# Patient Record
Sex: Female | Born: 1955
Health system: Southern US, Community
[De-identification: ages and names within clinical notes are randomized; demographics above are authoritative.]

## PROBLEM LIST (undated history)

## (undated) DIAGNOSIS — I10 Essential (primary) hypertension: Secondary | ICD-10-CM

## (undated) DIAGNOSIS — I219 Acute myocardial infarction, unspecified: Secondary | ICD-10-CM

## (undated) DIAGNOSIS — E785 Hyperlipidemia, unspecified: Secondary | ICD-10-CM

## (undated) DIAGNOSIS — F410 Panic disorder [episodic paroxysmal anxiety] without agoraphobia: Secondary | ICD-10-CM

## (undated) DIAGNOSIS — K219 Gastro-esophageal reflux disease without esophagitis: Secondary | ICD-10-CM

## (undated) DIAGNOSIS — F411 Generalized anxiety disorder: Secondary | ICD-10-CM

## (undated) DIAGNOSIS — Z720 Tobacco use: Secondary | ICD-10-CM

## (undated) HISTORY — DX: Acute myocardial infarction, unspecified: I21.9

## (undated) HISTORY — DX: Tobacco use: Z72.0

## (undated) HISTORY — PX: COLON SURGERY: SHX602

## (undated) HISTORY — DX: Generalized anxiety disorder: F41.1

## (undated) HISTORY — PX: TONSILLECTOMY: SUR1361

## (undated) HISTORY — DX: Essential (primary) hypertension: I10

## (undated) HISTORY — DX: Hyperlipidemia, unspecified: E78.5

## (undated) HISTORY — DX: Gastro-esophageal reflux disease without esophagitis: K21.9

## (undated) HISTORY — PX: APPENDECTOMY: SHX54

---

## 1998-09-05 ENCOUNTER — Other Ambulatory Visit: Admission: RE | Admit: 1998-09-05 | Discharge: 1998-09-05 | Payer: Self-pay | Admitting: *Deleted

## 2001-10-14 ENCOUNTER — Emergency Department (HOSPITAL_COMMUNITY): Admission: EM | Admit: 2001-10-14 | Discharge: 2001-10-14 | Payer: Self-pay | Admitting: Emergency Medicine

## 2001-10-14 ENCOUNTER — Encounter: Payer: Self-pay | Admitting: Emergency Medicine

## 2006-09-17 ENCOUNTER — Emergency Department (HOSPITAL_COMMUNITY): Admission: EM | Admit: 2006-09-17 | Discharge: 2006-09-17 | Payer: Self-pay | Admitting: Emergency Medicine

## 2006-09-30 ENCOUNTER — Ambulatory Visit (HOSPITAL_COMMUNITY): Admission: RE | Admit: 2006-09-30 | Discharge: 2006-09-30 | Payer: Self-pay | Admitting: Obstetrics and Gynecology

## 2006-10-01 ENCOUNTER — Encounter (INDEPENDENT_AMBULATORY_CARE_PROVIDER_SITE_OTHER): Payer: Self-pay | Admitting: *Deleted

## 2007-07-18 ENCOUNTER — Emergency Department (HOSPITAL_COMMUNITY): Admission: EM | Admit: 2007-07-18 | Discharge: 2007-07-18 | Payer: Self-pay | Admitting: Emergency Medicine

## 2009-07-23 ENCOUNTER — Emergency Department (HOSPITAL_COMMUNITY): Admission: EM | Admit: 2009-07-23 | Discharge: 2009-07-23 | Payer: Self-pay | Admitting: Emergency Medicine

## 2010-10-26 NOTE — Op Note (Signed)
NAMECYNTHIA, Danielle Morris                  ACCOUNT NO.:  [id number]   MEDICAL RECORD NO.:  [id number]          PATIENT TYPE:  AMB   LOCATION:  SDC                           FACILITY:  [facility]   PHYSICIAN:  Randye Lobo, M.D.   DATE OF BIRTH:  30-Aug-1955   DATE OF PROCEDURE:  09/30/2006  DATE OF DISCHARGE:                               OPERATIVE REPORT   PREOPERATIVE DIAGNOSIS:  Menometrorrhagia.   POSTOPERATIVE DIAGNOSIS:  Menometrorrhagia.   PROCEDURE:  Hysteroscopy, dilation and curettage.   SURGEON:  Conley Simmonds, MD   ANESTHESIA:  General endotracheal, paracervical block with 1% lidocaine.   IV FLUIDS:  500 mL Ringer's lactate.   ESTIMATED BLOOD LOSS:  Minimal.   URINE OUTPUT:  In and out catheterization prior to procedure.   COMPLICATIONS:  None.   INDICATIONS FOR PROCEDURE:  The patient is a 55 year old para 1  Caucasian female who presented with heavy vaginal bleeding.  The patient  was seen at [facility] on 09/17/2006 for vaginal bleeding of  approximately 2 months duration along with weakness.  The patient was  noted to have a hemoglobin of 8.1.  A urine pregnancy test was negative.  A pelvic ultrasound documented thickened endometrium with a small  anterior myometrial fibroid.  The right ovary was normal.  The left  ovary was not visualized.  The patient received a partial transfusion of  packed red blood cells secondary to her symptomatic anemia, however this  was discontinued during the transfusion due to her development of  abdominal pain.  The patient was sent to the office for further  evaluation and treatment.  In the office an endometrial biopsy was  performed which documented benign endometrium.  The patient was placed  on Provera therapy as she was a smoker.  The patient's bleeding did not  improve with Provera.  She presents now for a hysteroscopy with dilation  and curettage after risks, benefits, and alternatives are reviewed.   FINDINGS:   Examination under anesthesia revealed a small uterus with no  adnexal masses.  There is first-degree uterine prolapse.  There is a  left cervical polypoid projection which is on the left exocervix at the  3 o'clock position.  This appears to be consistent with an old  obstetrical laceration repair.   The uterus is sounded to 8 cm.   Hysteroscopy demonstrated clots in the endometrium along with thickened  endometrium.  The visualization was somewhat suboptimal although there  were no obvious polyps or fibroids appreciated.   SPECIMENS:  An endocervical sampling was sent separately from  endometrial sampling.   PROCEDURE:  The patient was reidentified in the preoperative hold area.  She did receive cefoxitin 1 gram IV for antibiotic prophylaxis.   In the operating room, general endotracheal anesthesia was induced and  the patient was placed in the dorsal lithotomy position.  The vagina and  perineum were sterilely prepped and a red rubber catheter was used to in-  and-out catheterize the bladder.  The patient was sterilely draped.   An exam under anesthesia was performed at the  findings are as noted  above.   A speculum was placed inside the vagina and a single-tooth tenaculum  placed in the anterior cervical lip.  A paracervical block was performed  with a total of 10 mL of 1% lidocaine.  The uterus was sounded.  The  cervix was then dilated to a number 21 Pratt dilator.  The hysteroscope  was then inserted through the cervical os to the level of the uterine  fundus and it was withdrawn slightly.  The hysteroscopy was performed  under  continuous infusion of sorbitol solution.  The findings were as  noted above.  The left tubal ostia could be visualized well but the  right tubal ostia could not be visualized well secondary to a clot.   The hysteroscope was removed and sharp curettage of the endocervix was  performed first and the specimen was sent to pathology.   Curettage of  the endometrium was then performed with both a smooth and a  serrated curette.  The hysteroscope was inserted and repeat hysteroscopy  was performed and there was still some thickened endometrium which was  removed with a polyp forceps and then with additional curettage with the  sharp and serrated curettes.   The hysteroscope was inserted one final time.  Hemostasis was noted be  good.  There were no remaining obvious lesions.  The hysteroscope was  therefore removed.   The endometrial curetting's were sent to pathology.   All of the vaginal instruments were removed.  Hemostasis was good.   The patient was cleansed of any remaining Betadine, awakened, and  extubated.  She was escorted to recovery room in stable and awake  condition.  There were no complications to the procedure.  All needle,  instrument and sponge counts correct.      Randye Lobo, M.D.  Electronically Signed     BES/MEDQ  D:  09/30/2006  T:  09/30/2006  Job:  [id number]

## 2013-05-26 ENCOUNTER — Encounter: Payer: Self-pay | Admitting: Family Medicine

## 2013-05-26 ENCOUNTER — Other Ambulatory Visit: Payer: Self-pay | Admitting: Physician Assistant

## 2013-05-26 NOTE — Telephone Encounter (Signed)
Medication refill for one time only.  Patient needs to be seen.  Letter sent for patient to call and schedule 

## 2013-06-22 ENCOUNTER — Ambulatory Visit (INDEPENDENT_AMBULATORY_CARE_PROVIDER_SITE_OTHER): Payer: BC Managed Care – HMO | Admitting: Physician Assistant

## 2013-06-22 ENCOUNTER — Encounter: Payer: Self-pay | Admitting: Physician Assistant

## 2013-06-22 VITALS — BP 162/100 | HR 72 | Temp 98.6°F | Resp 18 | Wt 161.0 lb

## 2013-06-22 DIAGNOSIS — I10 Essential (primary) hypertension: Secondary | ICD-10-CM | POA: Insufficient documentation

## 2013-06-22 MED ORDER — HYDROCHLOROTHIAZIDE 25 MG PO TABS: ORAL_TABLET | ORAL | Status: DC

## 2013-06-22 MED ORDER — LISINOPRIL 10 MG PO TABS: 10.0000 mg | ORAL_TABLET | Freq: Every day | ORAL | Status: DC

## 2013-06-22 NOTE — Progress Notes (Signed)
    Patient ID: Danielle Morris MRN: [id number], DOB: April 20, 1956, 58 y.o. Date of Encounter: 06/22/2013, 3:49 PM    Chief Complaint:  Chief Complaint  Patient presents with  . BP check / med RF     HPI: 58 y.o. year old white female is here for followup of hypertension. Her last office visit with me was actually back January 17th 2014.  She reports that she is still taking HCTZ 25 mg daily. At one point I have recommended she add potassium. She reports that she is no longer taking this. Says that she just make sure to eat bananas et Ronney Asters.  She says that she occasionally checks her blood pressure but mostly does this when she feels upset and stressed and thinks of her blood pressure may be high. Says that when she checks it it is sometimes high. However, she is not routinely checking this to know how her blood pressure is running on a routine basis.  Says that she is in the process of moving to a new house. He has been busy packing [name]. No other complaints today.     Home Meds: See attached medication section for any medications that were entered at today's visit. The computer does not put those onto this list.The following list is a list of meds entered prior to today's visit.   No current outpatient prescriptions on file prior to visit.   No current facility-administered medications on file prior to visit.    Allergies:  Allergies  Allergen Reactions  . Chantix [Varenicline] Nausea Only and Anxiety      Review of Systems: See HPI for pertinent ROS. All other ROS negative.    Physical Exam: Blood pressure 162/100, pulse 72, temperature 98.6 F (37 C), temperature source Oral, resp. rate 18, weight 161 lb (73.029 kg)., There is no height on file to calculate BMI. Checks her blood pressure myself. The right and getting 152/100. General:  WNWD WF. Appears in no acute distress. Neck: Supple. No thyromegaly. No lymphadenopathy. No carotid bruit. Lungs: Clear bilaterally to  auscultation without wheezes, rales, or rhonchi. Breathing is unlabored. Heart: Regular rhythm. No murmurs, rubs, or gallops. Msk:  Strength and tone normal for age. Extremities/Skin: Warm and dry. No clubbing or cyanosis. No edema. No rashes or suspicious lesions. Neuro: Alert and oriented X 3. Moves all extremities spontaneously. Gait is normal. CNII-XII grossly in tact. Psych:  Responds to questions appropriately with a normal affect.     ASSESSMENT AND PLAN:  58 y.o. year old female with  1. Hypertension Blood pressure is suboptimal. We'll continue HCTZ 25 mg daily. Add lisinopril 10 mg daily. She is to schedule followup office visit in 2-3 weeks. He is to take these blood pressure medications that morning prior to her visit. At that visit we will recheck blood pressure as well as check BMET. Once this followup is done then I will send her and refills to last 6-12 months. I have discussed with her in the past scheduling a complete physical exam. Discussed this today again. She defers at this time. - lisinopril (PRINIVIL,ZESTRIL) 10 MG tablet; Take 1 tablet (10 mg total) by mouth daily.  Dispense: 30 tablet; Refill: 0 - hydrochlorothiazide (HYDRODIURIL) 25 MG tablet; TAKE 1 TABLET EVERY MORNING  Dispense: 30 tablet; Refill: 0   Signed, [address] [place], [place], [facility] 06/22/2013 3:49 PM

## 2013-08-02 ENCOUNTER — Other Ambulatory Visit: Payer: Self-pay | Admitting: Physician Assistant

## 2013-08-02 DIAGNOSIS — I10 Essential (primary) hypertension: Secondary | ICD-10-CM

## 2013-08-02 NOTE — Telephone Encounter (Signed)
Medication refilled per protocol. 

## 2013-08-02 NOTE — Telephone Encounter (Signed)
Medication refilled per protocol. Pt has upcoming follow up appt

## 2013-08-06 ENCOUNTER — Ambulatory Visit: Payer: BC Managed Care – HMO | Admitting: Family Medicine

## 2013-08-09 ENCOUNTER — Ambulatory Visit (INDEPENDENT_AMBULATORY_CARE_PROVIDER_SITE_OTHER): Payer: BC Managed Care – HMO | Admitting: Physician Assistant

## 2013-08-09 ENCOUNTER — Encounter: Payer: Self-pay | Admitting: Physician Assistant

## 2013-08-09 VITALS — BP 150/100 | HR 80 | Temp 98.7°F | Resp 18 | Ht 67.0 in | Wt 158.0 lb

## 2013-08-09 DIAGNOSIS — R6883 Chills (without fever): Secondary | ICD-10-CM

## 2013-08-09 DIAGNOSIS — I10 Essential (primary) hypertension: Secondary | ICD-10-CM

## 2013-08-09 LAB — INFLUENZA A AND B
Inflenza A Ag: NEGATIVE
Influenza B Ag: NEGATIVE

## 2013-08-09 MED ORDER — AMLODIPINE BESYLATE 5 MG PO TABS: 5.0000 mg | ORAL_TABLET | Freq: Every day | ORAL | Status: DC
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
## 2013-08-10 ENCOUNTER — Encounter (HOSPITAL_COMMUNITY): Payer: Self-pay | Admitting: Emergency Medicine

## 2013-08-10 ENCOUNTER — Emergency Department (HOSPITAL_COMMUNITY)
Admission: EM | Admit: 2013-08-10 | Discharge: 2013-08-10 | Disposition: A | Discharge status: Home / Self Care | Payer: BC Managed Care – PPO | Attending: Emergency Medicine | Admitting: Emergency Medicine

## 2013-08-10 DIAGNOSIS — F17200 Nicotine dependence, unspecified, uncomplicated: Secondary | ICD-10-CM | POA: Insufficient documentation

## 2013-08-10 DIAGNOSIS — I10 Essential (primary) hypertension: Secondary | ICD-10-CM | POA: Insufficient documentation

## 2013-08-10 DIAGNOSIS — T7840XA Allergy, unspecified, initial encounter: Secondary | ICD-10-CM

## 2013-08-10 DIAGNOSIS — F411 Generalized anxiety disorder: Secondary | ICD-10-CM | POA: Insufficient documentation

## 2013-08-10 DIAGNOSIS — T465X5A Adverse effect of other antihypertensive drugs, initial encounter: Secondary | ICD-10-CM | POA: Insufficient documentation

## 2013-08-10 DIAGNOSIS — Z79899 Other long term (current) drug therapy: Secondary | ICD-10-CM | POA: Insufficient documentation

## 2013-08-10 DIAGNOSIS — R209 Unspecified disturbances of skin sensation: Secondary | ICD-10-CM | POA: Insufficient documentation

## 2013-08-10 LAB — CBC WITH DIFFERENTIAL/PLATELET
BASOS ABS: 0 10*3/uL (ref 0.0–0.1)
BASOS PCT: 1 % (ref 0–1)
EOS ABS: 0.1 10*3/uL (ref 0.0–0.7)
Eosinophils Relative: 2 % (ref 0–5)
HCT: 45.7 % (ref 36.0–46.0)
Hemoglobin: 15.8 g/dL — ABNORMAL HIGH (ref 12.0–15.0)
LYMPHS ABS: 1.2 10*3/uL (ref 0.7–4.0)
Lymphocytes Relative: 31 % (ref 12–46)
MCH: 30.7 pg (ref 26.0–34.0)
MCHC: 34.6 g/dL (ref 30.0–36.0)
MCV: 88.7 fL (ref 78.0–100.0)
Monocytes Absolute: 0.4 10*3/uL (ref 0.1–1.0)
Monocytes Relative: 11 % (ref 3–12)
Neutro Abs: 2.3 10*3/uL (ref 1.7–7.7)
Neutrophils Relative %: 55 % (ref 43–77)
PLATELETS: 212 10*3/uL (ref 150–400)
RBC: 5.15 MIL/uL — AB (ref 3.87–5.11)
RDW: 13.5 % (ref 11.5–15.5)
WBC: 4 10*3/uL (ref 4.0–10.5)

## 2013-08-10 LAB — URINALYSIS, ROUTINE W REFLEX MICROSCOPIC
Bilirubin Urine: NEGATIVE
GLUCOSE, UA: NEGATIVE mg/dL
Hgb urine dipstick: NEGATIVE
KETONES UR: NEGATIVE mg/dL
Leukocytes, UA: NEGATIVE
Nitrite: NEGATIVE
PH: 5.5 (ref 5.0–8.0)
Protein, ur: NEGATIVE mg/dL
Specific Gravity, Urine: 1.015 (ref 1.005–1.030)
Urobilinogen, UA: 0.2 mg/dL (ref 0.0–1.0)

## 2013-08-10 LAB — COMPREHENSIVE METABOLIC PANEL
ALBUMIN: 3.6 g/dL (ref 3.5–5.2)
ALK PHOS: 88 U/L (ref 39–117)
ALT: 13 U/L (ref 0–35)
AST: 19 U/L (ref 0–37)
BUN: 10 mg/dL (ref 6–23)
CO2: 30 mEq/L (ref 19–32)
Calcium: 9.5 mg/dL (ref 8.4–10.5)
Chloride: 96 mEq/L (ref 96–112)
Creatinine, Ser: 0.83 mg/dL (ref 0.50–1.10)
GFR calc Af Amer: >90 mL/min (ref >90)
GFR calc non Af Amer: 78 mL/min — ABNORMAL LOW (ref >90)
Glucose, Bld: 112 mg/dL — ABNORMAL HIGH (ref 70–99)
POTASSIUM: 3.9 meq/L (ref 3.7–5.3)
SODIUM: 136 meq/L — AB (ref 137–147)
Total Bilirubin: 0.3 mg/dL (ref 0.3–1.2)
Total Protein: 8.1 g/dL (ref 6.0–8.3)

## 2013-08-10 MED ORDER — SODIUM CHLORIDE 0.9 % IV BOLUS (SEPSIS)
1000.0000 mL | Freq: Once | INTRAVENOUS | Status: AC
  Administered 2013-08-10: 1000 mL via INTRAVENOUS

## 2013-08-10 MED ORDER — LORAZEPAM 2 MG/ML IJ SOLN
1.0000 mg | Freq: Once | INTRAMUSCULAR | Status: AC
  Administered 2013-08-10: 1 mg via INTRAVENOUS
  Filled 2013-08-10: qty 1

## 2013-08-10 NOTE — ED Provider Notes (Signed)
CSN: [id number]     Arrival date & time 08/10/13  1849 History   First MD Initiated Contact with Patient 08/10/13 1901     No chief complaint on file.    HPI  Patient presents with generalized numbness, tingling, flushed sensation.  Symptoms began approximately in one hour prior to my evaluation. Yesterday patient had a somewhat similar, though significantly less pronounced experienced.  In the interim she was in her usual state of health. Since onset and is been waxing/waning, though increasingly severe, with associated anxiety, nausea. No focal pain, no dyspnea, no fever. No confusion, no disorientation, there is mild lightheadedness. Patient began a new antihypertensive medication yesterday.  Today was the second dose.  The medication is amlodipine.   Past Medical History  Diagnosis Date  . Hypertension    History reviewed. No pertinent past surgical history. History reviewed. No pertinent family history. History  Substance Use Topics  . Smoking status: Current Some Day Smoker  . Smokeless tobacco: Never Used     Comment: uses E-cigs  . Alcohol Use: No   OB History   Grav Para Term Preterm Abortions TAB SAB Ect Mult Living                 Review of Systems  Constitutional:       Per HPI, otherwise negative  HENT:       Per HPI, otherwise negative  Respiratory:       Per HPI, otherwise negative  Cardiovascular:       Per HPI, otherwise negative  Gastrointestinal: Negative for vomiting.  Endocrine:       Negative aside from HPI  Genitourinary:       Neg aside from HPI   Musculoskeletal:       Per HPI, otherwise negative  Skin: Negative.   Neurological: Negative for syncope.      Allergies  Chantix  Home Medications   Current Outpatient Rx  Name  Route  Sig  Dispense  Refill  . amLODipine (NORVASC) 5 MG tablet   Oral   Take 5 mg by mouth daily.         Marland Kitchen docusate sodium (STOOL SOFTENER) 100 MG capsule   Oral   Take 100 mg by mouth daily as needed  for mild constipation.         Marland Kitchen doxylamine, Sleep, (UNISOM) 25 MG tablet   Oral   Take 25 mg by mouth at bedtime as needed.         . hydrochlorothiazide (HYDRODIURIL) 25 MG tablet   Oral   Take 25 mg by mouth daily.          BP 149/103  Pulse 108  Temp(Src) 98.5 F (36.9 C) (Oral)  Resp 18  Ht 5\' 7"  (1.702 m)  Wt 155 lb (70.308 kg)  BMI 24.27 kg/m2  SpO2 100% Physical Exam  Nursing note and vitals reviewed. Constitutional: She is oriented to person, place, and time. She appears well-developed and well-nourished. She is active.  HENT:  Head: Normocephalic and atraumatic.  Eyes: Conjunctivae and EOM are normal.  Cardiovascular: Normal rate and regular rhythm.   Pulmonary/Chest: Effort normal and breath sounds normal. No stridor. No respiratory distress.  Abdominal: She exhibits no distension.  Musculoskeletal: She exhibits no edema.  Neurological: She is alert and oriented to person, place, and time. She displays no tremor. No cranial nerve deficit. She exhibits normal muscle tone.  Skin: Skin is warm and dry.  Psychiatric: Her mood  appears anxious. Her speech is rapid and/or pressured. Cognition and memory are not impaired.    ED Course  Procedures (including critical care time) Labs Review Labs Reviewed  CBC WITH DIFFERENTIAL - Abnormal; Notable for the following:    RBC 5.15 (*)    Hemoglobin 15.8 (*)    All other components within normal limits  COMPREHENSIVE METABOLIC PANEL  URINALYSIS, ROUTINE W REFLEX MICROSCOPIC    9:13 PM Patient substantially improved, no active complaints, no dyspnea. We had a conversation about the likely precipitant.  Patient will follow up with primary care  MDM   This patient presents with anxiousness, whole body discomfort, and on initial exam is very uncomfortable appearing, but in no distress, with no evidence of respiratory compromise, systemic infection or anaphylaxis.  Patient procedure with fluids, Ativan.  Labs, vitals  are reassuring.  Patient was appropriate for discharge with outpatient management.  She will contact her primary care physician tomorrow.    Carmin Muskrat, MD 08/10/13 2114

## 2013-08-10 NOTE — Discharge Instructions (Signed)
As discussed, it is important that you follow up with her primary care physician tomorrow to ensure that you are on an appropriate medication regimen.  Please be sure to return here for any concerning changes in your condition.

## 2013-08-10 NOTE — ED Notes (Signed)
Took new med for bp 4 pm.    20 min ago began feeling "numb all over, "  Nausea  , flushed feeling all over,

## 2013-08-11 ENCOUNTER — Telehealth: Payer: Self-pay | Admitting: Physician Assistant

## 2013-08-11 DIAGNOSIS — I10 Essential (primary) hypertension: Secondary | ICD-10-CM

## 2013-08-11 MED ORDER — LISINOPRIL 10 MG PO TABS: 10.0000 mg | ORAL_TABLET | Freq: Every day | ORAL | Status: DC

## 2013-08-11 NOTE — Telephone Encounter (Signed)
Took tablet about 630pm Monday.  Had some back and chest discomfort.  Took about 430pm yesterday, then about 7PM got a numb feeling in left leg, unable to stand up.  Got very hot and tingling.  Became incoherent.   Happened again on way to hospital.  Please advise about BP med.

## 2013-08-11 NOTE — Telephone Encounter (Signed)
Allergy and med list updated.  Pt was called and made aware of new med.  Will be keeping follow up appt.  Told her to call us ASAP if has any problem with new medication.

## 2013-08-11 NOTE — Telephone Encounter (Signed)
Call back number is [phone] The medication amlodipine 5 mg tablets made her have allergic reaction she was rushed to the hospital last night and she is wanting to make [facility] aware.

## 2013-08-11 NOTE — Telephone Encounter (Signed)
Pt would also like for you to call her

## 2013-08-11 NOTE — Telephone Encounter (Signed)
Kim, Please go  into her chart and add to her allergy list: Norvasc--- generalized numb, tingly, flushed sensation---I got ER note--this is what they documented as reaction--  Also, Tell pt that in place of Norvasc, will Rx: Lisinopril 10mg  one po QD # 30 + 0 Keep appointment to return in 2 weeks for repeat blood pressure check on new BP medication.

## 2013-08-25 ENCOUNTER — Encounter: Payer: Self-pay | Admitting: Physician Assistant

## 2013-08-25 ENCOUNTER — Ambulatory Visit (INDEPENDENT_AMBULATORY_CARE_PROVIDER_SITE_OTHER): Payer: BC Managed Care – HMO | Admitting: Physician Assistant

## 2013-08-25 VITALS — BP 142/100 | HR 76 | Temp 97.5°F | Resp 18 | Wt 161.0 lb

## 2013-08-25 DIAGNOSIS — F17200 Nicotine dependence, unspecified, uncomplicated: Secondary | ICD-10-CM | POA: Insufficient documentation

## 2013-08-25 DIAGNOSIS — I10 Essential (primary) hypertension: Secondary | ICD-10-CM

## 2013-08-25 MED ORDER — LOSARTAN POTASSIUM 50 MG PO TABS: 50.0000 mg | ORAL_TABLET | Freq: Every day | ORAL | Status: DC
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
## 2013-08-30 ENCOUNTER — Other Ambulatory Visit: Payer: Self-pay | Admitting: Physician Assistant

## 2013-08-30 NOTE — Telephone Encounter (Signed)
Medication refilled per protocol. 

## 2013-09-08 ENCOUNTER — Encounter: Payer: Self-pay | Admitting: Physician Assistant

## 2013-09-08 ENCOUNTER — Ambulatory Visit (INDEPENDENT_AMBULATORY_CARE_PROVIDER_SITE_OTHER): Payer: BC Managed Care – HMO | Admitting: Physician Assistant

## 2013-09-08 VITALS — BP 124/78 | HR 68 | Temp 97.8°F | Resp 18 | Wt 164.0 lb

## 2013-09-08 DIAGNOSIS — F17200 Nicotine dependence, unspecified, uncomplicated: Secondary | ICD-10-CM

## 2013-09-08 DIAGNOSIS — I10 Essential (primary) hypertension: Secondary | ICD-10-CM

## 2013-09-08 MED ORDER — LOSARTAN POTASSIUM 50 MG PO TABS: 50.0000 mg | ORAL_TABLET | Freq: Every day | ORAL | Status: DC

## 2013-09-08 MED ORDER — HYDROCHLOROTHIAZIDE 25 MG PO TABS: 25.0000 mg | ORAL_TABLET | Freq: Every day | ORAL | Status: DC

## 2013-09-08 NOTE — Progress Notes (Signed)
Patient ID: Danielle Morris MRN: [id number], DOB: Mar 29, 1956, 58 y.o. Date of Encounter: 09/08/2013, 4:24 PM    Chief Complaint:  Chief Complaint  Patient presents with  . 2 week follow up     HPI: 58 y.o. year old white female followup hypertension.  Last visit with me was 08/25/13. Prior to that she had problems with adverse effects to other blood pressure medicines that we added. Had reaction to Norvasc which caused her to go to the ER on 08/10/13. Then tried lisinopril but she was afraid she was having reaction to that so stopped it as well. Ultimately, at her last visit with me we continued HCTZ 25 and added losartan 50 mg. Today she states that she is taking both of these medications as directed and is having no adverse effects.   Home Meds: See attached medication section for any medications that were entered at today's visit. The computer does not put those onto this list.The following list is a list of meds entered prior to today's visit.   Current Outpatient Prescriptions on File Prior to Visit  Medication Sig Dispense Refill  . docusate sodium (STOOL SOFTENER) 100 MG capsule Take 100 mg by mouth daily as needed for mild constipation.      Marland Kitchen doxylamine, Sleep, (UNISOM) 25 MG tablet Take 25 mg by mouth at bedtime as needed.       No current facility-administered medications on file prior to visit.    Allergies:  Allergies  Allergen Reactions  . Amlodipine Shortness Of Breath, Anxiety and Palpitations  . Chantix [Varenicline] Nausea Only and Anxiety      Review of Systems: See HPI for pertinent ROS. All other ROS negative.    Physical Exam: Blood pressure 124/78, pulse 68, temperature 97.8 F (36.6 C), temperature source Oral, resp. rate 18, weight 164 lb (74.39 kg)., Body mass index is 25.68 kg/(m^2). General:  WNWD WF. Appears in no acute distress. Neck: Supple. No thyromegaly. No lymphadenopathy. No carotid bruits. Lungs: Clear bilaterally to auscultation  without wheezes, rales, or rhonchi. Breathing is unlabored. Heart: Regular rhythm. No murmurs, rubs, or gallops. Msk:  Strength and tone normal for age. Extremities/Skin: Warm and dry.  No edema.  Neuro: Alert and oriented X 3. Moves all extremities spontaneously. Gait is normal. CNII-XII grossly in tact. Psych:  Responds to questions appropriately with a normal affect.     ASSESSMENT AND PLAN:  58 y.o. year old female with  1. Hypertension Blood Pressure now at goal. Continue current medications. Check labs monitor. - losartan (COZAAR) 50 MG tablet; Take 1 tablet (50 mg total) by mouth daily.  Dispense: 30 tablet; Refill: 5 - hydrochlorothiazide (HYDRODIURIL) 25 MG tablet; Take 1 tablet (25 mg total) by mouth daily.  Dispense: 30 tablet; Refill: 5 - BASIC METABOLIC PANEL WITH GFR  2. Smoker She says that she is just about quit smoking. Says that she had one cigarette yesterday and so far none today.  3. Anxiety: Recent visit with me she was upset and reported feeling overwhelmed and anxiety. However she felt that she did not need medicine and we will monitor this. She says that this is all improved and she has been feeling fine in regards to her mood and anxiety.  Today I also reviewed the fact that we needed to update her preventive care. Discussed scheduling a complete physical exam. She wants to wait and have this at her next six-month appointment. Told her to  schedule that appointment as a  physical and come fasting.   Marin Olp [place], [place], [facility] 09/08/2013 4:24 PM

## 2013-09-09 LAB — BASIC METABOLIC PANEL WITH GFR
BUN: 8 mg/dL (ref 6–23)
CHLORIDE: 99 meq/L (ref 96–112)
CO2: 30 meq/L (ref 19–32)
Calcium: 9.7 mg/dL (ref 8.4–10.5)
Creat: 0.76 mg/dL (ref 0.50–1.10)
GFR, Est African American: >89 mL/min
GFR, Est Non African American: 89 mL/min
Glucose, Bld: 94 mg/dL (ref 70–99)
Potassium: 4.9 mEq/L (ref 3.5–5.3)
Sodium: 136 mEq/L (ref 135–145)

## 2013-09-22 ENCOUNTER — Other Ambulatory Visit: Payer: Self-pay | Admitting: Physician Assistant

## 2013-09-22 DIAGNOSIS — I10 Essential (primary) hypertension: Secondary | ICD-10-CM

## 2013-09-23 ENCOUNTER — Emergency Department (HOSPITAL_COMMUNITY)
Admission: EM | Admit: 2013-09-23 | Discharge: 2013-09-23 | Disposition: A | Discharge status: Home / Self Care | Payer: BC Managed Care – PPO | Attending: Emergency Medicine | Admitting: Emergency Medicine

## 2013-09-23 ENCOUNTER — Emergency Department (HOSPITAL_COMMUNITY): Payer: BC Managed Care – PPO

## 2013-09-23 ENCOUNTER — Encounter (HOSPITAL_COMMUNITY): Payer: Self-pay | Admitting: Emergency Medicine

## 2013-09-23 DIAGNOSIS — E876 Hypokalemia: Secondary | ICD-10-CM

## 2013-09-23 DIAGNOSIS — F411 Generalized anxiety disorder: Secondary | ICD-10-CM | POA: Insufficient documentation

## 2013-09-23 DIAGNOSIS — D751 Secondary polycythemia: Secondary | ICD-10-CM

## 2013-09-23 DIAGNOSIS — E871 Hypo-osmolality and hyponatremia: Secondary | ICD-10-CM

## 2013-09-23 DIAGNOSIS — E878 Other disorders of electrolyte and fluid balance, not elsewhere classified: Secondary | ICD-10-CM

## 2013-09-23 DIAGNOSIS — D45 Polycythemia vera: Secondary | ICD-10-CM | POA: Insufficient documentation

## 2013-09-23 DIAGNOSIS — F17200 Nicotine dependence, unspecified, uncomplicated: Secondary | ICD-10-CM | POA: Insufficient documentation

## 2013-09-23 DIAGNOSIS — I10 Essential (primary) hypertension: Secondary | ICD-10-CM | POA: Insufficient documentation

## 2013-09-23 DIAGNOSIS — Z79899 Other long term (current) drug therapy: Secondary | ICD-10-CM | POA: Insufficient documentation

## 2013-09-23 DIAGNOSIS — R252 Cramp and spasm: Secondary | ICD-10-CM

## 2013-09-23 LAB — COMPREHENSIVE METABOLIC PANEL
ALBUMIN: 4.1 g/dL (ref 3.5–5.2)
ALK PHOS: 102 U/L (ref 39–117)
ALT: 12 U/L (ref 0–35)
AST: 20 U/L (ref 0–37)
BILIRUBIN TOTAL: 0.4 mg/dL (ref 0.3–1.2)
BUN: 10 mg/dL (ref 6–23)
CHLORIDE: 92 meq/L — AB (ref 96–112)
CO2: 26 mEq/L (ref 19–32)
Calcium: 9.7 mg/dL (ref 8.4–10.5)
Creatinine, Ser: 0.77 mg/dL (ref 0.50–1.10)
GFR calc Af Amer: >90 mL/min (ref >90)
Glucose, Bld: 148 mg/dL — ABNORMAL HIGH (ref 70–99)
POTASSIUM: 3.2 meq/L — AB (ref 3.7–5.3)
Sodium: 135 mEq/L — ABNORMAL LOW (ref 137–147)
Total Protein: 8.8 g/dL — ABNORMAL HIGH (ref 6.0–8.3)

## 2013-09-23 LAB — DIFFERENTIAL
BASOS ABS: 0 10*3/uL (ref 0.0–0.1)
Basophils Relative: 0 % (ref 0–1)
Eosinophils Absolute: 0.3 10*3/uL (ref 0.0–0.7)
Eosinophils Relative: 3 % (ref 0–5)
Lymphocytes Relative: 43 % (ref 12–46)
Lymphs Abs: 3.8 10*3/uL (ref 0.7–4.0)
Monocytes Absolute: 0.5 10*3/uL (ref 0.1–1.0)
Monocytes Relative: 6 % (ref 3–12)
NEUTROS ABS: 4.4 10*3/uL (ref 1.7–7.7)
NEUTROS PCT: 48 % (ref 43–77)

## 2013-09-23 LAB — APTT: APTT: 29 s (ref 24–37)

## 2013-09-23 LAB — CBC
HCT: 47 % — ABNORMAL HIGH (ref 36.0–46.0)
Hemoglobin: 16 g/dL — ABNORMAL HIGH (ref 12.0–15.0)
MCH: 30.2 pg (ref 26.0–34.0)
MCHC: 34 g/dL (ref 30.0–36.0)
MCV: 88.8 fL (ref 78.0–100.0)
Platelets: 343 10*3/uL (ref 150–400)
RBC: 5.29 MIL/uL — ABNORMAL HIGH (ref 3.87–5.11)
RDW: 14.2 % (ref 11.5–15.5)
WBC: 9 10*3/uL (ref 4.0–10.5)

## 2013-09-23 LAB — PROTIME-INR
INR: 0.95 (ref 0.00–1.49)
Prothrombin Time: 12.5 seconds (ref 11.6–15.2)

## 2013-09-23 LAB — TROPONIN I: Troponin I: <0.3 ng/mL (ref <0.30)

## 2013-09-23 LAB — ETHANOL

## 2013-09-23 MED ORDER — POTASSIUM CHLORIDE CRYS ER 20 MEQ PO TBCR: 20.0000 meq | EXTENDED_RELEASE_TABLET | Freq: Two times a day (BID) | ORAL | Status: DC

## 2013-09-23 MED ORDER — LORAZEPAM 2 MG/ML IJ SOLN
1.0000 mg | Freq: Once | INTRAMUSCULAR | Status: AC
  Administered 2013-09-23: 1 mg via INTRAVENOUS
  Filled 2013-09-23: qty 1

## 2013-09-23 MED ORDER — POTASSIUM CHLORIDE CRYS ER 20 MEQ PO TBCR
40.0000 meq | EXTENDED_RELEASE_TABLET | Freq: Once | ORAL | Status: AC
  Administered 2013-09-23: 40 meq via ORAL
  Filled 2013-09-23: qty 2

## 2013-09-23 NOTE — Discharge Instructions (Signed)
Stop the HCTZ and just continue on the losartan. Take the potassium pills until gone. Call [place] tomorrow to let [facility] know about your ED visit. You will probably need something other than fluid pills to help control your blood pressure. YOU NEED TO STOP SMOKING!!    Hypokalemia Hypokalemia means that the amount of potassium in the blood is lower than normal.Potassium is a chemical, called an electrolyte, that helps regulate the amount of fluid in the body. It also stimulates muscle contraction and helps nerves function properly.Most of the body's potassium is inside of cells, and only a very small amount is in the blood. Because the amount in the blood is so small, minor changes can be life-threatening. CAUSES  Antibiotics.  Diarrhea or vomiting.  Using laxatives too much, which can cause diarrhea.  Chronic kidney disease.  Water pills (diuretics).  Eating disorders (bulimia).  Low magnesium level.  Sweating a lot. SIGNS AND SYMPTOMS  Weakness.  Constipation.  Fatigue.  Muscle cramps.  Mental confusion.  Skipped heartbeats or irregular heartbeat (palpitations).  Tingling or numbness. DIAGNOSIS  Your health care provider can diagnose hypokalemia with blood tests. In addition to checking your potassium level, your health care provider may also check other lab tests. TREATMENT Hypokalemia can be treated with potassium supplements taken by mouth or adjustments in your current medicines. If your potassium level is very low, you may need to get potassium through a vein (IV) and be monitored in the hospital. A diet high in potassium is also helpful. Foods high in potassium are:  Nuts, such as peanuts and pistachios.  Seeds, such as sunflower seeds and pumpkin seeds.  Peas, lentils, and lima beans.  Whole grain and bran cereals and breads.  Fresh fruit and vegetables, such as apricots, avocado, bananas, cantaloupe, kiwi, oranges, tomatoes,  asparagus, and potatoes.  Orange and tomato juices.  Red meats.  Fruit yogurt. HOME CARE INSTRUCTIONS  Take all medicines as prescribed by your health care provider.  Maintain a healthy diet by including nutritious food, such as fruits, vegetables, nuts, whole grains, and lean meats.  If you are taking a laxative, be sure to follow the directions on the label. SEEK MEDICAL CARE IF:  Your weakness gets worse.  You feel your heart pounding or racing.  You are vomiting or having diarrhea.  You are diabetic and having trouble keeping your blood glucose in the normal range. SEEK IMMEDIATE MEDICAL CARE IF:  You have chest pain, shortness of breath, or dizziness.  You are vomiting or having diarrhea for more than 2 days.  You faint. MAKE SURE YOU:   Understand these instructions.  Will watch your condition.  Will get help right away if you are not doing well or get worse. Document Released: 05/27/2005 Document Revised: 03/17/2013 Document Reviewed: 11/27/2012 [facility] Patient Information 2014 [place].  Muscle Cramps and Spasms Muscle cramps and spasms occur when a muscle or muscles tighten and you have no control over this tightening (involuntary muscle contraction). They are a common problem and can develop in any muscle. The most common place is in the calf muscles of the leg. Both muscle cramps and muscle spasms are involuntary muscle contractions, but they also have differences:   Muscle cramps are sporadic and painful. They may last a few seconds to a quarter of an hour. Muscle cramps are often more forceful and last longer than muscle spasms.  Muscle spasms may or may not be painful. They may also last  just a few seconds or much longer. CAUSES  It is uncommon for cramps or spasms to be due to a serious underlying problem. In many cases, the cause of cramps or spasms is unknown. Some common causes are:   Overexertion.   Overuse from repetitive motions (doing  the same thing over and over).   Remaining in a certain position for a long period of time.   Improper preparation, form, or technique while performing a sport or activity.   Dehydration.   Injury.   Side effects of some medicines.   Abnormally low levels of the salts and ions in your blood (electrolytes), especially potassium and calcium. This could happen if you are taking water pills (diuretics) or you are pregnant.  Some underlying medical problems can make it more likely to develop cramps or spasms. These include, but are not limited to:   Diabetes.   Parkinson disease.   Hormone disorders, such as thyroid problems.   Alcohol abuse.   Diseases specific to muscles, joints, and bones.   Blood vessel disease where not enough blood is getting to the muscles.  HOME CARE INSTRUCTIONS   Stay well hydrated. Drink enough water and fluids to keep your urine clear or pale yellow.  It may be helpful to massage, stretch, and relax the affected muscle.  For tight or tense muscles, use a warm towel, heating pad, or hot shower water directed to the affected area.  If you are sore or have pain after a cramp or spasm, applying ice to the affected area may relieve discomfort.  Put ice in a plastic bag.  Place a towel between your skin and the bag.  Leave the ice on for 15-20 minutes, 03-04 times a day.  Medicines used to treat a known cause of cramps or spasms may help reduce their frequency or severity. Only take over-the-counter or prescription medicines as directed by your caregiver. SEEK MEDICAL CARE IF:  Your cramps or spasms get more severe, more frequent, or do not improve over time.  MAKE SURE YOU:   Understand these instructions.  Will watch your condition.  Will get help right away if you are not doing well or get worse. Document Released: 11/16/2001 Document Revised: 09/21/2012 Document Reviewed: 05/13/2012 [facility] Patient Information 2014 [place],  [place].

## 2013-09-23 NOTE — ED Notes (Signed)
Per pt's husband, pt was at home, had just finished eating supper and states she smoked a cigarette, and told her husband that she had sudden onset of right side numbness.  Pt states it "moves all over, and keeps changing"  When pt questioned about numbness now, pt denies the numbness to her right side.  Pt states she has not felt very good for a couple of weeks.

## 2013-09-23 NOTE — Telephone Encounter (Signed)
Medication refilled per protocol. 

## 2013-09-23 NOTE — ED Provider Notes (Signed)
CSN: [id number]     Arrival date & time 09/23/13  1921 History   First MD Initiated Contact with Patient 09/23/13 1925     No chief complaint on file.    (Consider location/radiation/quality/duration/timing/severity/associated sxs/prior Treatment) HPI Patient reports her blood pressure has been hard to control since about December. She states her medications have been changed several times trying to control her blood pressure. She states her medicines were last changed 2 weeks ago to losartan/HCTZ. She states she ate dinner tonight. She and her husband went outside on the porch to smoke. She states she smoked 3 cigarettes and then about 7 PM she started feeling bad. She states she feels weak. She initially had numbness on the right side of her body however the numbness started moving around her body. She states she felt dizzy and weak. She denied headache, chest pain, shortness of breath, nausea, vomiting, or abdominal pain. She complains of cramping all over her body. She denies having a spinning feeling. She states she felt weak like she was going to pass out and she got numb. She states she almost passed out. Patient was able to walk to the car. Patient states she had a history of panic attacks in however she states they just make her get nervous when she goes home. She states she was treated for anxiety many years ago but has not been on medication for anxiety for at least 15 years. She states the last time she felt this way was with a prior medication change.  PCP Prince Rome  Past Medical History  Diagnosis Date  . Hypertension    History reviewed. No pertinent past surgical history. No family history on file. History  Substance Use Topics  . Smoking status: Current Some Day Smoker  . Smokeless tobacco: Never Used     Comment: uses E-cigs  . Alcohol Use: No  smokes 1 ppd Unemployed Lives at home Lives with spouse  OB History   Grav Para Term Preterm Abortions TAB SAB  Ect Mult Living                 Review of Systems  All other systems reviewed and are negative.     Allergies  Amlodipine and Chantix  Home Medications   Prior to Admission medications   Medication Sig Start Date End Date Taking? Authorizing Provider  docusate sodium (STOOL SOFTENER) 100 MG capsule Take 100 mg by mouth daily as needed for mild constipation.    Historical Provider, MD  doxylamine, Sleep, (UNISOM) 25 MG tablet Take 25 mg by mouth at bedtime as needed.    Historical Provider, MD  hydrochlorothiazide (HYDRODIURIL) 25 MG tablet Take 1 tablet (25 mg total) by mouth daily. 09/08/13   Lonie Peak Dixon, PA-C  losartan (COZAAR) 50 MG tablet TAKE 1 TABLET (50 MG TOTAL) BY MOUTH DAILY.    Mary B Dixon, PA-C   BP 158/101  Temp(Src) 97.9 F (36.6 C) (Oral)  Resp 16  SpO2 100%  Vital signs normal except hypertension   Physical Exam  Nursing note and vitals reviewed. Constitutional: She is oriented to person, place, and time. She appears well-developed and well-nourished.  Non-toxic appearance. She does not appear ill. No distress.  HENT:  Head: Normocephalic and atraumatic.  Right Ear: External ear normal.  Left Ear: External ear normal.  Nose: Nose normal. No mucosal edema or rhinorrhea.  Mouth/Throat: Oropharynx is clear and moist and mucous membranes are normal. No dental abscesses or uvula  swelling.  Eyes: Conjunctivae and EOM are normal. Pupils are equal, round, and reactive to light.  Neck: Normal range of motion and full passive range of motion without pain. Neck supple.  Cardiovascular: Normal rate, regular rhythm and normal heart sounds.  Exam reveals no gallop and no friction rub.   No murmur heard. Pulmonary/Chest: Effort normal and breath sounds normal. No respiratory distress. She has no wheezes. She has no rhonchi. She has no rales. She exhibits no tenderness and no crepitus.  Abdominal: Soft. Normal appearance and bowel sounds are normal. She exhibits no  distension. There is no tenderness. There is no rebound and no guarding.  Musculoskeletal: Normal range of motion. She exhibits no edema and no tenderness.  Moves all extremities well.   Neurological: She is alert and oriented to person, place, and time. She has normal strength. No cranial nerve deficit.  Patient has normal grips equally, she has no pronator drift, she has no weakness in her extremities.  Skin: Skin is warm, dry and intact. No rash noted. No erythema. No pallor.  Psychiatric: Her mood appears anxious. Her speech is rapid and/or pressured. She is agitated.    ED Course  Procedures (including critical care time) Medications  LORazepam (ATIVAN) injection 1 mg (1 mg Intravenous Given 09/23/13 1954)  potassium chloride SA (K-DUR,KLOR-CON) CR tablet 40 mEq (40 mEq Oral Given 09/23/13 2114)     During my exam patient states  "I'm going to go out, I'm getting ready to pass out". However on the monitor there was no change in her heart rhythm. Her blood pressure was stable. The patient seems extremely anxious.  At time of discharge patient HR in low 80's and her BP was 120/73. Pt is much calmer. Her muscle cramping is gone. We discussed stopping her HCTZ because it is causing an electrolyte imbalance and making her feel bad. She feels ready to go home.   Labs Review Results for orders placed during the hospital encounter of 09/23/13  ETHANOL      Result Value Ref Range   Alcohol, Ethyl (B) <11  0 - 11 mg/dL  PROTIME-INR      Result Value Ref Range   Prothrombin Time 12.5  11.6 - 15.2 seconds   INR 0.95  0.00 - 1.49  APTT      Result Value Ref Range   aPTT 29  24 - 37 seconds  CBC      Result Value Ref Range   WBC 9.0  4.0 - 10.5 K/uL   RBC 5.29 (*) 3.87 - 5.11 MIL/uL   Hemoglobin 16.0 (*) 12.0 - 15.0 g/dL   HCT 47.0 (*) 36.0 - 46.0 %   MCV 88.8  78.0 - 100.0 fL   MCH 30.2  26.0 - 34.0 pg   MCHC 34.0  30.0 - 36.0 g/dL   RDW 14.2  11.5 - 15.5 %   Platelets 343  150 - 400  K/uL  DIFFERENTIAL      Result Value Ref Range   Neutrophils Relative % 48  43 - 77 %   Neutro Abs 4.4  1.7 - 7.7 K/uL   Lymphocytes Relative 43  12 - 46 %   Lymphs Abs 3.8  0.7 - 4.0 K/uL   Monocytes Relative 6  3 - 12 %   Monocytes Absolute 0.5  0.1 - 1.0 K/uL   Eosinophils Relative 3  0 - 5 %   Eosinophils Absolute 0.3  0.0 - 0.7 K/uL   Basophils  Relative 0  0 - 1 %   Basophils Absolute 0.0  0.0 - 0.1 K/uL  COMPREHENSIVE METABOLIC PANEL      Result Value Ref Range   Sodium 135 (*) 137 - 147 mEq/L   Potassium 3.2 (*) 3.7 - 5.3 mEq/L   Chloride 92 (*) 96 - 112 mEq/L   CO2 26  19 - 32 mEq/L   Glucose, Bld 148 (*) 70 - 99 mg/dL   BUN 10  6 - 23 mg/dL   Creatinine, Ser 0.77  0.50 - 1.10 mg/dL   Calcium 9.7  8.4 - 10.5 mg/dL   Total Protein 8.8 (*) 6.0 - 8.3 g/dL   Albumin 4.1  3.5 - 5.2 g/dL   AST 20  0 - 37 U/L   ALT 12  0 - 35 U/L   Alkaline Phosphatase 102  39 - 117 U/L   Total Bilirubin 0.4  0.3 - 1.2 mg/dL   GFR calc non Af Amer >90  >90 mL/min   GFR calc Af Amer >90  >90 mL/min  TROPONIN I      Result Value Ref Range   Troponin I <0.30  <0.30 ng/mL   No results found.    Imaging Review Ct Head Wo Contrast  09/23/2013   CLINICAL DATA:  Right-sided weakness.  EXAM: CT HEAD WITHOUT CONTRAST  TECHNIQUE: Contiguous axial images were obtained from the base of the skull through the vertex without intravenous contrast.  COMPARISON:  None.  FINDINGS: The brain demonstrates no evidence of hemorrhage, infarction, edema, mass effect, extra-axial fluid collection, hydrocephalus or mass lesion. The skull is unremarkable.  IMPRESSION: Normal head CT.   Electronically Signed   By: Aletta Edouard M.D.   On: 09/23/2013 20:46     EKG Interpretation None       Date: 09/23/2013  Rate: 95  Rhythm: normal sinus rhythm  QRS Axis: normal  Intervals: QT prolonged  ST/T Wave abnormalities: nonspecific ST/T changes  Conduction Disutrbances:none  Narrative Interpretation:   Old EKG  Reviewed: none available    MDM   Final diagnoses:  Muscle cramping  Hypokalemia  Hyponatremia  Chloride, decreased level  Polycythemia    Plan discharge   Rolland Porter, MD, Abram Sander     Janice Norrie, MD 09/23/13 2151

## 2013-09-24 ENCOUNTER — Encounter (HOSPITAL_COMMUNITY): Payer: Self-pay | Admitting: Emergency Medicine

## 2013-09-24 ENCOUNTER — Emergency Department (HOSPITAL_COMMUNITY)
Admission: EM | Admit: 2013-09-24 | Discharge: 2013-09-24 | Disposition: A | Discharge status: Home / Self Care | Payer: BC Managed Care – PPO | Attending: Emergency Medicine | Admitting: Emergency Medicine

## 2013-09-24 ENCOUNTER — Telehealth: Payer: Self-pay | Admitting: Family Medicine

## 2013-09-24 DIAGNOSIS — I959 Hypotension, unspecified: Secondary | ICD-10-CM | POA: Insufficient documentation

## 2013-09-24 DIAGNOSIS — E876 Hypokalemia: Secondary | ICD-10-CM | POA: Insufficient documentation

## 2013-09-24 DIAGNOSIS — Z79899 Other long term (current) drug therapy: Secondary | ICD-10-CM | POA: Insufficient documentation

## 2013-09-24 DIAGNOSIS — I10 Essential (primary) hypertension: Secondary | ICD-10-CM | POA: Insufficient documentation

## 2013-09-24 DIAGNOSIS — F17200 Nicotine dependence, unspecified, uncomplicated: Secondary | ICD-10-CM | POA: Insufficient documentation

## 2013-09-24 DIAGNOSIS — F410 Panic disorder [episodic paroxysmal anxiety] without agoraphobia: Secondary | ICD-10-CM | POA: Insufficient documentation

## 2013-09-24 LAB — CBC WITH DIFFERENTIAL/PLATELET
Basophils Absolute: 0 10*3/uL (ref 0.0–0.1)
Basophils Relative: 0 % (ref 0–1)
EOS ABS: 0.1 10*3/uL (ref 0.0–0.7)
Eosinophils Relative: 1 % (ref 0–5)
HEMATOCRIT: 41.8 % (ref 36.0–46.0)
HEMOGLOBIN: 14 g/dL (ref 12.0–15.0)
Lymphocytes Relative: 22 % (ref 12–46)
Lymphs Abs: 1.4 10*3/uL (ref 0.7–4.0)
MCH: 29.9 pg (ref 26.0–34.0)
MCHC: 33.5 g/dL (ref 30.0–36.0)
MCV: 89.3 fL (ref 78.0–100.0)
MONO ABS: 0.4 10*3/uL (ref 0.1–1.0)
MONOS PCT: 6 % (ref 3–12)
NEUTROS ABS: 4.4 10*3/uL (ref 1.7–7.7)
Neutrophils Relative %: 70 % (ref 43–77)
Platelets: 240 10*3/uL (ref 150–400)
RBC: 4.68 MIL/uL (ref 3.87–5.11)
RDW: 14.3 % (ref 11.5–15.5)
WBC: 6.3 10*3/uL (ref 4.0–10.5)

## 2013-09-24 LAB — BASIC METABOLIC PANEL
BUN: 9 mg/dL (ref 6–23)
CO2: 26 mEq/L (ref 19–32)
Calcium: 9.6 mg/dL (ref 8.4–10.5)
Chloride: 95 mEq/L — ABNORMAL LOW (ref 96–112)
Creatinine, Ser: 0.72 mg/dL (ref 0.50–1.10)
GFR calc Af Amer: >90 mL/min (ref >90)
GFR calc non Af Amer: >90 mL/min (ref >90)
GLUCOSE: 110 mg/dL — AB (ref 70–99)
POTASSIUM: 4.3 meq/L (ref 3.7–5.3)
Sodium: 133 mEq/L — ABNORMAL LOW (ref 137–147)

## 2013-09-24 MED ORDER — LORAZEPAM 1 MG PO TABS: 1.0000 mg | ORAL_TABLET | Freq: Two times a day (BID) | ORAL | Status: DC | PRN

## 2013-09-24 MED ORDER — LORAZEPAM 2 MG/ML IJ SOLN
1.0000 mg | Freq: Once | INTRAMUSCULAR | Status: AC
  Administered 2013-09-24: 1 mg via INTRAVENOUS
  Filled 2013-09-24: qty 1

## 2013-09-24 MED ORDER — SODIUM CHLORIDE 0.9 % IV BOLUS (SEPSIS)
500.0000 mL | Freq: Once | INTRAVENOUS | Status: AC
  Administered 2013-09-24: 500 mL via INTRAVENOUS

## 2013-09-24 NOTE — Discharge Instructions (Signed)
Panic Attacks Panic attacks are sudden, short-livedsurges of severe anxiety, fear, or discomfort. They [place] occur for no reason when you are relaxed, when you are anxious, or when you are sleeping. Panic attacks [place] occur for a number of reasons:   Healthy people occasionally have panic attacks in extreme, life-threatening situations, such as war or natural disasters. Normal anxiety is a protective mechanism of the body that helps [place] react to danger (fight or flight response).  Panic attacks are often seen with anxiety disorders, such as panic disorder, social anxiety disorder, generalized anxiety disorder, and phobias. Anxiety disorders cause excessive or uncontrollable anxiety. They [place] interfere with your relationships or other life activities.  Panic attacks are sometimes seen with other mental illnesses such as depression and posttraumatic stress disorder.  Certain medical conditions, prescription medicines, and drugs of abuse can cause panic attacks. SYMPTOMS  Panic attacks start suddenly, peak within 20 minutes, and are accompanied by four or more of the following symptoms:  Pounding heart or fast heart rate (palpitations).  Sweating.  Trembling or shaking.  Shortness of breath or feeling smothered.  Feeling choked.  Chest pain or discomfort.  Nausea or strange feeling in your stomach.  Dizziness, lightheadedness, or feeling like you will faint.  Chills or hot flushes.  Numbness or tingling in your lips or hands and feet.  Feeling that things are not real or feeling that you are not yourself.  Fear of losing control or going crazy.  Fear of dying. Some of these symptoms can mimic serious medical conditions. For example, you [place] think you are having a heart attack. Although panic attacks can be very scary, they are not life threatening. DIAGNOSIS  Panic attacks are diagnosed through an assessment by your health care provider. Your health care provider will ask questions  about your symptoms, such as where and when they occurred. Your health care provider will also ask about your medical history and use of alcohol and drugs, including prescription medicines. Your health care provider [place] order blood tests or other studies to rule out a serious medical condition. Your health care provider Allard refer you to a mental health professional for further evaluation. TREATMENT   Most healthy people who have one or two panic attacks in an extreme, life-threatening situation will not require treatment.  The treatment for panic attacks associated with anxiety disorders or other mental illness typically involves counseling with a mental health professional, medicine, or a combination of both. Your health care provider will help determine what treatment is best for you.  Panic attacks due to physical illness usually goes away with treatment of the illness. If prescription medicine is causing panic attacks, talk with your health care provider about stopping the medicine, decreasing the dose, or substituting another medicine.  Panic attacks due to alcohol or drug abuse goes away with abstinence. Some adults need professional help in order to stop drinking or using drugs. HOME CARE INSTRUCTIONS   Take all your medicines as prescribed.   Check with your health care provider before starting new prescription or over-the-counter medicines.  Keep all follow up appointments with your health care provider. SEEK MEDICAL CARE IF:  You are not able to take your medicines as prescribed.  Your symptoms do not improve or get worse. SEEK IMMEDIATE MEDICAL CARE IF:   You experience panic attack symptoms that are different than your usual symptoms.  You have serious thoughts about hurting yourself or others.  You are taking medicine for panic attacks and  have a serious side effect. MAKE SURE YOU:  Understand these instructions.  Will watch your condition.  Will get help right away  if you are not doing well or get worse. Document Released: 05/27/2005 Document Revised: 03/17/2013 Document Reviewed: 01/08/2013 [facility] Patient Information 2014 [place].

## 2013-09-24 NOTE — ED Provider Notes (Signed)
CSN: [id number]     Arrival date & time 09/24/13  1332 History  This chart was scribed for Orpah Greek, MD by Elby Beck, ED Scribe. This patient was seen in room APA11/APA11 and the patient's care was started at 2:03 PM.   Chief Complaint  Patient presents with  . Numbness    The history is provided by the patient. No language interpreter was used.    HPI Comments: MAZEY MANTELL is a 58 y.o. female who presents to the Emergency Department complaining of intermittent generalized paresthesias over the past few days. She reports associated generalized cramping and shaking sensations over the past few days. She also reports an associated symptom of severe anxiety over the past few days, and she describes feeling like she is having panic attacks. Husband is in the room and he believes that anxiety is pt's primary symptom. Pt reports that she was seen here last night for the same symptoms. She states that her symptoms resolved but returned today. She states that she was told her Potassium was low when seen yesterday and she was also diagnosed with hypotension. She states that she has been taking Potassium pills. She states that she has not taken her BP medication today.   Past Medical History  Diagnosis Date  . Hypertension    History reviewed. No pertinent past surgical history. No family history on file. History  Substance Use Topics  . Smoking status: Current Some Day Smoker  . Smokeless tobacco: Never Used     Comment: uses E-cigs  . Alcohol Use: No   OB History   Grav Para Term Preterm Abortions TAB SAB Ect Mult Living                 Review of Systems  Neurological:       Generalized paresthesias and shaking  Psychiatric/Behavioral: The patient is nervous/anxious.   All other systems reviewed and are negative.   Allergies  Amlodipine; Lisinopril; and Chantix  Home Medications   Prior to Admission medications   Medication Sig Start Date End Date Taking?  Authorizing Provider  docusate sodium (STOOL SOFTENER) 100 MG capsule Take 100 mg by mouth daily as needed for mild constipation.    Historical Provider, MD  doxylamine, Sleep, (UNISOM) 25 MG tablet Take 25 mg by mouth at bedtime as needed for sleep.     Historical Provider, MD  hydrochlorothiazide (HYDRODIURIL) 25 MG tablet Take 1 tablet (25 mg total) by mouth daily. 09/08/13   Lonie Peak Dixon, PA-C  losartan (COZAAR) 50 MG tablet Take 50 mg by mouth daily.    Historical Provider, MD  potassium chloride SA (K-DUR,KLOR-CON) 20 MEQ tablet Take 1 tablet (20 mEq total) by mouth 2 (two) times daily. 09/23/13   Janice Norrie, MD   Triage Vitals: BP 149/82  Pulse 85  Temp(Src) 97.8 F (36.6 C) (Oral)  Resp 18  Ht 5\' 7"  (1.702 m)  Wt 160 lb (72.576 kg)  BMI 25.05 kg/m2  SpO2 100%  Physical Exam  Nursing note and vitals reviewed. Constitutional: She is oriented to person, place, and time. She appears well-developed and well-nourished. No distress.  HENT:  Head: Normocephalic and atraumatic.  Right Ear: Hearing normal.  Left Ear: Hearing normal.  Nose: Nose normal.  Mouth/Throat: Oropharynx is clear and moist and mucous membranes are normal.  Eyes: Conjunctivae and EOM are normal. Pupils are equal, round, and reactive to light.  Neck: Normal range of motion. Neck supple.  Cardiovascular: Regular  rhythm, S1 normal and S2 normal.  Exam reveals no gallop and no friction rub.   No murmur heard. Pulmonary/Chest: Effort normal and breath sounds normal. No respiratory distress. She exhibits no tenderness.  Abdominal: Soft. Normal appearance and bowel sounds are normal. There is no hepatosplenomegaly. There is no tenderness. There is no rebound, no guarding, no tenderness at McBurney's point and negative Murphy's sign. No hernia.  Musculoskeletal: Normal range of motion.  Neurological: She is alert and oriented to person, place, and time. She has normal strength. No cranial nerve deficit or sensory deficit.  Coordination normal. GCS eye subscore is 4. GCS verbal subscore is 5. GCS motor subscore is 6.  Cranial nerves intact  Skin: Skin is warm, dry and intact. No rash noted. No cyanosis.  Psychiatric: She has a normal mood and affect. Her speech is normal and behavior is normal.  Extremely anxious    ED Course  Procedures (including critical care time)  DIAGNOSTIC STUDIES: Oxygen Saturation is 100% on RA, normal by my interpretation.    COORDINATION OF CARE: 2:07 PM- Pt is requesting to be treated for anxiety. Discussed plan to order IV fluids and Ativan. Will also obtain diagnostic lab work. Pt advised of plan for treatment and pt agrees.  Medications  sodium chloride 0.9 % bolus 500 mL (0 mLs Intravenous Stopped 09/24/13 1457)  LORazepam (ATIVAN) injection 1 mg (1 mg Intravenous Given 09/24/13 1422)   Labs Review Labs Reviewed  BASIC METABOLIC PANEL - Abnormal; Notable for the following:    Sodium 133 (*)    Chloride 95 (*)    Glucose, Bld 110 (*)    All other components within normal limits  CBC WITH DIFFERENTIAL    Imaging Review Ct Head Wo Contrast  09/23/2013   CLINICAL DATA:  Right-sided weakness.  EXAM: CT HEAD WITHOUT CONTRAST  TECHNIQUE: Contiguous axial images were obtained from the base of the skull through the vertex without intravenous contrast.  COMPARISON:  None.  FINDINGS: The brain demonstrates no evidence of hemorrhage, infarction, edema, mass effect, extra-axial fluid collection, hydrocephalus or mass lesion. The skull is unremarkable.  IMPRESSION: Normal head CT.   Electronically Signed   By: Aletta Edouard M.D.   On: 09/23/2013 20:46     EKG Interpretation None      MDM   Final diagnoses:  Panic attack   Patient returns to the ER with vague complaints of numbness in the lower and shaking. She has a history of severe panic attacks. She was seen yesterday and diagnosed with low potassium. Her hydrochlorothiazide was stopped. Repeat potassium today is now  normal. She was given Ativan and her symptoms have resolved. Patient will be discharged on Ativan as needed until she can see her primary care doctor next week.   I personally performed the services described in this documentation, which was scribed in my presence. The recorded information has been reviewed and is accurate.   Orpah Greek, MD 09/24/13 [phone]

## 2013-09-24 NOTE — Telephone Encounter (Signed)
Agree 

## 2013-09-24 NOTE — ED Notes (Signed)
Patient given discharge instruction, verbalized understand. IV removed, band aid applied. Patient ambulatory out of the department.  

## 2013-09-24 NOTE — ED Notes (Signed)
Numbness and tingling sensations, seen here last night for same, diagnosed with hypotension

## 2013-09-24 NOTE — Telephone Encounter (Signed)
Pt rushed to ED last night.  Had very low Potassium level.  Concerned over BP meds.  ED told her to stop HCTZ and gave her potasium Rx for BID x 5 days.  Told husband and patient (had her on phone) to follow ED provider orders.  BP meds can cause low K+.  Husband stated she had been having cramps.  I told patient she should have called if felt she was having problem with new med.  She said she didn't think about it because she was having cramps prior to new med.  Reinforced to follow ED advise, stop HCTZ.  Be sure to take K+ BID over the weekend.  Made appt for Monday to be seen to recheck K+ and discuss treatment of BP.

## 2013-09-27 ENCOUNTER — Ambulatory Visit (INDEPENDENT_AMBULATORY_CARE_PROVIDER_SITE_OTHER): Payer: BC Managed Care – HMO | Admitting: Physician Assistant

## 2013-09-27 ENCOUNTER — Encounter: Payer: Self-pay | Admitting: Physician Assistant

## 2013-09-27 VITALS — BP 146/90 | HR 76 | Temp 98.3°F | Resp 18 | Wt 161.0 lb

## 2013-09-27 DIAGNOSIS — F17200 Nicotine dependence, unspecified, uncomplicated: Secondary | ICD-10-CM

## 2013-09-27 DIAGNOSIS — F410 Panic disorder [episodic paroxysmal anxiety] without agoraphobia: Secondary | ICD-10-CM | POA: Insufficient documentation

## 2013-09-27 DIAGNOSIS — F411 Generalized anxiety disorder: Secondary | ICD-10-CM

## 2013-09-27 DIAGNOSIS — I10 Essential (primary) hypertension: Secondary | ICD-10-CM

## 2013-09-27 MED ORDER — ESCITALOPRAM OXALATE 10 MG PO TABS: 10.0000 mg | ORAL_TABLET | Freq: Every day | ORAL | Status: DC

## 2013-09-27 MED ORDER — LORAZEPAM 1 MG PO TABS
1.0000 mg | ORAL_TABLET | Freq: Two times a day (BID) | ORAL | Status: DC | PRN
Start: 2013-09-27 — End: 2013-10-14

## 2013-09-28 LAB — BASIC METABOLIC PANEL WITH GFR
BUN: 9 mg/dL (ref 6–23)
CHLORIDE: 102 meq/L (ref 96–112)
CO2: 27 meq/L (ref 19–32)
CREATININE: 0.77 mg/dL (ref 0.50–1.10)
Calcium: 9.8 mg/dL (ref 8.4–10.5)
GFR, Est African American: >89 mL/min
GFR, Est Non African American: 87 mL/min
Glucose, Bld: 106 mg/dL — ABNORMAL HIGH (ref 70–99)
Potassium: 5 mEq/L (ref 3.5–5.3)
Sodium: 138 mEq/L (ref 135–145)
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
## 2013-09-29 ENCOUNTER — Emergency Department (HOSPITAL_COMMUNITY)
Admission: EM | Admit: 2013-09-29 | Discharge: 2013-09-29 | Disposition: A | Discharge status: Home / Self Care | Payer: BC Managed Care – PPO | Attending: Emergency Medicine | Admitting: Emergency Medicine

## 2013-09-29 ENCOUNTER — Ambulatory Visit: Payer: Self-pay | Admitting: Family Medicine

## 2013-09-29 ENCOUNTER — Encounter (HOSPITAL_COMMUNITY): Payer: Self-pay | Admitting: Emergency Medicine

## 2013-09-29 ENCOUNTER — Telehealth: Payer: Self-pay | Admitting: Family Medicine

## 2013-09-29 DIAGNOSIS — F410 Panic disorder [episodic paroxysmal anxiety] without agoraphobia: Secondary | ICD-10-CM | POA: Insufficient documentation

## 2013-09-29 DIAGNOSIS — I10 Essential (primary) hypertension: Secondary | ICD-10-CM | POA: Insufficient documentation

## 2013-09-29 DIAGNOSIS — R079 Chest pain, unspecified: Secondary | ICD-10-CM

## 2013-09-29 DIAGNOSIS — R112 Nausea with vomiting, unspecified: Secondary | ICD-10-CM | POA: Insufficient documentation

## 2013-09-29 DIAGNOSIS — F17200 Nicotine dependence, unspecified, uncomplicated: Secondary | ICD-10-CM | POA: Insufficient documentation

## 2013-09-29 DIAGNOSIS — Z79899 Other long term (current) drug therapy: Secondary | ICD-10-CM | POA: Insufficient documentation

## 2013-09-29 LAB — CBC WITH DIFFERENTIAL/PLATELET
BASOS ABS: 0 10*3/uL (ref 0.0–0.1)
BASOS PCT: 0 % (ref 0–1)
EOS ABS: 0.3 10*3/uL (ref 0.0–0.7)
EOS PCT: 4 % (ref 0–5)
HEMATOCRIT: 44.2 % (ref 36.0–46.0)
Hemoglobin: 14.9 g/dL (ref 12.0–15.0)
Lymphocytes Relative: 17 % (ref 12–46)
Lymphs Abs: 1.2 10*3/uL (ref 0.7–4.0)
MCH: 30.8 pg (ref 26.0–34.0)
MCHC: 33.7 g/dL (ref 30.0–36.0)
MCV: 91.5 fL (ref 78.0–100.0)
MONO ABS: 0.5 10*3/uL (ref 0.1–1.0)
Monocytes Relative: 6 % (ref 3–12)
Neutro Abs: 5.3 10*3/uL (ref 1.7–7.7)
Neutrophils Relative %: 73 % (ref 43–77)
Platelets: 246 10*3/uL (ref 150–400)
RBC: 4.83 MIL/uL (ref 3.87–5.11)
RDW: 14.2 % (ref 11.5–15.5)
WBC: 7.3 10*3/uL (ref 4.0–10.5)

## 2013-09-29 LAB — COMPREHENSIVE METABOLIC PANEL
ALBUMIN: 3.8 g/dL (ref 3.5–5.2)
ALT: 9 U/L (ref 0–35)
AST: 16 U/L (ref 0–37)
Alkaline Phosphatase: 99 U/L (ref 39–117)
BILIRUBIN TOTAL: 0.4 mg/dL (ref 0.3–1.2)
BUN: 10 mg/dL (ref 6–23)
CO2: 31 mEq/L (ref 19–32)
CREATININE: 0.75 mg/dL (ref 0.50–1.10)
Calcium: 9.8 mg/dL (ref 8.4–10.5)
Chloride: 100 mEq/L (ref 96–112)
GFR calc Af Amer: >90 mL/min (ref >90)
GFR calc non Af Amer: >90 mL/min (ref >90)
Glucose, Bld: 132 mg/dL — ABNORMAL HIGH (ref 70–99)
Potassium: 4.3 mEq/L (ref 3.7–5.3)
Sodium: 141 mEq/L (ref 137–147)
Total Protein: 8.3 g/dL (ref 6.0–8.3)

## 2013-09-29 LAB — TROPONIN I: Troponin I: <0.3 ng/mL (ref <0.30)

## 2013-09-29 LAB — LIPASE, BLOOD: LIPASE: 62 U/L — AB (ref 11–59)

## 2013-09-29 MED ORDER — LORAZEPAM 2 MG/ML IJ SOLN
1.0000 mg | Freq: Once | INTRAMUSCULAR | Status: AC
  Administered 2013-09-29: 1 mg via INTRAVENOUS
  Filled 2013-09-29: qty 1

## 2013-09-29 NOTE — Telephone Encounter (Signed)
noted 

## 2013-09-29 NOTE — Telephone Encounter (Signed)
Husband states has started having cramping in chest  Upper right mid chest??  Was just evaluated at ED and here in past 5 days.  Made appt to come back here this afternoon.

## 2013-09-29 NOTE — ED Notes (Signed)
Pt c/o right side chest pain that began approximately one hour ago. Pt also reports SOB and lightheadedness.

## 2013-09-29 NOTE — Discharge Instructions (Signed)
Chest Pain (Nonspecific) °It is often hard to give a specific diagnosis for the cause of chest pain. There is always a chance that your pain could be related to something serious, such as a heart attack or a blood clot in the lungs. You need to follow up with your caregiver for further evaluation. °CAUSES  °· Heartburn. °· Pneumonia or bronchitis. °· Anxiety or stress. °· Inflammation around your heart (pericarditis) or lung (pleuritis or pleurisy). °· A blood clot in the lung. °· A collapsed lung (pneumothorax). It can develop suddenly on its own (spontaneous pneumothorax) or from injury (trauma) to the chest. °· Shingles infection (herpes zoster virus). °The chest wall is composed of bones, muscles, and cartilage. Any of these can be the source of the pain. °· The bones can be bruised by injury. °· The muscles or cartilage can be strained by coughing or overwork. °· The cartilage can be affected by inflammation and become sore (costochondritis). °DIAGNOSIS  °Lab tests or other studies, such as X-rays, electrocardiography, stress testing, or cardiac imaging, may be needed to find the cause of your pain.  °TREATMENT  °· Treatment depends on what may be causing your chest pain. Treatment may include: °· Acid blockers for heartburn. °· Anti-inflammatory medicine. °· Pain medicine for inflammatory conditions. °· Antibiotics if an infection is present. °· You may be advised to change lifestyle habits. This includes stopping smoking and avoiding alcohol, caffeine, and chocolate. °· You may be advised to keep your head raised (elevated) when sleeping. This reduces the chance of acid going backward from your stomach into your esophagus. °· Most of the time, nonspecific chest pain will improve within 2 to 3 days with rest and mild pain medicine. °HOME CARE INSTRUCTIONS  °· If antibiotics were prescribed, take your antibiotics as directed. Finish them even if you start to feel better. °· For the next few days, avoid physical  activities that bring on chest pain. Continue physical activities as directed. °· Do not smoke. °· Avoid drinking alcohol. °· Only take over-the-counter or prescription medicine for pain, discomfort, or fever as directed by your caregiver. °· Follow your caregiver's suggestions for further testing if your chest pain does not go away. °· Keep any follow-up appointments you made. If you do not go to an appointment, you could develop lasting (chronic) problems with pain. If there is any problem keeping an appointment, you must call to reschedule. °SEEK MEDICAL CARE IF:  °· You think you are having problems from the medicine you are taking. Read your medicine instructions carefully. °· Your chest pain does not go away, even after treatment. °· You develop a rash with blisters on your chest. °SEEK IMMEDIATE MEDICAL CARE IF:  °· You have increased chest pain or pain that spreads to your arm, neck, jaw, back, or abdomen. °· You develop shortness of breath, an increasing cough, or you are coughing up blood. °· You have severe back or abdominal pain, feel nauseous, or vomit. °· You develop severe weakness, fainting, or chills. °· You have a fever. °THIS IS AN EMERGENCY. Do not wait to see if the pain will go away. Get medical help at once. Call your local emergency services (911 in [place] Do not drive yourself to the hospital. °MAKE SURE YOU:  °· Understand these instructions. °· Will watch your condition. °· Will get help right away if you are not doing well or get worse. °Document Released: 03/06/2005 Document Revised: 08/19/2011 Document Reviewed: 12/31/2007 °ExitCare® Patient Information ©2014 [employer]   [employer]. ° °Panic Attacks °Panic attacks are sudden, short-lived surges of severe anxiety, fear, or discomfort. They may occur for no reason when you are relaxed, when you are anxious, or when you are sleeping. Panic attacks may occur for a number of reasons:  °· Healthy people occasionally have panic attacks in extreme,  life-threatening situations, such as war or natural disasters. Normal anxiety is a protective mechanism of the body that helps us react to danger (fight or flight response). °· Panic attacks are often seen with anxiety disorders, such as panic disorder, social anxiety disorder, generalized anxiety disorder, and phobias. Anxiety disorders cause excessive or uncontrollable anxiety. They may interfere with your relationships or other life activities. °· Panic attacks are sometimes seen with other mental illnesses such as depression and posttraumatic stress disorder. °· Certain medical conditions, prescription medicines, and drugs of abuse can cause panic attacks. °SYMPTOMS  °Panic attacks start suddenly, peak within 20 minutes, and are accompanied by four or more of the following symptoms: °· Pounding heart or fast heart rate (palpitations). °· Sweating. °· Trembling or shaking. °· Shortness of breath or feeling smothered. °· Feeling choked. °· Chest pain or discomfort. °· Nausea or strange feeling in your stomach. °· Dizziness, lightheadedness, or feeling like you will faint. °· Chills or hot flushes. °· Numbness or tingling in your lips or hands and feet. °· Feeling that things are not real or feeling that you are not yourself. °· Fear of losing control or going crazy. °· Fear of dying. °Some of these symptoms can mimic serious medical conditions. For example, you may think you are having a heart attack. Although panic attacks can be very scary, they are not life threatening. °DIAGNOSIS  °Panic attacks are diagnosed through an assessment by your health care provider. Your health care provider will ask questions about your symptoms, such as where and when they occurred. Your health care provider will also ask about your medical history and use of alcohol and drugs, including prescription medicines. Your health care provider may order blood tests or other studies to rule out a serious medical condition. Your health  care provider may refer you to a mental health professional for further evaluation. °TREATMENT  °· Most healthy people who have one or two panic attacks in an extreme, life-threatening situation will not require treatment. °· The treatment for panic attacks associated with anxiety disorders or other mental illness typically involves counseling with a mental health professional, medicine, or a combination of both. Your health care provider will help determine what treatment is best for you. °· Panic attacks due to physical illness usually goes away with treatment of the illness. If prescription medicine is causing panic attacks, talk with your health care provider about stopping the medicine, decreasing the dose, or substituting another medicine. °· Panic attacks due to alcohol or drug abuse goes away with abstinence. Some adults need professional help in order to stop drinking or using drugs. °HOME CARE INSTRUCTIONS  °· Take all your medicines as prescribed.   °· Check with your health care provider before starting new prescription or over-the-counter medicines. °· Keep all follow up appointments with your health care provider. °SEEK MEDICAL CARE IF: °· You are not able to take your medicines as prescribed. °· Your symptoms do not improve or get worse. °SEEK IMMEDIATE MEDICAL CARE IF:  °· You experience panic attack symptoms that are different than your usual symptoms. °· You have serious thoughts about hurting yourself or others. °· You are taking medicine for   panic attacks and have a serious side effect. °MAKE SURE YOU: °· Understand these instructions. °· Will watch your condition. °· Will get help right away if you are not doing well or get worse. °Document Released: 05/27/2005 Document Revised: 03/17/2013 Document Reviewed: 01/08/2013 °ExitCare® Patient Information ©2014 [employer]. ° °

## 2013-09-29 NOTE — ED Provider Notes (Signed)
CSN: [id number]     Arrival date & time 09/29/13  1336 History   First MD Initiated Contact with Patient 09/29/13 1339     Chief Complaint  Patient presents with  . Chest Pain     (Consider location/radiation/quality/duration/timing/severity/associated sxs/prior Treatment) Patient is a 58 y.o. female presenting with chest pain. The history is provided by the patient.  Chest Pain Pain location:  R chest Associated symptoms: nausea   Associated symptoms: no abdominal pain, no back pain, no headache, no numbness, no shortness of breath, not vomiting and no weakness    patient presents with crampy pain in her right lower chest. It began prior to arrival she was sitting in a rocking chair. She states it feels as if there is pain right ribs. She has mild nausea or vomiting. No fevers. No cough. No shortness of breath. She ate a bologna and cheese sandwich for lunch today. No diarrhea. She is an occasional smoker. She has had 2 ER visits and a primary care visit in the last 5 days for anxiety. She has hypertension, but no known coronary artery disease.   Past Medical History  Diagnosis Date  . Hypertension    History reviewed. No pertinent past surgical history. History reviewed. No pertinent family history. History  Substance Use Topics  . Smoking status: Current Some Day Smoker  . Smokeless tobacco: Never Used     Comment: uses E-cigs  . Alcohol Use: No   OB History   Grav Para Term Preterm Abortions TAB SAB Ect Mult Living                 Review of Systems  Constitutional: Negative for activity change and appetite change.  Eyes: Negative for pain.  Respiratory: Negative for chest tightness and shortness of breath.   Cardiovascular: Positive for chest pain. Negative for leg swelling.  Gastrointestinal: Positive for nausea. Negative for vomiting, abdominal pain and diarrhea.  Genitourinary: Negative for flank pain.  Musculoskeletal: Negative for back pain and neck stiffness.   Skin: Negative for rash.  Neurological: Negative for weakness, numbness and headaches.  Psychiatric/Behavioral: Negative for behavioral problems. The patient is nervous/anxious.       Allergies  Amlodipine; Lisinopril; and Chantix  Home Medications   Prior to Admission medications   Medication Sig Start Date End Date Taking? Authorizing Provider  docusate sodium (STOOL SOFTENER) 100 MG capsule Take 100 mg by mouth daily as needed for mild constipation.    Historical Provider, MD  doxylamine, Sleep, (UNISOM) 25 MG tablet Take 25 mg by mouth at bedtime as needed for sleep.     Historical Provider, MD  escitalopram (LEXAPRO) 10 MG tablet Take 1 tablet (10 mg total) by mouth daily. 09/27/13   Lonie Peak Dixon, PA-C  LORazepam (ATIVAN) 1 MG tablet Take 1 tablet (1 mg total) by mouth 2 (two) times daily as needed for anxiety. 09/27/13   Lonie Peak Dixon, PA-C  losartan (COZAAR) 50 MG tablet Take 50 mg by mouth daily.    Historical Provider, MD  potassium chloride SA (K-DUR,KLOR-CON) 20 MEQ tablet Take 1 tablet (20 mEq total) by mouth 2 (two) times daily. 09/23/13   Janice Norrie, MD   BP 124/77  Pulse 77  Temp(Src) 98.1 F (36.7 C) (Oral)  Resp 16  Ht 5\' 7"  (1.702 m)  Wt 160 lb (72.576 kg)  BMI 25.05 kg/m2  SpO2 92% Physical Exam  Nursing note and vitals reviewed. Constitutional: She is oriented to person, place, and  time. She appears well-developed and well-nourished.  HENT:  Head: Normocephalic and atraumatic.  Eyes: EOM are normal. Pupils are equal, round, and reactive to light.  Neck: Normal range of motion. Neck supple.  Cardiovascular: Normal rate, regular rhythm and normal heart sounds.   No murmur heard. Pulmonary/Chest: Effort normal and breath sounds normal. No respiratory distress. She has no wheezes. She has no rales.  Abdominal: Soft. Bowel sounds are normal. She exhibits no distension. There is tenderness. There is no rebound and no guarding.  Mild right upper quadrant  tenderness without rebound or guarding.  Musculoskeletal: Normal range of motion.  Neurological: She is alert and oriented to person, place, and time. No cranial nerve deficit.  Skin: Skin is warm and dry.  Psychiatric: Her speech is normal.  Patient appears very anxious    ED Course  Procedures (including critical care time) Labs Review Labs Reviewed  COMPREHENSIVE METABOLIC PANEL - Abnormal; Notable for the following:    Glucose, Bld 132 (*)    All other components within normal limits  LIPASE, BLOOD - Abnormal; Notable for the following:    Lipase 62 (*)    All other components within normal limits  CBC WITH DIFFERENTIAL  TROPONIN I    Imaging Review No results found.   EKG Interpretation None      Date: 09/29/2013  Rate: 97  Rhythm: normal sinus rhythm  QRS Axis: normal  Intervals: normal  ST/T Wave abnormalities: normal  Conduction Disutrbances: none  Narrative Interpretation: unremarkable    MDM   Final diagnoses:  Chest pain  Panic disorder    Patient with chest pain. History same. Has had recent visits for anxiety. Has seen her primary care doctor also has been started on anxiolytics. EKG reassuring. Lab work reassuring except for a lipase of 162 and a glucose of 132. Will follow with her primary care Dr.    Jasper Riling. Alvino Chapel, MD 09/29/13 [phone]

## 2013-09-29 NOTE — ED Notes (Signed)
Patient given discharge instruction, verbalized understand. IV removed, band aid applied. Patient ambulatory out of the department.  

## 2013-10-02 ENCOUNTER — Other Ambulatory Visit: Payer: Self-pay | Admitting: Physician Assistant

## 2013-10-04 NOTE — Telephone Encounter (Signed)
Refill denied for Amlodipine due to allergic reaction

## 2013-10-14 ENCOUNTER — Telehealth: Payer: Self-pay | Admitting: Physician Assistant

## 2013-10-14 DIAGNOSIS — F411 Generalized anxiety disorder: Secondary | ICD-10-CM

## 2013-10-14 DIAGNOSIS — F410 Panic disorder [episodic paroxysmal anxiety] without agoraphobia: Secondary | ICD-10-CM

## 2013-10-14 MED ORDER — LORAZEPAM 1 MG PO TABS: 1.0000 mg | ORAL_TABLET | Freq: Two times a day (BID) | ORAL | Status: DC | PRN

## 2013-10-14 NOTE — Telephone Encounter (Signed)
Rx called in and pt aware

## 2013-10-14 NOTE — Telephone Encounter (Signed)
Patient needs refill on lorazepam  Please call her back at [phone]  [phone]

## 2013-10-14 NOTE — Telephone Encounter (Signed)
Last RF 09/27/13 #30. Last OV 09/27/13.  OK refill?

## 2013-10-14 NOTE — Telephone Encounter (Signed)
Approved. Can increase quantity  To # 60 + 0.

## 2013-10-20 ENCOUNTER — Ambulatory Visit: Payer: BC Managed Care – HMO | Admitting: Physician Assistant

## 2013-10-21 ENCOUNTER — Encounter: Payer: Self-pay | Admitting: Physician Assistant

## 2013-10-21 ENCOUNTER — Ambulatory Visit (INDEPENDENT_AMBULATORY_CARE_PROVIDER_SITE_OTHER): Payer: BC Managed Care – HMO | Admitting: Physician Assistant

## 2013-10-21 VITALS — BP 112/74 | HR 80 | Temp 98.1°F | Resp 18 | Wt 158.0 lb

## 2013-10-21 DIAGNOSIS — K6289 Other specified diseases of anus and rectum: Secondary | ICD-10-CM

## 2013-10-21 MED ORDER — TRAMADOL HCL 50 MG PO TABS: ORAL_TABLET | ORAL | Status: DC

## 2013-10-21 NOTE — Progress Notes (Signed)
Patient ID: Danielle Morris MRN: [id number], DOB: 02/14/1956, 58 y.o. Date of Encounter: 10/21/2013, 12:19 PM    Chief Complaint:  Chief Complaint  Patient presents with  . c/o very painful hemorrhoid    off/on for several moths now very severe, has BM in pain for hours,  says has to push it back in.     HPI: 58 y.o. year old female reports that she has had an external hemorrhoid for a very long time. However she says is then causing a lot more pain and problems for the past 2 months. Says that it has been "really bad for the past one month". She recently moved into a new house and she thinks that "moving all of those boxes  and all of that lifting" has probably affected the flaring. Also has been lifting her granddaughter. Says that now it has gotten to the point that every time she has a bowel movement she has severe rectal pain for about 4 or 5 hours afterwards. She says she " cleans herself then pushes it back in."   Also asked if her stool was hard and if there is a lot of straining with her bowel movements. She says that she is taking 2 docusate every night but "they aren't working anymore." Says that her stool is hard and firm.     Home Meds: See attached medication section for any medications that were entered at today's visit. The computer does not put those onto this list.The following list is a list of meds entered prior to today's visit.   Current Outpatient Prescriptions on File Prior to Visit  Medication Sig Dispense Refill  . docusate sodium (STOOL SOFTENER) 100 MG capsule Take 100 mg by mouth daily as needed for mild constipation.      Marland Kitchen doxylamine, Sleep, (UNISOM) 25 MG tablet Take 25 mg by mouth at bedtime as needed for sleep.       Marland Kitchen LORazepam (ATIVAN) 1 MG tablet Take 1 tablet (1 mg total) by mouth 2 (two) times daily as needed for anxiety.  60 tablet  0  . losartan (COZAAR) 50 MG tablet Take 50 mg by mouth daily.      Marland Kitchen escitalopram (LEXAPRO) 10 MG tablet Take 1  tablet (10 mg total) by mouth daily.  30 tablet  1   No current facility-administered medications on file prior to visit.    Allergies:  Allergies  Allergen Reactions  . Amlodipine Shortness Of Breath, Anxiety and Palpitations  . Lisinopril Other (See Comments)    unknown  . Chantix [Varenicline] Nausea Only and Anxiety      Review of Systems: See HPI for pertinent ROS. All other ROS negative.    Physical Exam: Blood pressure 112/74, pulse 80, temperature 98.1 F (36.7 C), temperature source Oral, resp. rate 18, weight 158 lb (71.668 kg)., Body mass index is 24.74 kg/(m^2). General: WF.  Appears in no acute distress. Neck: Supple. No thyromegaly. No lymphadenopathy. Lungs: Clear bilaterally to auscultation without wheezes, rales, or rhonchi. Breathing is unlabored. Heart: Regular rhythm. No murmurs, rubs, or gallops. Msk:  Strength and tone normal for age. Extremities/Skin: Warm and dry. Anoscopy: She saw me get out the anoscope and she said there was no way I was sticking that in her. She said there was no way she could even let me touch her in that area because it is so sore. She was agreeable to lie on exam table on her side. Inspection shows some external  hemorrhoids (not thrombosed--they are normal skin color--with no edema and no purple coloration). I was able to palpate very gently around the outside of anal opening. Just left of the anus, I can feel an area of firmness beneath the skin. That area is tender. There was no other area of firmness and no other area of tenderness.  She refuses DRE or Anoscopy. Neuro: Alert and oriented X 3. Moves all extremities spontaneously. Gait is normal. CNII-XII grossly in tact. Psych:  Responds to questions appropriately with a normal affect.     ASSESSMENT AND PLAN:  58 y.o. year old female with  1. Rectal or anal pain I explained to the patient that I really need to do further exam to further determine the cause of her pain. Explained  that I felt an area of firmness under the left side of her anus area which is also the area that is painful. Tried to explain that this could be something besides a hemorrhoid. It could be some type of mass or rectal abscess et Ronney Asters. However she absolutely refused any further exam here. Then discussed having her followup with GI so they could further examine/evaluate. Discussed trying to get her in to see someone today. She says that she cannot go today. I then discussed trying to get her in to see someone tomorrow. She says that she will not be able to go tomorrow and the earliest she could go is on Monday. She says that she " has lived with this for 2 or 3 months so she can live with it for another few days." Does request med to use for pain in interim. (NOTE: She is not taking the Lexapro which is listed on her medicine list. Therefore, no concerns regarding serotonin syndrome with this.)  - traMADol (ULTRAM) 50 MG tablet; Take 1-2 every 6 hours as needed.  Dispense: 60 tablet; Refill: 0 - Ambulatory referral to Gastroenterology   Signed, Olean Ree Ebony, [place], [facility] 10/21/2013 12:19 PM

## 2013-10-22 ENCOUNTER — Telehealth: Payer: Self-pay

## 2013-10-22 NOTE — Telephone Encounter (Signed)
[facility] called this morning for a referral on her for rectal pain/ I made her an appointment on May 28 @ 1000 with AS. I advise her and her husband to go to the ER if it get worst. They understood.

## 2013-10-22 NOTE — Telephone Encounter (Signed)
Reviewed records. Offer her urgent OV next week.

## 2013-10-22 NOTE — Telephone Encounter (Signed)
i have moved her to May 20 at 1100 with [facility]

## 2013-10-23 ENCOUNTER — Encounter (HOSPITAL_COMMUNITY): Payer: Self-pay | Admitting: Emergency Medicine

## 2013-10-23 ENCOUNTER — Emergency Department (HOSPITAL_COMMUNITY)
Admission: EM | Admit: 2013-10-23 | Discharge: 2013-10-23 | Disposition: A | Discharge status: Home / Self Care | Payer: BC Managed Care – PPO | Attending: Emergency Medicine | Admitting: Emergency Medicine

## 2013-10-23 DIAGNOSIS — F411 Generalized anxiety disorder: Secondary | ICD-10-CM | POA: Insufficient documentation

## 2013-10-23 DIAGNOSIS — R259 Unspecified abnormal involuntary movements: Secondary | ICD-10-CM | POA: Insufficient documentation

## 2013-10-23 DIAGNOSIS — T50905A Adverse effect of unspecified drugs, medicaments and biological substances, initial encounter: Secondary | ICD-10-CM

## 2013-10-23 DIAGNOSIS — I10 Essential (primary) hypertension: Secondary | ICD-10-CM | POA: Insufficient documentation

## 2013-10-23 DIAGNOSIS — T4275XA Adverse effect of unspecified antiepileptic and sedative-hypnotic drugs, initial encounter: Secondary | ICD-10-CM | POA: Insufficient documentation

## 2013-10-23 DIAGNOSIS — F17200 Nicotine dependence, unspecified, uncomplicated: Secondary | ICD-10-CM | POA: Insufficient documentation

## 2013-10-23 HISTORY — DX: Panic disorder (episodic paroxysmal anxiety): F41.0

## 2013-10-23 LAB — CBC WITH DIFFERENTIAL/PLATELET
Basophils Absolute: 0 10*3/uL (ref 0.0–0.1)
Basophils Relative: 0 % (ref 0–1)
Eosinophils Absolute: 0.2 10*3/uL (ref 0.0–0.7)
Eosinophils Relative: 3 % (ref 0–5)
HCT: 41.6 % (ref 36.0–46.0)
Hemoglobin: 14.1 g/dL (ref 12.0–15.0)
LYMPHS PCT: 22 % (ref 12–46)
Lymphs Abs: 1.4 10*3/uL (ref 0.7–4.0)
MCH: 30.1 pg (ref 26.0–34.0)
MCHC: 33.9 g/dL (ref 30.0–36.0)
MCV: 88.9 fL (ref 78.0–100.0)
MONO ABS: 0.4 10*3/uL (ref 0.1–1.0)
Monocytes Relative: 7 % (ref 3–12)
NEUTROS ABS: 4.3 10*3/uL (ref 1.7–7.7)
Neutrophils Relative %: 68 % (ref 43–77)
PLATELETS: 283 10*3/uL (ref 150–400)
RBC: 4.68 MIL/uL (ref 3.87–5.11)
RDW: 13.7 % (ref 11.5–15.5)
WBC: 6.3 10*3/uL (ref 4.0–10.5)

## 2013-10-23 LAB — COMPREHENSIVE METABOLIC PANEL
ALK PHOS: 88 U/L (ref 39–117)
ALT: 12 U/L (ref 0–35)
AST: 18 U/L (ref 0–37)
Albumin: 3.9 g/dL (ref 3.5–5.2)
BUN: 5 mg/dL — ABNORMAL LOW (ref 6–23)
CALCIUM: 9.6 mg/dL (ref 8.4–10.5)
CO2: 25 mEq/L (ref 19–32)
Chloride: 101 mEq/L (ref 96–112)
Creatinine, Ser: 0.71 mg/dL (ref 0.50–1.10)
GFR calc Af Amer: >90 mL/min (ref >90)
GFR calc non Af Amer: >90 mL/min (ref >90)
Glucose, Bld: 130 mg/dL — ABNORMAL HIGH (ref 70–99)
POTASSIUM: 3.7 meq/L (ref 3.7–5.3)
SODIUM: 140 meq/L (ref 137–147)
TOTAL PROTEIN: 8.1 g/dL (ref 6.0–8.3)
Total Bilirubin: 0.4 mg/dL (ref 0.3–1.2)

## 2013-10-23 LAB — URINE MICROSCOPIC-ADD ON

## 2013-10-23 LAB — URINALYSIS, ROUTINE W REFLEX MICROSCOPIC
BILIRUBIN URINE: NEGATIVE
GLUCOSE, UA: NEGATIVE mg/dL
KETONES UR: NEGATIVE mg/dL
Leukocytes, UA: NEGATIVE
Nitrite: NEGATIVE
PH: 6 (ref 5.0–8.0)
Protein, ur: NEGATIVE mg/dL
Urobilinogen, UA: 0.2 mg/dL (ref 0.0–1.0)

## 2013-10-23 LAB — TROPONIN I: Troponin I: <0.3 ng/mL (ref <0.30)

## 2013-10-23 MED ORDER — HYDROCODONE-ACETAMINOPHEN 5-325 MG PO TABS: 2.0000 | ORAL_TABLET | ORAL | Status: DC | PRN

## 2013-10-23 NOTE — ED Notes (Signed)
Patient reports feeling very shaky/jittery since taking tramadol today. Per patient first time taking medication. Patient reports seeing PCP on Thursday in which she was to follow-up with GI specialist for possible hemorrhoids. Per patient tramadol was given for pain. Patient reports having panic attacks. Per family patient seen here in past for allergic reaction to medication.

## 2013-10-23 NOTE — ED Provider Notes (Signed)
CSN: [id number]     Arrival date & time 10/23/13  1802 History  This chart was scribed for Danielle Essex, MD by Roxan Diesel, ED scribe.  This patient was seen in room [facility]6/[facility]6 and the patient's care was started at 8:18 PM.   Chief Complaint  Patient presents with  . Medication Reaction  . Shaking    The history is provided by the patient. No language interpreter was used.    HPI Comments: Danielle Morris is a 58 y.o. female with h/o HTN, panic attack, and rectal pain who presents to the Emergency Department complaining of a possible reaction to Tramadol.  Pt states that she was prescribed Tramadol by her PCP recently for rectal pain on bowel movements that has been ongoing for the past 2 months.  She took it as yesterday with no adverse effect.  However today she took 3 pills: one at 12 PM, another at 2 PM, and her last one at 4 PM.  She was not trying to hurt herself but was trying to relieve her pain.  Shortly after 4 PM she started to feel "shaky" throughout her body.  This lasted 10-15 minutes and has since improved but currently she feels "queasy."  She denies CP, SOB, dizziness or lightheadedness.  She denies rectal pain currently and states she only has it when she moves her bowels.  She has an appointment with a GI within the next week.   Past Medical History  Diagnosis Date  . Hypertension   . Panic attack     History reviewed. No pertinent past surgical history.   Family History  Problem Relation Age of Onset  . Heart disease Father   . Diabetes Other     History  Substance Use Topics  . Smoking status: Current Some Day Smoker -- 0.50 packs/day for 10 years    Types: Cigarettes  . Smokeless tobacco: Never Used     Comment: uses E-cigs  . Alcohol Use: No    OB History   Grav Para Term Preterm Abortions TAB SAB Ect Mult Living   3 3 3       3        Review of Systems A complete 10 system review of systems was obtained and all systems are negative except  as noted in the HPI and PMH.     Allergies  Amlodipine; Lisinopril; and Chantix  Home Medications   Prior to Admission medications   Medication Sig Start Date End Date Taking? Authorizing Provider  docusate sodium (STOOL SOFTENER) 100 MG capsule Take 100 mg by mouth daily as needed for mild constipation.    Historical Provider, MD  doxylamine, Sleep, (UNISOM) 25 MG tablet Take 25 mg by mouth at bedtime as needed for sleep.     Historical Provider, MD  escitalopram (LEXAPRO) 10 MG tablet Take 1 tablet (10 mg total) by mouth daily. 09/27/13   Lonie Peak Dixon, PA-C  LORazepam (ATIVAN) 1 MG tablet Take 1 tablet (1 mg total) by mouth 2 (two) times daily as needed for anxiety. 10/14/13   Lonie Peak Dixon, PA-C  losartan (COZAAR) 50 MG tablet Take 50 mg by mouth daily.    Historical Provider, MD  traMADol (ULTRAM) 50 MG tablet Take 1-2 every 6 hours as needed. 10/21/13   Lonie Peak Dixon, PA-C   BP 150/99  Pulse 89  Temp(Src) 98.4 F (36.9 C) (Oral)  Resp 24  Ht 5\' 7"  (1.702 m)  Wt 156 lb (70.761 kg)  BMI 24.43 kg/m2  SpO2 100%  Physical Exam  Nursing note and vitals reviewed. Constitutional: She is oriented to person, place, and time. She appears well-developed and well-nourished. No distress.  HENT:  Head: Normocephalic and atraumatic.  Mouth/Throat: Oropharynx is clear and moist. No oropharyngeal exudate.  Eyes: Conjunctivae and EOM are normal. Pupils are equal, round, and reactive to light.  Neck: Normal range of motion. Neck supple. No tracheal deviation present.  Cardiovascular: Normal rate, regular rhythm and normal heart sounds.   No murmur heard. Pulmonary/Chest: Effort normal and breath sounds normal. No respiratory distress. She has no wheezes. She has no rales.  Abdominal: Soft. There is no tenderness.  Musculoskeletal: Normal range of motion. She exhibits no edema and no tenderness.  Neurological: She is alert and oriented to person, place, and time. No cranial nerve deficit. She  exhibits normal muscle tone. Coordination normal.  CN 2-12 intact, no ataxia on finger to nose, no nystagmus, 5/5 strength throughout, no pronator drift. No clonus  Skin: Skin is warm and dry.  Psychiatric: Her behavior is normal. Her mood appears anxious.    ED Course  Procedures (including critical care time)  DIAGNOSTIC STUDIES: Oxygen Saturation is 100% on room air, normal by my interpretation.    COORDINATION OF CARE: 8:24 PM-Pt refuses any rectal examination.  Discussed treatment plan which includes labs with pt at bedside and pt agreed to plan.     Labs Review Labs Reviewed  COMPREHENSIVE METABOLIC PANEL - Abnormal; Notable for the following:    Glucose, Bld 130 (*)    BUN 5 (*)    All other components within normal limits  URINALYSIS, ROUTINE W REFLEX MICROSCOPIC - Abnormal; Notable for the following:    Specific Gravity, Urine <1.005 (*)    Hgb urine dipstick TRACE (*)    All other components within normal limits  CBC WITH DIFFERENTIAL  TROPONIN I  URINE MICROSCOPIC-ADD ON    Imaging Review No results found.   EKG Interpretation   Date/Time:  Saturday Oct 23 2013 20:56:23 EDT Ventricular Rate:  77 PR Interval:  158 QRS Duration: 95 QT Interval:  410 QTC Calculation: 464 R Axis:   63 Text Interpretation:  Sinus rhythm Baseline wander in lead(s) II III aVF  V2 Artifact No significant change was found Confirmed by Wyvonnia Dusky  MD,  Terez Freimark [phone]) on 10/23/2013 8:59:22 PM      MDM   Final diagnoses:  Medication reaction   Patient reports feeling shaky and jittery since taking 50 mg Ultram every 2 hours x3. She denies any suicidal thoughts. She was trying to treat rectal pain which has been chronic. She has GI followup this week. She declined any rectal exam today.  She feels better after waiting in the waiting room. No chest pain or shortness of breath. No dizziness, lightheadedness, focal weakness, numbness or tingling.  Labs are unremarkable. Patient  advised to discontinue her tramadol use. No evidence of serotonin syndrome. She has followup this Wednesday with GI. She declines an exam of rectal area today.    I personally performed the services described in this documentation, which was scribed in my presence. The recorded information has been reviewed and is accurate.   Danielle Essex, MD 10/24/13 [phone]

## 2013-10-23 NOTE — Discharge Instructions (Signed)
Drug Allergy Stop taking tramadol. Followup as scheduled with her doctor. Return to the ED if you develop new or worsening symptoms. Allergic reactions to medicines are common. Some allergic reactions are mild. A delayed type of drug allergy that occurs 1 week or more after exposure to a medicine or vaccine is called serum sickness. A life-threatening, sudden (acute) allergic reaction that involves the whole body is called anaphylaxis. CAUSES  "True" drug allergies occur when there is an allergic reaction to a medicine. This is caused by overactivity of the immune system. First, the body becomes sensitized. The immune system is triggered by your first exposure to the medicine. Following this first exposure, future exposure to the same medicine may be life-threatening. Almost any medicine can cause an allergic reaction. Common ones are:  Penicillin.  Sulfonamides (sulfa drugs).  Local anesthetics.  X-ray dyes that contain iodine. SYMPTOMS  Common symptoms of a minor allergic reaction are:  Swelling around the mouth.  An itchy red rash or hives.  Vomiting or diarrhea. Anaphylaxis can cause swelling of the mouth and throat. This makes it difficult to breathe and swallow. Severe reactions can be fatal within seconds, even after exposure to only a trace amount of the drug that causes the reaction. HOME CARE INSTRUCTIONS   If you are unsure of what caused your reaction, keep a diary of foods and medicines used. Include the symptoms that followed. Avoid anything that causes reactions.  You may want to follow up with an allergy specialist after the reaction has cleared in order to be tested to confirm the allergy. It is important to confirm that your reaction is an allergy, not just a side effect to the medicine. If you have a true allergy to a medicine, this may prevent that medicine and related medicines from being given to you when you are very ill.  If you have hives or a rash:  Take  medicines as directed by your caregiver.  You may use an over-the-counter antihistamine (diphenhydramine) as needed.  Apply cold compresses to the skin or take baths in cool water. Avoid hot baths or showers.  If you are severely allergic:  Continuous observation after a severe reaction may be needed. Hospitalization is often required.  Wear a medical alert bracelet or necklace stating your allergy.  You and your family must learn how to use an anaphylaxis kit or give an epinephrine injection to temporarily treat an emergency allergic reaction. If you have had a severe reaction, always carry your epinephrine injection or anaphylaxis kit with you. This can be lifesaving if you have a severe reaction.  Do not drive or perform tasks after treatment until the medicines used to treat your reaction have worn off, or until your caregiver says it is okay. SEEK MEDICAL CARE IF:   You think you had an allergic reaction. Symptoms usually start within 30 minutes after exposure.  Symptoms are getting worse rather than better.  You develop new symptoms.  The symptoms that brought you to your caregiver return. SEEK IMMEDIATE MEDICAL CARE IF:   You have swelling of the mouth, difficulty breathing, or wheezing.  You have a tight feeling in your chest or throat.  You develop hives, swelling, or itching all over your body.  You develop severe vomiting or diarrhea.  You feel faint or pass out. This is an emergency. Use your epinephrine injection or anaphylaxis kit as you have been instructed. Call for emergency medical help. Even if you improve after the injection, you  need to be examined at a hospital emergency department. MAKE SURE YOU:   Understand these instructions.  Will watch your condition.  Will get help right away if you are not doing well or get worse. Document Released: 05/27/2005 Document Revised: 08/19/2011 Document Reviewed: 10/31/2010 [facility] Patient Information 2014  [place], [place].

## 2013-10-27 ENCOUNTER — Other Ambulatory Visit: Payer: Self-pay | Admitting: Gastroenterology

## 2013-10-27 ENCOUNTER — Ambulatory Visit (INDEPENDENT_AMBULATORY_CARE_PROVIDER_SITE_OTHER): Payer: BC Managed Care – PPO | Admitting: Gastroenterology

## 2013-10-27 ENCOUNTER — Encounter (INDEPENDENT_AMBULATORY_CARE_PROVIDER_SITE_OTHER): Payer: Self-pay

## 2013-10-27 ENCOUNTER — Encounter (HOSPITAL_COMMUNITY): Payer: Self-pay | Admitting: Pharmacy Technician

## 2013-10-27 ENCOUNTER — Encounter: Payer: Self-pay | Admitting: Gastroenterology

## 2013-10-27 VITALS — BP 129/85 | HR 79 | Temp 97.3°F | Ht 67.0 in | Wt 155.2 lb

## 2013-10-27 DIAGNOSIS — K625 Hemorrhage of anus and rectum: Secondary | ICD-10-CM | POA: Insufficient documentation

## 2013-10-27 DIAGNOSIS — F410 Panic disorder [episodic paroxysmal anxiety] without agoraphobia: Secondary | ICD-10-CM

## 2013-10-27 DIAGNOSIS — K6289 Other specified diseases of anus and rectum: Secondary | ICD-10-CM | POA: Insufficient documentation

## 2013-10-27 MED ORDER — PEG-KCL-NACL-NASULF-NA ASC-C 100 G PO SOLR: 1.0000 | ORAL | Status: DC

## 2013-10-27 MED ORDER — LIDOCAINE-HYDROCORTISONE ACE 3-2.5 % RE KIT: 1.0000 | PACK | Freq: Two times a day (BID) | RECTAL | Status: DC

## 2013-10-27 MED ORDER — LINACLOTIDE 145 MCG PO CAPS: 145.0000 ug | ORAL_CAPSULE | Freq: Every day | ORAL | Status: DC

## 2013-10-27 NOTE — Progress Notes (Signed)
Primary Care Physician:  Karis Juba, PA-C  Primary Gastroenterologist:  Barney Drain, MD   Chief Complaint  Patient presents with  . Rectal Pain    HPI:  Danielle Morris is a 58 y.o. female here for an urgent office visit at the request of Karis Juba PA-C. She has problems with severe rectal pain which is been worse with the past couple of months. Symptoms have been present for over 2 years. Pain is usually brought on bowel bowel movement. When she has a bowel movement she has severe pain for 4-5 hours which debilitates her. She has to go to bed until pain stops. About 2-3 months ago she moved, did a lot of heavy lifting and wonders if this aggravated hemorrhoids. She notes that she has something protrude after each BM. This is associated with pain. She gets in the bathtub and wishes it back in. Denies any issues with constipation. Her stools are always [place] 4 to 5. Stopped eating to try to slow BMs. Has a lot of gas. Occasional brbpr. No nocturnal rectal pain. Takes two stools softners at bedtime. Refuses rectal exam due to the pain. Cannot ambulate due to the pain. Not getting out of bed. No urination problems. No fevers. Tried OTC cream that helped for awhile. For the past couple of weeks she's had some left lower quadrant discomfort which she describes as a pinching-type pain. She eats into her left hip. Sometimes noted after a bowel movement. Unrelated to meals. No she lost about 15 pounds in the past few months because she's afraid to eat. States she's gained some of this weight back however. Denies vomiting, heartburn, dysphagia. Currently having a bowel movement about every other day. Denies any fever or chills.  Complains of severe anxiety the past few months which began with her mood. Also exacerbated by a reaction to a blood pressure medication. She was very tearful in the office. Refuses any type of anorectal manipulation or examination. Abdomen about being deeply sedated for  colonoscopy.     Current Outpatient Prescriptions  Medication Sig Dispense Refill  . docusate sodium (STOOL SOFTENER) 100 MG capsule Take 100 mg by mouth 2 (two) times daily.       Marland Kitchen doxylamine, Sleep, (UNISOM) 25 MG tablet Take 25 mg by mouth at bedtime as needed for sleep.       Marland Kitchen HYDROcodone-acetaminophen (NORCO/VICODIN) 5-325 MG per tablet Take 2 tablets by mouth every 4 (four) hours as needed.  10 tablet  0  . LORazepam (ATIVAN) 1 MG tablet Take 1 tablet (1 mg total) by mouth 2 (two) times daily as needed for anxiety.  60 tablet  0  . losartan (COZAAR) 50 MG tablet Take 50 mg by mouth daily.      .       .       .         No current facility-administered medications for this visit.    Allergies as of 10/27/2013 - Review Complete 10/27/2013  Allergen Reaction Noted  . Amlodipine Shortness Of Breath, Anxiety, and Palpitations 08/11/2013  . Lisinopril Other (See Comments) 09/24/2013  . Chantix [varenicline] Nausea Only and Anxiety 06/22/2013    Past Medical History  Diagnosis Date  . Hypertension   . Panic attack     Past Surgical History  Procedure Laterality Date  . Tonsillectomy      age  68    Family History  Problem Relation Age of Onset  . Heart disease  Father   . Diabetes Other     History   Social History  . Marital Status: Married    Spouse Name: N/A    Number of Children: N/A  . Years of Education: N/A   Occupational History  . Not on file.   Social History Main Topics  . Smoking status: Current Some Day Smoker -- 0.50 packs/day for 10 years    Types: Cigarettes  . Smokeless tobacco: Never Used     Comment: uses E-cigs  . Alcohol Use: No  . Drug Use: No  . Sexual Activity: Not on file   Other Topics Concern  . Not on file   Social History Narrative  . No narrative on file      ROS:  General: Negative for anorexia, fever, chills, fatigue, weakness. See hpi. Eyes: Negative for vision changes.  ENT: Negative for hoarseness, difficulty  swallowing , nasal congestion. CV: Negative for chest pain, angina, palpitations, dyspnea on exertion, peripheral edema.  Respiratory: Negative for dyspnea at rest, dyspnea on exertion, cough, sputum, wheezing.  GI: See history of present illness. GU:  Negative for dysuria, hematuria, urinary incontinence, urinary frequency, nocturnal urination.  MS: Negative for joint pain, low back pain.  Derm: Negative for rash or itching.  Neuro: Negative for weakness, abnormal sensation, seizure, frequent headaches, memory loss, confusion.  Psych: Negative for depression, suicidal ideation, hallucinations. +anxiety and panic attacks.  Endo: see hpi Heme: Negative for bruising or bleeding. Allergy: Negative for rash or hives.    Physical Examination:  BP 129/85  Pulse 79  Temp(Src) 97.3 F (36.3 C) (Oral)  Ht 5\' 7"  (1.702 m)  Wt 155 lb 3.2 oz (70.398 kg)  BMI 24.30 kg/m2   General: Well-nourished, well-developed in no acute distress.  Head: Normocephalic, atraumatic.   Eyes: Conjunctiva pink, no icterus. Mouth: Oropharyngeal mucosa moist and pink , no lesions erythema or exudate. Neck: Supple without thyromegaly, masses, or lymphadenopathy.  Lungs: Clear to auscultation bilaterally.  Heart: Regular rate and rhythm, no murmurs rubs or gallops.  Abdomen: Bowel sounds are normal, nontender, nondistended, no hepatosplenomegaly or masses, no abdominal bruits or    hernia , no rebound or guarding.   Rectal: not performed. Patient refused.  Extremities: No lower extremity edema. No clubbing or deformities.  Neuro: Alert and oriented x 4 , grossly normal neurologically.  Skin: Warm and dry, no rash or jaundice.   Psych: Alert and cooperative. Tearful and anxious. Labs: Lab Results  Component Value Date   WBC 6.3 10/23/2013   HGB 14.1 10/23/2013   HCT 41.6 10/23/2013   MCV 88.9 10/23/2013   PLT 283 10/23/2013   Lab Results  Component Value Date   CREATININE 0.71 10/23/2013   BUN 5* 10/23/2013    NA 140 10/23/2013   K 3.7 10/23/2013   CL 101 10/23/2013   CO2 25 10/23/2013   Lab Results  Component Value Date   ALT 12 10/23/2013   AST 18 10/23/2013   ALKPHOS 88 10/23/2013   BILITOT 0.4 10/23/2013     Imaging Studies: No results found.

## 2013-10-27 NOTE — Assessment & Plan Note (Signed)
58 year old lady with 2 year history of intermittent rectal pain which she was able to cope with previously. For the past 2 months she's has increased in severity of the rectal pain which is become debilitating. Associated with fear of eating, weight loss, severe anxiety, rectal bleeding. Unfortunately she does not allow rectal exam. She describes having protrusion of tissue from the anorectal canal which she pushes back in after each bowel movement. This is associated with severe discomfort which lasts for hours and posterior in the bed for the rest of the day. Denies hard stool. She is pain free when she wakes up the morning until her first bowel movement. Doubt we are dealing with perirectal abscess given chronicity of symptoms and periods of being asymptomatic. Differential would include anorectal fissure, hemorrhoids, rectal prolapse, malignancy, IBD. Recommend colonoscopy in the near future for further evaluation. Given significant and side he, plan for deep sedation in the OR. She is also been on lorazepam and narcotics in the past couple months for the pain and panic attacks.  I have discussed the risks, alternatives, benefits with regards to but not limited to the risk of reaction to medication, bleeding, infection, perforation and the patient is agreeable to proceed. Written consent to be obtained.  Possible hemorrhoid banding if appropriate.  Start Linzess and Anamantle.   LLQ pain minimal on exam. Doubt diverticulitis. TCS as planned. If exam is unremarkable, consider CT as next step.

## 2013-10-27 NOTE — Patient Instructions (Signed)
Danielle Morris  10/27/2013   Your procedure is scheduled on:  11/02/13  Report to [facility] at 08:30 AM.  Call this number if you have problems the morning of surgery: [phone]   Remember:   Do not eat food or drink liquids after midnight.   Take these medicines the morning of surgery with A SIP OF WATER:    Do not wear jewelry, make-up or nail polish.  Do not wear lotions, powders, or perfumes. You may wear deodorant.  Do not shave 48 hours prior to surgery. Men may shave face and neck.  Do not bring valuables to the hospital.  [facility] is not responsible for any belongings or valuables.               Contacts, dentures or bridgework may not be worn into surgery.  Leave suitcase in the car. After surgery it may be brought to your room.  For patients admitted to the hospital, discharge time is determined by your treatment team.               Patients discharged the day of surgery will not be allowed to drive home.    Special Instructions: N/A   Please read over the following fact sheets that you were given: Anesthesia Post-op Instructions and Care and Recovery After Surgery    Colonoscopy A colonoscopy is an exam to look at the entire large intestine (colon). This exam can help find problems such as tumors, polyps, inflammation, and areas of bleeding. The exam takes about 1 hour.  LET [facility] CARE PROVIDER KNOW ABOUT:   Any allergies you have.  All medicines you are taking, including vitamins, herbs, eye drops, creams, and over-the-counter medicines.  Previous problems you or members of your family have had with the use of anesthetics.  Any blood disorders you have.  Previous surgeries you have had.  Medical conditions you have. RISKS AND COMPLICATIONS  Generally, this is a safe procedure. However, as with any procedure, complications can occur. Possible complications include:  Bleeding.  Tearing or rupture of the colon wall.  Reaction to medicines given  during the exam.  Infection (rare). BEFORE THE PROCEDURE   Ask your health care provider about changing or stopping your regular medicines.  You may be prescribed an oral bowel prep. This involves drinking a large amount of medicated liquid, starting the day before your procedure. The liquid will cause you to have multiple loose stools until your stool is almost clear or light green. This cleans out your colon in preparation for the procedure.  Do not eat or drink anything else once you have started the bowel prep, unless your health care provider tells you it is safe to do so.  Arrange for someone to drive you home after the procedure. PROCEDURE   You will be given medicine to help you relax (sedative).  You will lie on your side with your knees bent.  A long, flexible tube with a light and camera on the end (colonoscope) will be inserted through the rectum and into the colon. The camera sends video back to a computer screen as it moves through the colon. The colonoscope also releases carbon dioxide gas to inflate the colon. This helps your health care provider see the area better.  During the exam, your health care provider may take a small tissue sample (biopsy) to be examined under a microscope if any abnormalities are found.  The exam is finished when the entire colon  has been viewed. AFTER THE PROCEDURE   Do not drive for 24 hours after the exam.  You may have a small amount of blood in your stool.  You may pass moderate amounts of gas and have mild abdominal cramping or bloating. This is caused by the gas used to inflate your colon during the exam.  Ask when your test results will be ready and how you will get your results. Make sure you get your test results. Document Released: 05/24/2000 Document Revised: 03/17/2013 Document Reviewed: 02/01/2013 [facility] Patient Information 2014 [place].    PATIENT INSTRUCTIONS POST-ANESTHESIA  IMMEDIATELY FOLLOWING SURGERY:   Do not drive or operate machinery for the first twenty four hours after surgery.  Do not make any important decisions for twenty four hours after surgery or while taking narcotic pain medications or sedatives.  If you develop intractable nausea and vomiting or a severe headache please notify your doctor immediately.  FOLLOW-UP:  Please make an appointment with your surgeon as instructed. You do not need to follow up with anesthesia unless specifically instructed to do so.  WOUND CARE INSTRUCTIONS (if applicable):  Keep a dry clean dressing on the anesthesia/puncture wound site if there is drainage.  Once the wound has quit draining you may leave it open to air.  Generally you should leave the bandage intact for twenty four hours unless there is drainage.  If the epidural site drains for more than 36-48 hours please call the anesthesia department.  QUESTIONS?:  Please feel free to call your physician or the hospital operator if you have any questions, and they will be happy to assist you.

## 2013-10-27 NOTE — Patient Instructions (Signed)
1. Colonoscopy as scheduled. See separate instructions. 2. Start Linzess one daily before breakfast to soften stools. Samples provided. Also sent prescription to pharmacy. Be sure to use rebate card. 3. Start Anamantle twice daily to anorectal area. Prescription sent to pharmacy.

## 2013-10-28 ENCOUNTER — Encounter (HOSPITAL_COMMUNITY): Payer: Self-pay

## 2013-10-28 ENCOUNTER — Encounter (HOSPITAL_COMMUNITY)
Admission: RE | Admit: 2013-10-28 | Discharge: 2013-10-28 | Disposition: A | Discharge status: Home / Self Care | Payer: BC Managed Care – PPO | Source: Ambulatory Visit | Attending: Gastroenterology | Admitting: Gastroenterology

## 2013-10-28 DIAGNOSIS — Z01812 Encounter for preprocedural laboratory examination: Secondary | ICD-10-CM | POA: Insufficient documentation

## 2013-10-28 DIAGNOSIS — Z01810 Encounter for preprocedural cardiovascular examination: Secondary | ICD-10-CM | POA: Insufficient documentation

## 2013-11-02 ENCOUNTER — Ambulatory Visit (HOSPITAL_COMMUNITY): Payer: BC Managed Care – PPO | Admitting: Anesthesiology

## 2013-11-02 ENCOUNTER — Ambulatory Visit (HOSPITAL_COMMUNITY)
Admission: RE | Admit: 2013-11-02 | Discharge: 2013-11-02 | Disposition: A | Discharge status: Home / Self Care | Payer: BC Managed Care – PPO | Source: Ambulatory Visit | Attending: Gastroenterology | Admitting: Gastroenterology

## 2013-11-02 ENCOUNTER — Encounter (HOSPITAL_COMMUNITY): Payer: BC Managed Care – PPO | Admitting: Anesthesiology

## 2013-11-02 ENCOUNTER — Encounter (HOSPITAL_COMMUNITY): Payer: Self-pay

## 2013-11-02 ENCOUNTER — Telehealth: Payer: Self-pay | Admitting: Gastroenterology

## 2013-11-02 ENCOUNTER — Other Ambulatory Visit: Payer: Self-pay | Admitting: Gastroenterology

## 2013-11-02 ENCOUNTER — Encounter (HOSPITAL_COMMUNITY)
Admission: RE | Disposition: A | Discharge status: Home / Self Care | Payer: Self-pay | Source: Ambulatory Visit | Attending: Gastroenterology

## 2013-11-02 ENCOUNTER — Encounter: Payer: Self-pay | Admitting: Family Medicine

## 2013-11-02 DIAGNOSIS — K6289 Other specified diseases of anus and rectum: Secondary | ICD-10-CM

## 2013-11-02 DIAGNOSIS — D126 Benign neoplasm of colon, unspecified: Secondary | ICD-10-CM | POA: Insufficient documentation

## 2013-11-02 DIAGNOSIS — K644 Residual hemorrhoidal skin tags: Secondary | ICD-10-CM

## 2013-11-02 DIAGNOSIS — F17200 Nicotine dependence, unspecified, uncomplicated: Secondary | ICD-10-CM | POA: Diagnosis not present

## 2013-11-02 DIAGNOSIS — F411 Generalized anxiety disorder: Secondary | ICD-10-CM | POA: Diagnosis not present

## 2013-11-02 DIAGNOSIS — Z79899 Other long term (current) drug therapy: Secondary | ICD-10-CM | POA: Insufficient documentation

## 2013-11-02 DIAGNOSIS — D128 Benign neoplasm of rectum: Secondary | ICD-10-CM | POA: Insufficient documentation

## 2013-11-02 DIAGNOSIS — Z888 Allergy status to other drugs, medicaments and biological substances status: Secondary | ICD-10-CM | POA: Insufficient documentation

## 2013-11-02 DIAGNOSIS — I10 Essential (primary) hypertension: Secondary | ICD-10-CM | POA: Insufficient documentation

## 2013-11-02 DIAGNOSIS — K625 Hemorrhage of anus and rectum: Secondary | ICD-10-CM | POA: Insufficient documentation

## 2013-11-02 DIAGNOSIS — D129 Benign neoplasm of anus and anal canal: Secondary | ICD-10-CM | POA: Diagnosis not present

## 2013-11-02 HISTORY — PX: COLONOSCOPY WITH PROPOFOL: SHX5780

## 2013-11-02 HISTORY — PX: POLYPECTOMY: SHX5525

## 2013-11-02 HISTORY — PX: HEMORRHOID BANDING: SHX5850

## 2013-11-02 LAB — HM COLONOSCOPY

## 2013-11-02 SURGERY — COLONOSCOPY WITH PROPOFOL
Anesthesia: Monitor Anesthesia Care | Site: Rectum

## 2013-11-02 MED ORDER — MIDAZOLAM HCL 2 MG/2ML IJ SOLN
INTRAMUSCULAR | Status: AC
Start: 2013-11-02 — End: 2013-11-02
  Filled 2013-11-02: qty 2

## 2013-11-02 MED ORDER — MIDAZOLAM HCL 2 MG/2ML IJ SOLN
INTRAMUSCULAR | Status: AC
  Filled 2013-11-02: qty 2

## 2013-11-02 MED ORDER — PROPOFOL INFUSION 10 MG/ML OPTIME
INTRAVENOUS | Status: DC | PRN
  Administered 2013-11-02: 10:00:00 via INTRAVENOUS
  Administered 2013-11-02: 50 ug/kg/min via INTRAVENOUS

## 2013-11-02 MED ORDER — GLYCOPYRROLATE 0.2 MG/ML IJ SOLN
0.2000 mg | Freq: Once | INTRAMUSCULAR | Status: AC
  Administered 2013-11-02: 0.2 mg via INTRAVENOUS

## 2013-11-02 MED ORDER — SODIUM CHLORIDE 0.9 % IJ SOLN
PREFILLED_SYRINGE | INTRAMUSCULAR | Status: DC | PRN
  Administered 2013-11-02: 10:00:00

## 2013-11-02 MED ORDER — EPINEPHRINE HCL 0.1 MG/ML IJ SOSY
PREFILLED_SYRINGE | INTRAMUSCULAR | Status: DC
Start: 2013-11-02 — End: 2013-11-02
  Filled 2013-11-02: qty 10

## 2013-11-02 MED ORDER — LACTATED RINGERS IV SOLN
INTRAVENOUS | Status: DC
  Administered 2013-11-02: 10:00:00 via INTRAVENOUS
  Administered 2013-11-02: 1000 mL via INTRAVENOUS

## 2013-11-02 MED ORDER — HYDROCORTISONE ACE-PRAMOXINE 1-1 % RE CREA: TOPICAL_CREAM | RECTAL | Status: DC

## 2013-11-02 MED ORDER — FENTANYL CITRATE 0.05 MG/ML IJ SOLN
INTRAMUSCULAR | Status: AC
  Filled 2013-11-02: qty 2

## 2013-11-02 MED ORDER — STERILE WATER FOR IRRIGATION IR SOLN
Status: DC | PRN
  Administered 2013-11-02: 10:00:00

## 2013-11-02 MED ORDER — PROPOFOL 10 MG/ML IV EMUL
INTRAVENOUS | Status: AC
  Filled 2013-11-02: qty 20

## 2013-11-02 MED ORDER — ONDANSETRON HCL 4 MG/2ML IJ SOLN: 4.0000 mg | Freq: Once | INTRAMUSCULAR | Status: DC | PRN

## 2013-11-02 MED ORDER — FENTANYL CITRATE 0.05 MG/ML IJ SOLN
25.0000 ug | INTRAMUSCULAR | Status: DC | PRN
  Administered 2013-11-02 (×2): 50 ug via INTRAVENOUS

## 2013-11-02 MED ORDER — ONDANSETRON HCL 4 MG/2ML IJ SOLN
INTRAMUSCULAR | Status: AC
  Filled 2013-11-02: qty 2

## 2013-11-02 MED ORDER — MIDAZOLAM HCL 2 MG/2ML IJ SOLN
1.0000 mg | INTRAMUSCULAR | Status: DC | PRN
  Administered 2013-11-02: 2 mg via INTRAVENOUS

## 2013-11-02 MED ORDER — GLYCOPYRROLATE 0.2 MG/ML IJ SOLN
INTRAMUSCULAR | Status: AC
  Filled 2013-11-02: qty 1

## 2013-11-02 MED ORDER — FENTANYL CITRATE 0.05 MG/ML IJ SOLN
25.0000 ug | INTRAMUSCULAR | Status: AC
  Administered 2013-11-02 (×2): 25 ug via INTRAVENOUS

## 2013-11-02 MED ORDER — MIDAZOLAM HCL 5 MG/5ML IJ SOLN
INTRAMUSCULAR | Status: DC | PRN
  Administered 2013-11-02 (×2): 1 mg via INTRAVENOUS
  Administered 2013-11-02: 2 mg via INTRAVENOUS
  Administered 2013-11-02 (×2): 1 mg via INTRAVENOUS

## 2013-11-02 MED ORDER — ONDANSETRON HCL 4 MG/2ML IJ SOLN
4.0000 mg | Freq: Once | INTRAMUSCULAR | Status: AC
  Administered 2013-11-02: 4 mg via INTRAVENOUS

## 2013-11-02 SURGICAL SUPPLY — 19 items
DEVICE CLIP HEMOSTAT 235CM (CLIP) ×4 IMPLANT
ELECT REM PT RETURN 9FT ADLT (ELECTROSURGICAL) ×2
ELECTRODE REM PT RTRN 9FT ADLT (ELECTROSURGICAL) ×1 IMPLANT
FLOOR PAD 36X40 (MISCELLANEOUS) ×2
FORCEPS BIOP RAD 4 LRG CAP 4 (CUTTING FORCEPS) ×2 IMPLANT
FORMALIN 10 PREFIL 20ML (MISCELLANEOUS) ×10 IMPLANT
KIT CLEAN ENDO COMPLIANCE (KITS) ×2 IMPLANT
LUBRICANT JELLY 4.5OZ STERILE (MISCELLANEOUS) ×2 IMPLANT
MANIFOLD NEPTUNE II (INSTRUMENTS) ×2 IMPLANT
MARKER SPOT ENDO TATTOO 5ML (MISCELLANEOUS) ×1 IMPLANT
NEEDLE SCLEROTHERAPY 25GX240 (NEEDLE) ×2 IMPLANT
PAD FLOOR 36X40 (MISCELLANEOUS) ×1 IMPLANT
SNARE ROTATE MED OVAL 20MM (MISCELLANEOUS) ×2 IMPLANT
SPOT EX ENDOSCOPIC TATTOO (MISCELLANEOUS) ×1
SYR 50ML LL SCALE MARK (SYRINGE) ×2 IMPLANT
TRAP SPECIMEN MUCOUS 40CC (MISCELLANEOUS) ×2 IMPLANT
TUBING ENDO SMARTCAP PENTAX (MISCELLANEOUS) ×2 IMPLANT
TUBING IRRIGATION ENDOGATOR (MISCELLANEOUS) ×2 IMPLANT
WATER STERILE IRR 1000ML POUR (IV SOLUTION) ×2 IMPLANT

## 2013-11-02 NOTE — Anesthesia Postprocedure Evaluation (Signed)
  Anesthesia Post-op Note  Patient: Danielle Morris  Procedure(s) Performed: Procedure(s) with comments: COLONOSCOPY WITH PROPOFOL (N/A) - in cecum @ 9604; cecal withdrawal time- minutes HEMORRHOID BANDING (N/A) POLYPECTOMY (N/A) - sigmoid colon, hepatic flexure, hyperplastic rectal, and rectal polyps  Patient Location: PACU  Anesthesia Type:MAC  Level of Consciousness: awake, alert , oriented and patient cooperative  Airway and Oxygen Therapy: Patient Spontanous Breathing  Post-op Pain: 2 /10, mild  Post-op Assessment: Post-op Vital signs reviewed, Patient's Cardiovascular Status Stable, Respiratory Function Stable, Patent Airway and NAUSEA AND VOMITING PRESENT  Post-op Vital Signs: Reviewed and stable  Last Vitals:  Filed Vitals:   11/02/13 0930  BP: 112/77  Pulse:   Temp:   Resp: 32    Complications: No apparent anesthesia complications

## 2013-11-02 NOTE — H&P (Signed)
  Primary Care Physician:  Karis Juba, PA-C Primary Gastroenterologist:  Dr. Oneida Alar  Pre-Procedure History & Physical: HPI:  Danielle Morris is a 58 y.o. female here for RECTAL BLEEDING/PAIN.  Past Medical History  Diagnosis Date  . Hypertension   . Panic attack     Past Surgical History  Procedure Laterality Date  . Tonsillectomy      age  75    Prior to Admission medications   Medication Sig Start Date End Date Taking? Authorizing Provider  docusate sodium (STOOL SOFTENER) 100 MG capsule Take 100 mg by mouth 2 (two) times daily.    Yes Historical Provider, MD  doxylamine, Sleep, (UNISOM) 25 MG tablet Take 25 mg by mouth at bedtime as needed for sleep.    Yes Historical Provider, MD  HYDROcodone-acetaminophen (NORCO/VICODIN) 5-325 MG per tablet Take 2 tablets by mouth every 4 (four) hours as needed. 10/23/13  Yes Ezequiel Essex, MD  Lidocaine-Hydrocortisone Ace 3-2.5 % KIT Place 1 Applicatorful rectally 2 (two) times daily. 10/27/13  Yes Mahala Menghini, PA-C  LORazepam (ATIVAN) 1 MG tablet Take 1 tablet (1 mg total) by mouth 2 (two) times daily as needed for anxiety. 10/14/13  Yes Mary B Dixon, PA-C  losartan (COZAAR) 50 MG tablet Take 50 mg by mouth daily.   Yes Historical Provider, MD  peg 3350 powder (MOVIPREP) 100 G SOLR Take 1 kit (200 g total) by mouth as directed. 10/27/13  Yes Danie Binder, MD  Linaclotide (LINZESS) 145 MCG CAPS capsule Take 1 capsule (145 mcg total) by mouth daily. 10/27/13   Mahala Menghini, PA-C    Allergies as of 10/27/2013 - Review Complete 10/27/2013  Allergen Reaction Noted  . Amlodipine Shortness Of Breath, Anxiety, and Palpitations 08/11/2013  . Lisinopril Other (See Comments) 09/24/2013  . Chantix [varenicline] Nausea Only and Anxiety 06/22/2013    Family History  Problem Relation Age of Onset  . Heart disease Father   . Diabetes Other     History   Social History  . Marital Status: Married    Spouse Name: N/A    Number of Children:  N/A  . Years of Education: N/A   Occupational History  . Not on file.   Social History Main Topics  . Smoking status: Current Some Day Smoker -- 0.50 packs/day for 10 years    Types: Cigarettes  . Smokeless tobacco: Never Used     Comment: uses E-cigs  . Alcohol Use: No  . Drug Use: No  . Sexual Activity: Not on file   Other Topics Concern  . Not on file   Social History Narrative  . No narrative on file    Review of Systems: See HPI, otherwise negative ROS   Physical Exam: BP 115/75  Pulse 79  Temp(Src) 98.2 F (36.8 C) (Oral)  Resp 18  SpO2 98% General:   Alert,  pleasant and cooperative in NAD Head:  Normocephalic and atraumatic. Neck:  Supple; Lungs:  Clear throughout to auscultation.    Heart:  Regular rate and rhythm. Abdomen:  Soft, nontender and nondistended. Normal bowel sounds, without guarding, and without rebound.   Neurologic:  Alert and  oriented x4;  grossly normal neurologically.  Impression/Plan:    RECTAL BLEEDING/PAIN  PLAN:  1. TCS/? Hemorrhoid banding TODAY

## 2013-11-02 NOTE — Transfer of Care (Signed)
Immediate Anesthesia Transfer of Care Note  Patient: Danielle Morris  Procedure(s) Performed: Procedure(s) with comments: COLONOSCOPY WITH PROPOFOL (N/A) - in cecum @ 2863; cecal withdrawal time- minutes HEMORRHOID BANDING (N/A) POLYPECTOMY (N/A) - sigmoid colon, hepatic flexure, hyperplastic rectal, and rectal polyps  Patient Location: PACU  Anesthesia Type:MAC  Level of Consciousness: awake and patient cooperative  Airway & Oxygen Therapy: Patient Spontanous Breathing and Patient connected to face mask oxygen  Post-op Assessment: Report given to PACU RN, Post -op Vital signs reviewed and stable and Patient moving all extremities  Post vital signs: Reviewed and stable  Complications: No apparent anesthesia complications

## 2013-11-02 NOTE — Anesthesia Preprocedure Evaluation (Signed)
Anesthesia Evaluation  Patient identified by MRN, date of birth, ID band Patient awake    Reviewed: Allergy & Precautions, H&P , NPO status , Patient's Chart, lab work & pertinent test results  Airway Mallampati: II TM Distance: >3 FB Neck ROM: Full    Dental  (+) Teeth Intact   Pulmonary Current Smoker,  breath sounds clear to auscultation        Cardiovascular hypertension, Pt. on medications Rhythm:Regular Rate:Normal     Neuro/Psych PSYCHIATRIC DISORDERS Anxiety    GI/Hepatic negative GI ROS,   Endo/Other    Renal/GU      Musculoskeletal   Abdominal   Peds  Hematology   Anesthesia Other Findings   Reproductive/Obstetrics                           Anesthesia Physical Anesthesia Plan  ASA: II  Anesthesia Plan: MAC   Post-op Pain Management:    Induction: Intravenous  Airway Management Planned: Simple Face Mask  Additional Equipment:   Intra-op Plan:   Post-operative Plan:   Informed Consent: I have reviewed the patients History and Physical, chart, labs and discussed the procedure including the risks, benefits and alternatives for the proposed anesthesia with the patient or authorized representative who has indicated his/her understanding and acceptance.     Plan Discussed with:   Anesthesia Plan Comments:         Anesthesia Quick Evaluation

## 2013-11-02 NOTE — Discharge Instructions (Signed)
You had 5 large and 3 small polyps removed. The polyps were the likely cause for your INTERMITTENT RECTAL BLEEDING. I PLACED 2 CLIPS AT THE BASE OF THE RECTAL POLYP TO KEEP THEM FROM BLEEDING IN 7-10 DAYS. YOU HAVE LARGE EXTERNAL HEMORRHOIDS THAT ARE CAUSING RECTAL PRESSURE/PAIN.      NO MRI FOR 30 DAYS.  YOU WILL NEED TO SEE A SURGEON REGARDING YOUR EXTERNAL HEMORRHOIDS.   USE PROCTO-CREAM 4 TIMES A DAY  OR ANAMANTLE TWICE DAILY TO RELIEVE HEMORRHOID PAIN/PRESSURE.  AVOID CONSTIPATION.  FOLLOW A HIGH FIBER DIET. SEE INFO BELOW.  DRINK WATER TO KEEP YOUR URINE LIGHT YELLOW.  YOU MAY CONTINUE LINZESS TO PREVENT CONSTIPATION AND COLACE TO SOFTEN STOOL.  YOUR BIOPSY RESULTS SHOULD BE BACK IN 7 DAYS.  Next colonoscopy in 1-3 years. YOUR SISTERS, BROTHERS, CHILDREN, AND PARENTS NEED TO HAVE A COLONOSCOPY STARTING AT THE AGE OF 40.     Colonoscopy Care After Read the instructions outlined below and refer to this sheet in the next week. These discharge instructions provide you with general information on caring for yourself after you leave the hospital. While your treatment has been planned according to the most current medical practices available, unavoidable complications occasionally occur. If you have any problems or questions after discharge, call DR. Brayam Boeke, [phone].  ACTIVITY  You may resume your regular activity, but move at a slower pace for the next 24 hours.   Take frequent rest periods for the next 24 hours.   Walking will help get rid of the air and reduce the bloated feeling in your belly (abdomen).   No driving for 24 hours (because of the medicine (anesthesia) used during the test).   You may shower.   Do not sign any important legal documents or operate any machinery for 24 hours (because of the anesthesia used during the test).    NUTRITION  Drink plenty of fluids.   You may resume your normal diet as instructed by your doctor.   Begin with a light  meal and progress to your normal diet. Heavy or fried foods are harder to digest and may make you feel sick to your stomach (nauseated).   Avoid alcoholic beverages for 24 hours or as instructed.    MEDICATIONS  You may resume your normal medications.   WHAT YOU CAN EXPECT TODAY  Some feelings of bloating in the abdomen.   Passage of more gas than usual.   Spotting of blood in your stool or on the toilet paper  .  IF YOU HAD POLYPS REMOVED DURING THE COLONOSCOPY:  Eat a soft diet IF YOU HAVE NAUSEA, BLOATING, ABDOMINAL PAIN, OR VOMITING.    FINDING OUT THE RESULTS OF YOUR TEST Not all test results are available during your visit. DR. Oneida Alar WILL CALL YOU WITHIN 7 DAYS OF YOUR PROCEDUE WITH YOUR RESULTS. Do not assume everything is normal if you have not heard from DR. Alyxandria Wentz IN ONE WEEK, CALL HER OFFICE AT [phone].  SEEK IMMEDIATE MEDICAL ATTENTION AND CALL THE OFFICE: [phone] IF:  You have more than a spotting of blood in your stool.   Your belly is swollen (abdominal distention).   You are nauseated or vomiting.   You have a temperature over 101F.   You have abdominal pain or discomfort that is severe or gets worse throughout the day.   High-Fiber Diet A high-fiber diet changes your normal diet to include more whole grains, legumes, fruits, and vegetables. Changes in the diet involve replacing  refined carbohydrates with unrefined foods. The calorie level of the diet is essentially unchanged. The Dietary Reference Intake (recommended amount) for adult males is 38 grams per day. For adult females, it is 25 grams per day. Pregnant and lactating women should consume 28 grams of fiber per day. Fiber is the intact part of a plant that is not broken down during digestion. Functional fiber is fiber that has been isolated from the plant to provide a beneficial effect in the body. PURPOSE  Increase stool bulk.   Ease and regulate bowel movements.   Lower  cholesterol.  INDICATIONS THAT YOU NEED MORE FIBER  Constipation and hemorrhoids.   Uncomplicated diverticulosis (intestine condition) and irritable bowel syndrome.   Weight management.   As a protective measure against hardening of the arteries (atherosclerosis), diabetes, and cancer.   GUIDELINES FOR INCREASING FIBER IN THE DIET  Start adding fiber to the diet slowly. A gradual increase of about 5 more grams (2 slices of whole-wheat bread, 2 servings of most fruits or vegetables, or 1 bowl of high-fiber cereal) per day is best. Too rapid an increase in fiber may result in constipation, flatulence, and bloating.   Drink enough water and fluids to keep your urine clear or pale yellow. Water, juice, or caffeine-free drinks are recommended. Not drinking enough fluid may cause constipation.   Eat a variety of high-fiber foods rather than one type of fiber.   Try to increase your intake of fiber through using high-fiber foods rather than fiber pills or supplements that contain small amounts of fiber.   The goal is to change the types of food eaten. Do not supplement your present diet with high-fiber foods, but replace foods in your present diet.  INCLUDE A VARIETY OF FIBER SOURCES  Replace refined and processed grains with whole grains, canned fruits with fresh fruits, and incorporate other fiber sources. White rice, white breads, and most bakery goods contain little or no fiber.   Brown whole-grain rice, buckwheat oats, and many fruits and vegetables are all good sources of fiber. These include: broccoli, Brussels sprouts, cabbage, cauliflower, beets, sweet potatoes, white potatoes (skin on), carrots, tomatoes, eggplant, squash, berries, fresh fruits, and dried fruits.   Cereals appear to be the richest source of fiber. Cereal fiber is found in whole grains and bran. Bran is the fiber-rich outer coat of cereal grain, which is largely removed in refining. In whole-grain cereals, the bran  remains. In breakfast cereals, the largest amount of fiber is found in those with "bran" in their names. The fiber content is sometimes indicated on the label.   You may need to include additional fruits and vegetables each day.   In baking, for 1 cup white flour, you may use the following substitutions:   1 cup whole-wheat flour minus 2 tablespoons.   1/2 cup white flour plus 1/2 cup whole-wheat flour.   Polyps, Colon  A polyp is extra tissue that grows inside your body. Colon polyps grow in the large intestine. The large intestine, also called the colon, is part of your digestive system. It is a long, hollow tube at the end of your digestive tract where your body makes and stores stool. Most polyps are not dangerous. They are benign. This means they are not cancerous. But over time, some types of polyps can turn into cancer. Polyps that are smaller than a pea are usually not harmful. But larger polyps could someday become or may already be cancerous. To be safe, doctors remove  all polyps and test them.   WHO GETS POLYPS? Anyone can get polyps, but certain people are more likely than others. You may have a greater chance of getting polyps if:  You are over 50.   You have had polyps before.   Someone in your family has had polyps.   Someone in your family has had cancer of the large intestine.   Find out if someone in your family has had polyps. You may also be more likely to get polyps if you:   Eat a lot of fatty foods   Smoke   Drink alcohol   Do not exercise  Eat too much   TREATMENT  The caregiver will remove the polyp during sigmoidoscopy or colonoscopy.    PREVENTION There is not one sure way to prevent polyps. You might be able to lower your risk of getting them if you:  Eat more fruits and vegetables and less fatty food.   Do not smoke.   Avoid alcohol.   Exercise every day.   Lose weight if you are overweight.   Eating more calcium and folate can also  lower your risk of getting polyps. Some foods that are rich in calcium are milk, cheese, and broccoli. Some foods that are rich in folate are chickpeas, kidney beans, and spinach.   Hemorrhoids Hemorrhoids are dilated (enlarged) veins around the rectum. Sometimes clots will form in the veins. This makes them swollen and painful. These are called thrombosed hemorrhoids. Causes of hemorrhoids include:  Constipation.   Straining to have a bowel movement.   HEAVY LIFTING HOME CARE INSTRUCTIONS  Eat a well balanced diet and drink 6 to 8 glasses of water every day to avoid constipation. You may also use a bulk laxative.   Avoid straining to have bowel movements.   Keep anal area dry and clean.   Do not use a donut shaped pillow or sit on the toilet for long periods. This increases blood pooling and pain.   Move your bowels when your body has the urge; this will require less straining and will decrease pain and pressure.

## 2013-11-02 NOTE — Telephone Encounter (Signed)
Message copied by Rodney Langton on Tue Nov 02, 2013 11:55 AM ------      Message from: Danie Binder      Created: Tue Nov 02, 2013 11:29 AM       Pt needs appt with Dr. Arnoldo Morale this week if possible Dx: rectal pain/pressure-large external hemorrhoids. ------

## 2013-11-02 NOTE — Op Note (Signed)
[facility] [address] [place], [address]   COLONOSCOPY PROCEDURE REPORT  PATIENT: Danielle, Morris  MR#: [id number] BIRTHDATE: 08-22-1955 , 33  yrs. old GENDER: Female ENDOSCOPIST: Barney Drain, MD REFERRED EV:OJJK Ardath Sax, PA-C PROCEDURE DATE:  11/02/2013 PROCEDURE:   Colonoscopy with snare polypectomy, with cold biopsy polypectomy, and Submucosal injection(SPOT/EPI) INDICATIONS:Rectal Bleeding and RECTAL PAIN/PRESSURE.  AFRAID TO HAVE BMs DUE TO PAIN WITH PASSING STOOL. MEDICATIONS: MAC sedation, administered by CRNA  DESCRIPTION OF PROCEDURE:    Physical exam was performed.  Informed consent was obtained from the patient after explaining the benefits, risks, and alternatives to procedure.  The patient was connected to monitor and placed in left lateral position. Continuous oxygen was provided by nasal cannula and IV medicine administered through an indwelling cannula.  After administration of sedation and rectal exam, the patients rectum was intubated and the     colonoscope was advanced under direct visualization to the ileum.  The scope was removed slowly by carefully examining the color, texture, anatomy, and integrity mucosa on the way out.  The patient was recovered in endoscopy and discharged home in satisfactory condition.    COLON FINDINGS: The mucosa appeared normal in the terminal ileum.  , Five sessile polyps measuring 8-12 mm in size were found at the hepatic flexure(8), in the sigmoid colon(8 MMx2), and rectum(1.5CM, 1 CM). BASE OF RECTAL POLYPS TATTOOED AND EPI INJECTED. ONE HEMOCLIP PLACED AT BASE OF EACH RECTAL POLYP SITES.  A polypectomy was performed using snare cautery.  , Three sessile polyps measuring 2-3 mm in size were found in the rectum.  A polypectomy was performed with cold forceps.  , The colon mucosa was otherwise normal.  , and Large external hemorrhoids were found.  REDUNDANT RECTOSIGMOID JUNCTION. PRESSURE APPLEID TO  ADVANCE SCOPE TO CECUM.  PREP QUALITY: excellent. CECAL W/D TIME: 29 minutes COMPLICATIONS: None  ENDOSCOPIC IMPRESSION: 1.   RECTAL PAIN/PRESSURE DUE TO EXTERNAL HEMORRHOIDS 2.   RECTAL BLEEDING DUE TO RECTAL POLYPS.  EIGHT COLORECTAL POLYPS REMOVED 3.   Large external hemorrhoids  RECOMMENDATIONS: NO MRI FOR 30 DAYS.  SEE A SURGEON REGARDING EXTERNAL HEMORRHOIDS. USE PROCTO-CREAM 4 TIMES A DAY OR ANAMANTLE TWICE DAILY TO RELIEVE HEMORRHOID PAIN/PRESSURE. AVOID CONSTIPATION. FOLLOW A HIGH FIBER DIET. DRINK WATER TO KEEP YOUR URINE LIGHT YELLOW. CONTINUE LINZESS TO PREVENT CONSTIPATION AND COLACE TO SOFTEN STOOL. BIOPSY RESULTS SHOULD BE BACK IN 7 DAYS.  Next colonoscopy in 1-3 years.  ALL SISTERS, BROTHERS, CHILDREN, AND PARENTS NEED TO HAVE A COLONOSCOPY STARTING AT THE AGE OF 40.    _______________________________ Lorrin MaisBarney Drain, MD 11/02/2013 11:28 AM

## 2013-11-02 NOTE — Telephone Encounter (Signed)
Referral has been made to Dr. Arnoldo Morale office

## 2013-11-02 NOTE — Telephone Encounter (Signed)
REVIEWED.  

## 2013-11-02 NOTE — Progress Notes (Signed)
REVIEWED.  

## 2013-11-02 NOTE — Op Note (Signed)
movey prep taken without any complications.  Patient states she is clear

## 2013-11-04 ENCOUNTER — Encounter (HOSPITAL_COMMUNITY): Payer: Self-pay | Admitting: Pharmacy Technician

## 2013-11-04 ENCOUNTER — Ambulatory Visit: Payer: BC Managed Care – PPO | Admitting: Gastroenterology

## 2013-11-04 ENCOUNTER — Ambulatory Visit: Payer: BC Managed Care – HMO | Admitting: Physician Assistant

## 2013-11-04 ENCOUNTER — Encounter (HOSPITAL_COMMUNITY): Payer: Self-pay | Admitting: Gastroenterology

## 2013-11-04 ENCOUNTER — Encounter (HOSPITAL_COMMUNITY)
Admission: RE | Admit: 2013-11-04 | Discharge: 2013-11-04 | Disposition: A | Discharge status: Home / Self Care | Payer: BC Managed Care – PPO | Source: Ambulatory Visit | Attending: General Surgery | Admitting: General Surgery

## 2013-11-04 NOTE — H&P (Signed)
  NTS SOAP Note  Vital Signs:  Vitals as of: 9/50/9326: Systolic 712: Diastolic 87: Heart Rate 87: Temp 97.63F: Height 62ft 7in: Weight 154Lbs 0 Ounces: Pain Level 3: BMI 24.12  BMI : 24.12 kg/m2  Subjective: This 58 Years 17 Months old Female presents for of hemorrhoidal disease.  Has had hemorrhoidal disease for many years, but recently they are very painful.  States they sometimes prolapse.  Had TCS eariler this week which showed external hemorrohoidal disease.    Review of Symptoms:  Constitutional:  fatigue Head:unremarkable    Eyes:  pain bilateral Nose/Mouth/Throat:unremarkable Cardiovascular:  unremarkable   Respiratory:unremarkable   Gastrointestinal:  unremarkable   Genitourinary:unremarkable     Musculoskeletal:unremarkable   Skin:unremarkable Hematolgic/Lymphatic:unremarkable     Allergic/Immunologic:unremarkable     Past Medical History:    Reviewed  Past Medical History  Surgical History: tonsillectomy Medical Problems: HTN Psychiatric History: Anxiety Allergies: amlodipine, lisinopril, chantix Medications: hydrocodone, lidocaine cream, ativan, doxylamine, lorazepam, linzess   Social History:Reviewed  Social History  Preferred Language: English Race:  White Ethnicity: Not Hispanic / Latino Age: 58 Years 7 Months Marital Status:  M Alcohol: unknown   Smoking Status: Current every day smoker reviewed on 11/04/2013 Started Date:  Packs per day: 1.00 Functional Status reviewed on 11/04/2013 ------------------------------------------------ Bathing: Normal Cooking: Normal Dressing: Normal Driving: Normal Eating: Normal Managing Meds: Normal Oral Care: Normal Shopping: Normal Toileting: Normal Transferring: Normal Walking: Normal Cognitive Status reviewed on 11/04/2013 ------------------------------------------------ Attention: Normal Decision Making: Normal Language: Normal Memory: Normal Motor:  Normal Perception: Normal Problem Solving: Normal Visual and Spatial: Normal   Family History:  Reviewed  Family Health History Family History is Unknown    Objective Information: General:  Well appearing, well nourished in no distress. Heart:  RRR, no murmur or gallop.  Normal S1, S2.  No S3, S4.  Lungs:    CTA bilaterally, no wheezes, rhonchi, rales.  Breathing unlabored. Abdomen:Soft, NT/ND, no HSM, no masses.   External hemorrhoids at the 12 o'clock position, exam limited secondary to pain.  Assessment:Hemorrhoidal disease  Diagnoses: 455.3 Interno-external hemorrhoid (Other hemorrhoids)  Procedures: 45809 - OFFICE OUTPATIENT NEW 30 MINUTES    Plan:  Scheduled for extensive hemorrhoidectomy on 11/05/13.   Patient Education:Alternative treatments to surgery were discussed with patient (and family).  Risks and benefits  of procedure including ongoing pain were fully explained to the patient (and family) who gave informed consent. Patient/family questions were addressed.  Follow-up:Pending Surgery

## 2013-11-05 ENCOUNTER — Encounter (HOSPITAL_COMMUNITY): Payer: BC Managed Care – PPO | Admitting: Anesthesiology

## 2013-11-05 ENCOUNTER — Ambulatory Visit (HOSPITAL_COMMUNITY)
Admission: RE | Admit: 2013-11-05 | Discharge: 2013-11-05 | Disposition: A | Discharge status: Home / Self Care | Payer: BC Managed Care – PPO | Source: Ambulatory Visit | Attending: General Surgery | Admitting: General Surgery

## 2013-11-05 ENCOUNTER — Encounter (HOSPITAL_COMMUNITY)
Admission: RE | Disposition: A | Discharge status: Home / Self Care | Payer: Self-pay | Source: Ambulatory Visit | Attending: General Surgery

## 2013-11-05 ENCOUNTER — Encounter (HOSPITAL_COMMUNITY): Payer: Self-pay | Admitting: *Deleted

## 2013-11-05 ENCOUNTER — Ambulatory Visit (HOSPITAL_COMMUNITY): Payer: BC Managed Care – PPO | Admitting: Anesthesiology

## 2013-11-05 DIAGNOSIS — Z79899 Other long term (current) drug therapy: Secondary | ICD-10-CM | POA: Insufficient documentation

## 2013-11-05 DIAGNOSIS — I10 Essential (primary) hypertension: Secondary | ICD-10-CM | POA: Insufficient documentation

## 2013-11-05 DIAGNOSIS — K648 Other hemorrhoids: Secondary | ICD-10-CM | POA: Insufficient documentation

## 2013-11-05 DIAGNOSIS — K644 Residual hemorrhoidal skin tags: Secondary | ICD-10-CM | POA: Insufficient documentation

## 2013-11-05 DIAGNOSIS — F17200 Nicotine dependence, unspecified, uncomplicated: Secondary | ICD-10-CM | POA: Insufficient documentation

## 2013-11-05 DIAGNOSIS — F411 Generalized anxiety disorder: Secondary | ICD-10-CM | POA: Insufficient documentation

## 2013-11-05 HISTORY — PX: HEMORRHOID SURGERY: SHX153

## 2013-11-05 SURGERY — HEMORRHOIDECTOMY
Anesthesia: General | Site: Rectum

## 2013-11-05 MED ORDER — FENTANYL CITRATE 0.05 MG/ML IJ SOLN
INTRAMUSCULAR | Status: DC | PRN
  Administered 2013-11-05: 100 ug via INTRAVENOUS
  Administered 2013-11-05 (×2): 50 ug via INTRAVENOUS

## 2013-11-05 MED ORDER — FENTANYL CITRATE 0.05 MG/ML IJ SOLN
INTRAMUSCULAR | Status: AC
  Filled 2013-11-05: qty 2

## 2013-11-05 MED ORDER — FENTANYL CITRATE 0.05 MG/ML IJ SOLN
25.0000 ug | INTRAMUSCULAR | Status: DC | PRN
  Administered 2013-11-05 (×4): 50 ug via INTRAVENOUS

## 2013-11-05 MED ORDER — ONDANSETRON HCL 4 MG/2ML IJ SOLN
4.0000 mg | Freq: Once | INTRAMUSCULAR | Status: AC
  Administered 2013-11-05: 4 mg via INTRAVENOUS

## 2013-11-05 MED ORDER — SODIUM CHLORIDE 0.9 % IR SOLN
Status: DC | PRN
  Administered 2013-11-05: 1000 mL

## 2013-11-05 MED ORDER — LACTATED RINGERS IV SOLN
INTRAVENOUS | Status: DC
  Administered 2013-11-05: 09:00:00 via INTRAVENOUS

## 2013-11-05 MED ORDER — LIDOCAINE VISCOUS 2 % MT SOLN
OROMUCOSAL | Status: DC | PRN
  Administered 2013-11-05: 1

## 2013-11-05 MED ORDER — ONDANSETRON HCL 4 MG/2ML IJ SOLN
INTRAMUSCULAR | Status: AC
  Filled 2013-11-05: qty 2

## 2013-11-05 MED ORDER — MIDAZOLAM HCL 2 MG/2ML IJ SOLN
1.0000 mg | INTRAMUSCULAR | Status: DC | PRN
  Administered 2013-11-05 (×2): 2 mg via INTRAVENOUS
  Filled 2013-11-05: qty 2

## 2013-11-05 MED ORDER — BUPIVACAINE HCL (PF) 0.5 % IJ SOLN
INTRAMUSCULAR | Status: DC | PRN
  Administered 2013-11-05: 10 mL

## 2013-11-05 MED ORDER — PROPOFOL 10 MG/ML IV BOLUS
INTRAVENOUS | Status: DC | PRN
  Administered 2013-11-05: 200 mg via INTRAVENOUS

## 2013-11-05 MED ORDER — ONDANSETRON HCL 4 MG/2ML IJ SOLN: 4.0000 mg | Freq: Once | INTRAMUSCULAR | Status: DC | PRN

## 2013-11-05 MED ORDER — HEMOSTATIC AGENTS (NO CHARGE) OPTIME
TOPICAL | Status: DC | PRN
  Administered 2013-11-05: 1 via TOPICAL

## 2013-11-05 MED ORDER — KETOROLAC TROMETHAMINE 30 MG/ML IJ SOLN
INTRAMUSCULAR | Status: AC
  Filled 2013-11-05: qty 1

## 2013-11-05 MED ORDER — METRONIDAZOLE IN NACL 5-0.79 MG/ML-% IV SOLN
500.0000 mg | INTRAVENOUS | Status: AC
Start: 2013-11-05 — End: 2013-11-05
  Administered 2013-11-05: 500 mg via INTRAVENOUS
  Filled 2013-11-05: qty 100

## 2013-11-05 MED ORDER — MIDAZOLAM HCL 2 MG/2ML IJ SOLN
INTRAMUSCULAR | Status: AC
  Filled 2013-11-05: qty 2

## 2013-11-05 MED ORDER — GLYCOPYRROLATE 0.2 MG/ML IJ SOLN
INTRAMUSCULAR | Status: AC
  Filled 2013-11-05: qty 1

## 2013-11-05 MED ORDER — GLYCOPYRROLATE 0.2 MG/ML IJ SOLN
0.2000 mg | Freq: Once | INTRAMUSCULAR | Status: AC
  Administered 2013-11-05: 0.2 mg via INTRAVENOUS

## 2013-11-05 MED ORDER — KETOROLAC TROMETHAMINE 30 MG/ML IJ SOLN
30.0000 mg | Freq: Once | INTRAMUSCULAR | Status: AC
  Administered 2013-11-05: 30 mg via INTRAVENOUS

## 2013-11-05 MED ORDER — LIDOCAINE HCL (CARDIAC) 10 MG/ML IV SOLN
INTRAVENOUS | Status: DC | PRN
Start: 2013-11-05 — End: 2013-11-05
  Administered 2013-11-05: 20 mg via INTRAVENOUS

## 2013-11-05 MED ORDER — LIDOCAINE VISCOUS 2 % MT SOLN
OROMUCOSAL | Status: AC
  Filled 2013-11-05: qty 15

## 2013-11-05 MED ORDER — BUPIVACAINE HCL (PF) 0.5 % IJ SOLN
INTRAMUSCULAR | Status: AC
  Filled 2013-11-05: qty 30

## 2013-11-05 MED ORDER — FENTANYL CITRATE 0.05 MG/ML IJ SOLN
25.0000 ug | INTRAMUSCULAR | Status: AC
  Administered 2013-11-05 (×2): 25 ug via INTRAVENOUS

## 2013-11-05 SURGICAL SUPPLY — 30 items
BAG HAMPER (MISCELLANEOUS) ×2 IMPLANT
CLOTH BEACON ORANGE TIMEOUT ST (SAFETY) ×2 IMPLANT
COVER LIGHT HANDLE STERIS (MISCELLANEOUS) ×4 IMPLANT
COVER MAYO STAND XLG (DRAPE) ×2 IMPLANT
DECANTER SPIKE VIAL GLASS SM (MISCELLANEOUS) ×2 IMPLANT
DRAPE PROXIMA HALF (DRAPES) ×2 IMPLANT
ELECT REM PT RETURN 9FT ADLT (ELECTROSURGICAL) ×2
ELECTRODE REM PT RTRN 9FT ADLT (ELECTROSURGICAL) ×1 IMPLANT
FORMALIN 10 PREFIL 120ML (MISCELLANEOUS) ×2 IMPLANT
GAUZE SPONGE 4X4 12PLY STRL (GAUZE/BANDAGES/DRESSINGS) ×2 IMPLANT
GLOVE BIOGEL PI IND STRL 7.0 (GLOVE) ×2 IMPLANT
GLOVE BIOGEL PI INDICATOR 7.0 (GLOVE) ×2
GLOVE ECLIPSE 6.5 STRL STRAW (GLOVE) ×2 IMPLANT
GLOVE SURG SS PI 7.5 STRL IVOR (GLOVE) ×4 IMPLANT
GOWN STRL REUS W/TWL LRG LVL3 (GOWN DISPOSABLE) ×2 IMPLANT
GOWN STRL REUS W/TWL XL LVL3 (GOWN DISPOSABLE) ×2 IMPLANT
HEMOSTAT SURGICEL 4X8 (HEMOSTASIS) ×2 IMPLANT
KIT ROOM TURNOVER AP CYSTO (KITS) ×2 IMPLANT
LIGASURE IMPACT 36 18CM CVD LR (INSTRUMENTS) ×2 IMPLANT
MANIFOLD NEPTUNE II (INSTRUMENTS) ×2 IMPLANT
NEEDLE HYPO 25X1 1.5 SAFETY (NEEDLE) ×2 IMPLANT
NS IRRIG 1000ML POUR BTL (IV SOLUTION) ×2 IMPLANT
PACK PERI GYN (CUSTOM PROCEDURE TRAY) ×2 IMPLANT
PAD ARMBOARD 7.5X6 YLW CONV (MISCELLANEOUS) ×2 IMPLANT
SET BASIN LINEN APH (SET/KITS/TRAYS/PACK) ×2 IMPLANT
SPONGE GAUZE 4X4 12PLY (GAUZE/BANDAGES/DRESSINGS) ×2 IMPLANT
SURGILUBE 3G PEEL PACK STRL (MISCELLANEOUS) ×2 IMPLANT
SUT SILK 0 FSL (SUTURE) ×2 IMPLANT
SUT VIC AB 2-0 CT2 27 (SUTURE) IMPLANT
SYR CONTROL 10ML LL (SYRINGE) ×2 IMPLANT

## 2013-11-05 NOTE — Discharge Instructions (Signed)
Hemorrhoidectomy °Care After °Hemorrhoidectomy is the removal of enlarged (dilated) veins around the rectum. Until the surgical areas are healed, control of pain and avoiding constipation are the greatest challenges for patients.  °For as long as 24 hours after receiving an anesthetic (the medication that made you sleep), and while taking narcotic pain relievers, you may feel dizzy, weak and drowsy. For that reason, the following information applies to the first 24-hour period following surgery, and continues for as long as you are taking narcotic pain medications. °· Do not drive a car, ride a bicycle, participate in activities in which you could be hurt. Do not take public transportation until you are off narcotic pain medications and until your caregiver says it is okay. °· Do not drink alcohol, take tranquilizers, or medications not prescribed or allowed by your surgical caregiver. °· Do not sign important papers or contracts for at least 24 hours or while taking narcotic medications. °· Have a responsible person with you for 24 hours. °RISKS AND COMPLICATIONS °Some problems that may occur following this procedure include: °· Infection. A germ starts growing in the tissue surrounding the site operated on. This can usually be treated with antibiotics. °· Damage to the rectal sphincter could occur. This is the muscle that opens in your anus to allow a bowel movement. This could cause incontinence. This is uncommon. °· Bleeding following surgery can be a complication of almost any surgery. Your surgeon takes every precaution to keep this from happening. °· Complications of anesthesia. °HOME CARE INSTRUCTIONS °· Avoid straining when having bowel movements. °· Avoid heavy lifting (more than 10 pounds (4.5 kilograms)). °· Only take over-the-counter or prescription medicines for pain, discomfort, or fever as directed by your caregiver. °· Take hot sitz baths for 20 to 30 minutes, 3 to 4 times per day. °· To keep  swelling down, apply an ice pack for twenty minutes three to four times per day between sitz baths. Use a towel between your skin and the ice pack. Do not do this if it causes too much discomfort. °· Keep anal area clean and dry. Following a bowel movement, you can gently wash the area with tucks (available for purchase at a drugstore) or cotton swabs. Gently pat the area dry. Do not rub the area. °· Eat a well balanced diet and drink 6 to 8 glasses of water every day to avoid constipation. A bulk laxative may be also be helpful. °SEEK MEDICAL CARE IF:  °· You have increasing pain or tenderness near or in the surgical site. °· You are unable to eat or drink. °· You develop nausea or vomiting. °· You develop uncontrolled bleeding such as soaking two to three pads in one hour. °· You have constipation, not helped by changing your diet or increasing your fluid intake. Pain medications are a common cause of constipation. °· You have pain and redness (inflammation) extending outside the area of your surgery. °· You develop an unexplained oral temperature above 102° F (38.9° C), or any other signs of infection. °· You have any other questions or concerns following surgery. °Document Released: 08/17/2003 Document Revised: 08/19/2011 Document Reviewed: 11/14/2008 °[employer]® Patient Information ©2014 [employer]. ° °

## 2013-11-05 NOTE — Interval H&P Note (Signed)
History and Physical Interval Note:  11/05/2013 9:23 AM  Danielle Morris  has presented today for surgery, with the diagnosis of internal and external hemorrhoids  The various methods of treatment have been discussed with the patient and family. After consideration of risks, benefits and other options for treatment, the patient has consented to  Procedure(s): EXTENSIVE HEMORRHOIDECTOMY  (N/A) as a surgical intervention .  The patient's history has been reviewed, patient examined, no change in status, stable for surgery.  I have reviewed the patient's chart and labs.  Questions were answered to the patient's satisfaction.     Jamesetta So

## 2013-11-05 NOTE — Op Note (Signed)
Patient:  Danielle Morris  DOB:  03-05-1956  MRN:  [id number]   Preop Diagnosis:  Hemorrhoidal disease  Postop Diagnosis:  Same, internal and external  Procedure:  Extensive hemorrhoidectomy  Surgeon:  Aviva Signs, M.D.  Anes:  General  Indications:  Patient is a 58 year old white female presents with significant rectal pain and history of prolapsing internal hemorrhoids. She also has external hemorrhoids. Patient now comes the operating room for an extensive hemorrhoidectomy. The risks and benefits of the procedure including bleeding, infection, and a possibly recurrence of the hemorrhoids were fully explained to the patient, who gave informed consent.  Procedure note:  The patient was placed in lithotomy position after general anesthesia was administered. The perineum was prepped and draped using usual sterile technique with Betadine. Surgical site confirmation was performed.  On rectal examination, the patient had prominent external hemorrhoids from the 11:00 to 1:00 positions. She also had prolapsing internal hemorrhoid and external at the 4:00 position as well as external hemorrhoids at the 12:00 position. The external hemorrhoids were excised using the LigaSure. The internal and external hemorrhoids at the 4:00 position were also excised using the LigaSure. Care was taken to avoid the external sphincter mechanism. The patient did have a lax sphincter. 0.5% Sensorcaine was instilled the surrounding peritoneum. Surgicel and Viscous Xylocaine rectal packing was then placed.  All tape and needle counts were correct at the end of the procedure. The patient was awakened and transferred to PACU in stable condition.  Complications:  None  EBL:  Minimal  Specimen:  Hemorrhoids

## 2013-11-05 NOTE — Anesthesia Postprocedure Evaluation (Signed)
  Anesthesia Post-op Note  Patient: Danielle Morris  Procedure(s) Performed: Procedure(s): EXTENSIVE HEMORRHOIDECTOMY  (N/A)  Patient Location: PACU  Anesthesia Type:General  Level of Consciousness: awake and patient cooperative  Airway and Oxygen Therapy: Patient Spontanous Breathing and Patient connected to face mask oxygen  Post-op Pain: 3 /10, mild  Post-op Assessment: Post-op Vital signs reviewed, Patient's Cardiovascular Status Stable, Respiratory Function Stable and Pain level controlled  Post-op Vital Signs: Reviewed and stable  Last Vitals:  Filed Vitals:   11/05/13 0945  BP: 119/78  Pulse:   Temp:   Resp: 48    Complications: No apparent anesthesia complications

## 2013-11-05 NOTE — Transfer of Care (Signed)
Immediate Anesthesia Transfer of Care Note  Patient: Danielle Morris  Procedure(s) Performed: Procedure(s): EXTENSIVE HEMORRHOIDECTOMY  (N/A)  Patient Location: PACU  Anesthesia Type:General  Level of Consciousness: awake and patient cooperative  Airway & Oxygen Therapy: Patient Spontanous Breathing and Patient connected to face mask oxygen  Post-op Assessment: Report given to PACU RN, Post -op Vital signs reviewed and stable and Patient moving all extremities  Post vital signs: Reviewed and stable  Complications: No apparent anesthesia complications

## 2013-11-05 NOTE — Anesthesia Preprocedure Evaluation (Signed)
Anesthesia Evaluation  Patient identified by MRN, date of birth, ID band Patient awake    Reviewed: Allergy & Precautions, H&P , NPO status , Patient's Chart, lab work & pertinent test results  Airway Mallampati: II TM Distance: >3 FB Neck ROM: Full    Dental  (+) Teeth Intact   Pulmonary Current Smoker,  breath sounds clear to auscultation        Cardiovascular hypertension, Pt. on medications Rhythm:Regular Rate:Normal     Neuro/Psych PSYCHIATRIC DISORDERS Anxiety    GI/Hepatic negative GI ROS,   Endo/Other    Renal/GU      Musculoskeletal   Abdominal   Peds  Hematology   Anesthesia Other Findings   Reproductive/Obstetrics                           Anesthesia Physical Anesthesia Plan  ASA: II  Anesthesia Plan: General   Post-op Pain Management:    Induction: Intravenous  Airway Management Planned: LMA  Additional Equipment:   Intra-op Plan:   Post-operative Plan: Extubation in OR  Informed Consent: I have reviewed the patients History and Physical, chart, labs and discussed the procedure including the risks, benefits and alternatives for the proposed anesthesia with the patient or authorized representative who has indicated his/her understanding and acceptance.     Plan Discussed with:   Anesthesia Plan Comments:         Anesthesia Quick Evaluation

## 2013-11-06 ENCOUNTER — Encounter (HOSPITAL_COMMUNITY): Payer: Self-pay | Admitting: Emergency Medicine

## 2013-11-06 ENCOUNTER — Emergency Department (HOSPITAL_COMMUNITY): Payer: BC Managed Care – PPO

## 2013-11-06 ENCOUNTER — Emergency Department (HOSPITAL_COMMUNITY)
Admission: EM | Admit: 2013-11-06 | Discharge: 2013-11-06 | Disposition: A | Discharge status: Home / Self Care | Payer: BC Managed Care – PPO | Attending: Emergency Medicine | Admitting: Emergency Medicine

## 2013-11-06 DIAGNOSIS — I10 Essential (primary) hypertension: Secondary | ICD-10-CM | POA: Insufficient documentation

## 2013-11-06 DIAGNOSIS — K6289 Other specified diseases of anus and rectum: Secondary | ICD-10-CM | POA: Insufficient documentation

## 2013-11-06 DIAGNOSIS — F410 Panic disorder [episodic paroxysmal anxiety] without agoraphobia: Secondary | ICD-10-CM | POA: Insufficient documentation

## 2013-11-06 DIAGNOSIS — Z79899 Other long term (current) drug therapy: Secondary | ICD-10-CM | POA: Insufficient documentation

## 2013-11-06 DIAGNOSIS — F17200 Nicotine dependence, unspecified, uncomplicated: Secondary | ICD-10-CM | POA: Insufficient documentation

## 2013-11-06 LAB — CBC WITH DIFFERENTIAL/PLATELET
BASOS PCT: 0 % (ref 0–1)
Basophils Absolute: 0 10*3/uL (ref 0.0–0.1)
EOS ABS: 0.1 10*3/uL (ref 0.0–0.7)
Eosinophils Relative: 2 % (ref 0–5)
HCT: 41.5 % (ref 36.0–46.0)
HEMOGLOBIN: 14.1 g/dL (ref 12.0–15.0)
Lymphocytes Relative: 17 % (ref 12–46)
Lymphs Abs: 1.1 10*3/uL (ref 0.7–4.0)
MCH: 30.5 pg (ref 26.0–34.0)
MCHC: 34 g/dL (ref 30.0–36.0)
MCV: 89.8 fL (ref 78.0–100.0)
MONO ABS: 0.3 10*3/uL (ref 0.1–1.0)
Monocytes Relative: 5 % (ref 3–12)
Neutro Abs: 4.8 10*3/uL (ref 1.7–7.7)
Neutrophils Relative %: 76 % (ref 43–77)
Platelets: 227 10*3/uL (ref 150–400)
RBC: 4.62 MIL/uL (ref 3.87–5.11)
RDW: 13.5 % (ref 11.5–15.5)
WBC: 6.3 10*3/uL (ref 4.0–10.5)

## 2013-11-06 LAB — BASIC METABOLIC PANEL
BUN: 3 mg/dL — ABNORMAL LOW (ref 6–23)
CALCIUM: 9.5 mg/dL (ref 8.4–10.5)
CO2: 27 mEq/L (ref 19–32)
CREATININE: 0.67 mg/dL (ref 0.50–1.10)
Chloride: 100 mEq/L (ref 96–112)
Glucose, Bld: 112 mg/dL — ABNORMAL HIGH (ref 70–99)
Potassium: 3.9 mEq/L (ref 3.7–5.3)
Sodium: 139 mEq/L (ref 137–147)

## 2013-11-06 MED ORDER — KETOROLAC TROMETHAMINE 30 MG/ML IJ SOLN
30.0000 mg | Freq: Once | INTRAMUSCULAR | Status: AC
  Administered 2013-11-06: 30 mg via INTRAVENOUS
  Filled 2013-11-06: qty 1

## 2013-11-06 MED ORDER — ONDANSETRON HCL 4 MG/2ML IJ SOLN
4.0000 mg | Freq: Once | INTRAMUSCULAR | Status: AC
  Administered 2013-11-06: 4 mg via INTRAVENOUS
  Filled 2013-11-06: qty 2

## 2013-11-06 MED ORDER — PROMETHAZINE HCL 25 MG PO TABS: 25.0000 mg | ORAL_TABLET | Freq: Four times a day (QID) | ORAL | Status: DC | PRN

## 2013-11-06 MED ORDER — LIDOCAINE HCL 2 % EX GEL: 1.0000 "application " | CUTANEOUS | Status: DC | PRN

## 2013-11-06 MED ORDER — MORPHINE SULFATE 4 MG/ML IJ SOLN
4.0000 mg | Freq: Once | INTRAMUSCULAR | Status: AC
  Administered 2013-11-06: 4 mg via INTRAVENOUS
  Filled 2013-11-06: qty 1

## 2013-11-06 MED ORDER — OXYCODONE-ACETAMINOPHEN 5-325 MG PO TABS: 2.0000 | ORAL_TABLET | ORAL | Status: DC | PRN

## 2013-11-06 MED ORDER — HYDROMORPHONE HCL PF 1 MG/ML IJ SOLN
1.0000 mg | Freq: Once | INTRAMUSCULAR | Status: AC
  Administered 2013-11-06: 1 mg via INTRAVENOUS
  Filled 2013-11-06: qty 1

## 2013-11-06 MED ORDER — SODIUM CHLORIDE 0.9 % IV BOLUS (SEPSIS)
1000.0000 mL | Freq: Once | INTRAVENOUS | Status: AC
  Administered 2013-11-06: 1000 mL via INTRAVENOUS

## 2013-11-06 NOTE — ED Notes (Signed)
Pt states that she has not eaten anything since being released after her surgery yesterday, states she is in excessive pain in rectum and also has dental pain. The pt denies bleeding from site.

## 2013-11-06 NOTE — ED Notes (Signed)
Pt alert & oriented x4, stable gait. Patient given discharge instructions, paperwork & prescription(s). Patient  instructed to stop at the registration desk to finish any additional paperwork. Patient verbalized understanding. Pt left department w/ no further questions. 

## 2013-11-06 NOTE — ED Provider Notes (Signed)
CSN: [id number]     Arrival date & time 11/06/13  0658 History  This chart was scribed for Nat Christen, MD by Delphia Grates, ED Scribe. This patient was seen in room [facility] and the patient's care was started at 7:38 AM.    Chief Complaint  Patient presents with  . Rectal Pain     The history is provided by the patient. No language interpreter was used.   HPI Comments: Danielle Morris is a 58 y.o. female who presents to the Emergency Department complaining of rectal pain that began yesterday. Patient states she had a hemorrhoidectomy, performed by Dr. Arnoldo Morale yesterday at 11 AM, and has been in pain since the procedure. She states she received a colonoscopy (performed by D.r. Field) 4 days ago and had 8 polyps removed. Patient states she has taken Vicodin with no relief. Patient has history of HTN. No fever, chills, nausea, vomiting, diarrhea. Severity is moderate.   Past Medical History  Diagnosis Date  . Hypertension   . Panic attack    Past Surgical History  Procedure Laterality Date  . Tonsillectomy      age  69  . Colonoscopy with propofol N/A 11/02/2013    Procedure: COLONOSCOPY WITH PROPOFOL;  Surgeon: Danie Binder, MD;  Location: [facility];  Service: Endoscopy;  Laterality: N/A;  in cecum @ 1002; cecal withdrawal time- minutes  . Hemorrhoid banding N/A 11/02/2013    Procedure: HEMORRHOID BANDING;  Surgeon: Danie Binder, MD;  Location: [facility];  Service: Endoscopy;  Laterality: N/A;  . Polypectomy N/A 11/02/2013    Procedure: POLYPECTOMY;  Surgeon: Danie Binder, MD;  Location: [facility];  Service: Endoscopy;  Laterality: N/A;  sigmoid colon, hepatic flexure, hyperplastic rectal, and rectal polyps  . Hemorrhoid surgery N/A 11/05/2013    Procedure: EXTENSIVE HEMORRHOIDECTOMY ;  Surgeon: Jamesetta So, MD;  Location: [facility];  Service: General;  Laterality: N/A;   Family History  Problem Relation Age of Onset  . Heart disease Father   . Diabetes Other    History  Substance  Use Topics  . Smoking status: Current Some Day Smoker -- 0.50 packs/day for 10 years    Types: Cigarettes  . Smokeless tobacco: Never Used     Comment: uses E-cigs  . Alcohol Use: No   OB History   Grav Para Term Preterm Abortions TAB SAB Ect Mult Living   _0 Review of Systems A complete 10 system review of systems was obtained and all systems are negative except as noted in the HPI and PMH.     Allergies  Amlodipine; Tramadol; Hydrocodone; Lisinopril; and Chantix  Home Medications   Prior to Admission medications   Medication Sig Start Date End Date Taking? Authorizing Provider  docusate sodium (STOOL SOFTENER) 100 MG capsule Take 100 mg by mouth 2 (two) times daily.    Yes Historical Provider, MD  doxylamine, Sleep, (UNISOM) 25 MG tablet Take 25 mg by mouth at bedtime as needed for sleep.    Yes Historical Provider, MD  HYDROcodone-acetaminophen (NORCO/VICODIN) 5-325 MG per tablet Take 1 tablet by mouth every 4 (four) hours as needed. 10/23/13  Yes Ezequiel Essex, MD  Lidocaine-Hydrocortisone Ace 3-2.5 % KIT Place 1 Applicatorful rectally 2 (two) times daily. 10/27/13  Yes Mahala Menghini, PA-C  Linaclotide (LINZESS) 145 MCG CAPS capsule Take 1 capsule (145 mcg total) by mouth daily. 10/27/13  Yes Laureen Ochs  Lewis, PA-C  LORazepam (ATIVAN) 1 MG tablet Take 1 tablet (1 mg total) by mouth 2 (two) times daily as needed for anxiety. 10/14/13  Yes Mary B Dixon, PA-C  losartan (COZAAR) 50 MG tablet Take 50 mg by mouth daily.   Yes Historical Provider, MD  lidocaine (XYLOCAINE JELLY) 2 % jelly Apply 1 application topically as needed. Applied to rectum 3 times a day for pain 11/06/13   Nat Christen, MD  oxyCODONE-acetaminophen (PERCOCET) 5-325 MG per tablet Take 2 tablets by mouth every 4 (four) hours as needed. 11/06/13   Nat Christen, MD  promethazine (PHENERGAN) 25 MG tablet Take 1 tablet (25 mg total) by mouth every 6 (six) hours as needed. 11/06/13   Nat Christen, MD   Triage Vitals:  BP 153/105  Pulse 77  Temp(Src) 98.1 F (36.7 C) (Oral)  Resp 18  Ht _0  (1.702 m)  Wt 150 lb (68.04 kg)  BMI 23.49 kg/m2  SpO2 98%  Physical Exam  Nursing note and vitals reviewed. Constitutional: She is oriented to person, place, and time. She appears well-developed and well-nourished.  HENT:  Head: Normocephalic and atraumatic.  Eyes: Conjunctivae and EOM are normal. Pupils are equal, round, and reactive to light.  Neck: Normal range of motion. Neck supple.  Cardiovascular: Normal rate, regular rhythm and normal heart sounds.   Pulmonary/Chest: Effort normal and breath sounds normal.  Abdominal: Soft. Bowel sounds are normal.  Genitourinary:  Residual external hemorrhoids. No bleeding. Slightly inflamed. No obvious infection or cellulitis.  Musculoskeletal: Normal range of motion.  Neurological: She is alert and oriented to person, place, and time.  Skin: Skin is warm and dry.  Psychiatric: She has a normal mood and affect. Her behavior is normal.    ED Course  Procedures (including critical care time)  DIAGNOSTIC STUDIES: Oxygen Saturation is 98% on room air, normal by my interpretation.    COORDINATION OF CARE: At [phone] Discussed treatment plan with patient which includes imaging, labs, and IV fluid. Patient agrees.   Labs Review Labs Reviewed  BASIC METABOLIC PANEL - Abnormal; Notable for the following:    Glucose, Bld 112 (*)    BUN 3 (*)    All other components within normal limits  CBC WITH DIFFERENTIAL    Imaging Review Dg Abd Acute W/chest  11/06/2013   CLINICAL DATA:  Lower abdominal pain.  EXAM: ACUTE ABDOMEN SERIES (ABDOMEN 2 VIEW & CHEST 1 VIEW)  COMPARISON:  None  FINDINGS: There is no evidence of dilated bowel loops or free intraperitoneal air. No radiopaque calculi or other significant radiographic abnormality is seen. There is a linear radiodensity in the left hemi abdomen measuring 1.9 cm. This likely represents clip from polyp removal. Heart size  and mediastinal contours are within normal limits. Both lungs are clear.  IMPRESSION: 1. Nonobstructive bowel gas pattern. 2. No active cardiopulmonary abnormalities   Electronically Signed   By: Kerby Moors M.D.   On: 11/06/2013 08:23     EKG Interpretation None      MDM   Final diagnoses:  Rectal pain    No acute abdomen. Rectum examined which appears to have normal postoperative appearance.  Discharge medications Xylocaine gel, Percocet, Phenergan 25 mg  I personally performed the services described in this documentation, which was scribed in my presence. The recorded information has been reviewed and is accurate.     Nat Christen, MD 11/06/13 1352

## 2013-11-06 NOTE — ED Notes (Signed)
Pt states that she had hemorrhoidectomy yesterday, has been in pain during the night, states that she only has a few pain tablets left and that they are not working, pt reports that she took one 5/325mg  vicodin at 6 this am with no improvement in symptoms

## 2013-11-06 NOTE — Discharge Instructions (Signed)
I discussed your situation with Dr. Arnoldo Morale.     He recommended sitting in a warm bathtub, numbing jelly, pain medication, nausea medication. Increase fluids. Followup in this office as needed

## 2013-11-07 ENCOUNTER — Emergency Department (HOSPITAL_COMMUNITY): Payer: BC Managed Care – PPO

## 2013-11-07 ENCOUNTER — Encounter (HOSPITAL_COMMUNITY): Payer: Self-pay | Admitting: Emergency Medicine

## 2013-11-07 ENCOUNTER — Emergency Department (HOSPITAL_COMMUNITY)
Admission: EM | Admit: 2013-11-07 | Discharge: 2013-11-07 | Disposition: A | Discharge status: Home / Self Care | Payer: BC Managed Care – PPO | Attending: Emergency Medicine | Admitting: Emergency Medicine

## 2013-11-07 DIAGNOSIS — K5900 Constipation, unspecified: Secondary | ICD-10-CM | POA: Insufficient documentation

## 2013-11-07 DIAGNOSIS — R209 Unspecified disturbances of skin sensation: Secondary | ICD-10-CM | POA: Insufficient documentation

## 2013-11-07 DIAGNOSIS — F419 Anxiety disorder, unspecified: Secondary | ICD-10-CM

## 2013-11-07 DIAGNOSIS — Z9889 Other specified postprocedural states: Secondary | ICD-10-CM | POA: Insufficient documentation

## 2013-11-07 DIAGNOSIS — F17200 Nicotine dependence, unspecified, uncomplicated: Secondary | ICD-10-CM | POA: Insufficient documentation

## 2013-11-07 DIAGNOSIS — I10 Essential (primary) hypertension: Secondary | ICD-10-CM | POA: Insufficient documentation

## 2013-11-07 DIAGNOSIS — Z79899 Other long term (current) drug therapy: Secondary | ICD-10-CM | POA: Insufficient documentation

## 2013-11-07 DIAGNOSIS — F410 Panic disorder [episodic paroxysmal anxiety] without agoraphobia: Secondary | ICD-10-CM | POA: Insufficient documentation

## 2013-11-07 LAB — CBC WITH DIFFERENTIAL/PLATELET
BASOS ABS: 0 10*3/uL (ref 0.0–0.1)
Basophils Relative: 0 % (ref 0–1)
EOS ABS: 0.1 10*3/uL (ref 0.0–0.7)
Eosinophils Relative: 1 % (ref 0–5)
HCT: 39.7 % (ref 36.0–46.0)
HEMOGLOBIN: 13.5 g/dL (ref 12.0–15.0)
Lymphocytes Relative: 17 % (ref 12–46)
Lymphs Abs: 1 10*3/uL (ref 0.7–4.0)
MCH: 30.5 pg (ref 26.0–34.0)
MCHC: 34 g/dL (ref 30.0–36.0)
MCV: 89.8 fL (ref 78.0–100.0)
MONOS PCT: 5 % (ref 3–12)
Monocytes Absolute: 0.3 10*3/uL (ref 0.1–1.0)
NEUTROS ABS: 4.4 10*3/uL (ref 1.7–7.7)
NEUTROS PCT: 77 % (ref 43–77)
PLATELETS: 215 10*3/uL (ref 150–400)
RBC: 4.42 MIL/uL (ref 3.87–5.11)
RDW: 13.4 % (ref 11.5–15.5)
WBC: 5.7 10*3/uL (ref 4.0–10.5)

## 2013-11-07 LAB — COMPREHENSIVE METABOLIC PANEL
ALT: 18 U/L (ref 0–35)
AST: 21 U/L (ref 0–37)
Albumin: 3.5 g/dL (ref 3.5–5.2)
Alkaline Phosphatase: 91 U/L (ref 39–117)
BUN: 3 mg/dL — AB (ref 6–23)
CO2: 28 mEq/L (ref 19–32)
Calcium: 9.4 mg/dL (ref 8.4–10.5)
Chloride: 104 mEq/L (ref 96–112)
Creatinine, Ser: 0.66 mg/dL (ref 0.50–1.10)
GFR calc Af Amer: >90 mL/min (ref >90)
GFR calc non Af Amer: >90 mL/min (ref >90)
Glucose, Bld: 116 mg/dL — ABNORMAL HIGH (ref 70–99)
POTASSIUM: 4.3 meq/L (ref 3.7–5.3)
Sodium: 142 mEq/L (ref 137–147)
TOTAL PROTEIN: 7.4 g/dL (ref 6.0–8.3)
Total Bilirubin: 0.3 mg/dL (ref 0.3–1.2)

## 2013-11-07 MED ORDER — HYDROMORPHONE HCL PF 1 MG/ML IJ SOLN
1.0000 mg | Freq: Once | INTRAMUSCULAR | Status: AC
  Administered 2013-11-07: 1 mg via INTRAVENOUS
  Filled 2013-11-07: qty 1

## 2013-11-07 MED ORDER — ONDANSETRON HCL 4 MG/2ML IJ SOLN
4.0000 mg | Freq: Once | INTRAMUSCULAR | Status: AC
  Administered 2013-11-07: 4 mg via INTRAVENOUS
  Filled 2013-11-07: qty 2

## 2013-11-07 NOTE — ED Notes (Deleted)
Pt alert & oriented x4, stable gait. Patient given discharge instructions, paperwork & prescription(s). Patient  instructed to stop at the registration desk to finish any additional paperwork. Patient verbalized understanding. Pt left department w/ no further questions. 

## 2013-11-07 NOTE — ED Notes (Signed)
Discharge instructions reviewed with pt, questions answered. Pt verbalized understanding.  

## 2013-11-07 NOTE — ED Notes (Signed)
Pt reports took 2 percocet together today around 1230 and approx 45 min later had numbness that started in left calf and moved up left side of body.  Pt says at this time just feels "tingly."  Reports has not taken 2 pain pills together before.  Notified Dr. Lacinda Axon.

## 2013-11-07 NOTE — Discharge Instructions (Signed)
Follow up as planned with your md this week.  Drink 1/2 of a bottle of mg citrate for the drug store.  You should have a bowel movement after that

## 2013-11-07 NOTE — ED Provider Notes (Signed)
CSN: [id number]     Arrival date & time 11/07/13  1354 History  This chart was scribed for Maudry Diego, MD by Zettie Pho, ED Scribe. This patient was seen in room [facility] and the patient's care was started at 2:29 PM.    Chief Complaint  Patient presents with  . Rectal Pain   The history is provided by the patient. No language interpreter was used.   HPI Comments: Danielle Morris is a 58 y.o. female who presents to the Emergency Department complaining of a constant rectal pain onset 4 days ago after patient had and a polypectomy (performed by Dr. Barney Drain) on 11/02/2013 and an extensive hemorrhoidectomy (performed by Dr. Aviva Signs) on 11/05/2013. Patient was seen here yesterday for similar complaints and received a CT of the abdomen with normal post-operative healing and no acute findings. Patient was discharged with lidocaine gel, Percocet, and Phenergan. Patient reports that she took the Percocet around 2.5 hours ago, but then developed some numbness to the left arm and leg. She states that the numbness has been gradually improving. She reports a burning sensation to the epigastric region of the abdomen that presents with eating and some constipation over the last week. Patient has allergies to hydrocodone, tramadol, amlodipine, lisinopril, and Chantix. Patient has a history of HTN.   Past Medical History  Diagnosis Date  . Hypertension   . Panic attack    Past Surgical History  Procedure Laterality Date  . Tonsillectomy      age  74  . Colonoscopy with propofol N/A 11/02/2013    Procedure: COLONOSCOPY WITH PROPOFOL;  Surgeon: Danie Binder, MD;  Location: [facility];  Service: Endoscopy;  Laterality: N/A;  in cecum @ 1002; cecal withdrawal time- minutes  . Hemorrhoid banding N/A 11/02/2013    Procedure: HEMORRHOID BANDING;  Surgeon: Danie Binder, MD;  Location: [facility];  Service: Endoscopy;  Laterality: N/A;  . Polypectomy N/A 11/02/2013    Procedure: POLYPECTOMY;  Surgeon: Danie Binder, MD;  Location: [facility];  Service: Endoscopy;  Laterality: N/A;  sigmoid colon, hepatic flexure, hyperplastic rectal, and rectal polyps  . Hemorrhoid surgery N/A 11/05/2013    Procedure: EXTENSIVE HEMORRHOIDECTOMY ;  Surgeon: Jamesetta So, MD;  Location: [facility];  Service: General;  Laterality: N/A;   Family History  Problem Relation Age of Onset  . Heart disease Father   . Diabetes Other    History  Substance Use Topics  . Smoking status: Current Some Day Smoker -- 0.50 packs/day for 10 years    Types: Cigarettes  . Smokeless tobacco: Never Used     Comment: uses E-cigs  . Alcohol Use: No   OB History   Grav Para Term Preterm Abortions TAB SAB Ect Mult Living   '3 3 3       3     '$ Review of Systems  Constitutional: Negative for appetite change and fatigue.  HENT: Negative for congestion, ear discharge and sinus pressure.   Eyes: Negative for discharge.  Respiratory: Negative for cough.   Cardiovascular: Negative for chest pain.  Gastrointestinal: Positive for abdominal pain, constipation and rectal pain. Negative for diarrhea.  Genitourinary: Negative for frequency and hematuria.  Musculoskeletal: Negative for back pain.  Skin: Negative for rash.  Neurological: Positive for numbness. Negative for seizures and headaches.  Psychiatric/Behavioral: Negative for hallucinations.      Allergies  Amlodipine; Tramadol; Hydrocodone; Lisinopril; and Chantix  Home Medications   Prior to Admission medications  Medication Sig Start Date End Date Taking? Authorizing Provider  docusate sodium (STOOL SOFTENER) 100 MG capsule Take 100 mg by mouth 2 (two) times daily.     Historical Provider, MD  doxylamine, Sleep, (UNISOM) 25 MG tablet Take 25 mg by mouth at bedtime as needed for sleep.     Historical Provider, MD  HYDROcodone-acetaminophen (NORCO/VICODIN) 5-325 MG per tablet Take 1 tablet by mouth every 4 (four) hours as needed. 10/23/13   Ezequiel Essex, MD  lidocaine  (XYLOCAINE JELLY) 2 % jelly Apply 1 application topically as needed. Applied to rectum 3 times a day for pain 11/06/13   Nat Christen, MD  Lidocaine-Hydrocortisone Ace 3-2.5 % KIT Place 1 Applicatorful rectally 2 (two) times daily. 10/27/13   Mahala Menghini, PA-C  Linaclotide [facility]) 145 MCG CAPS capsule Take 1 capsule (145 mcg total) by mouth daily. 10/27/13   Mahala Menghini, PA-C  LORazepam (ATIVAN) 1 MG tablet Take 1 tablet (1 mg total) by mouth 2 (two) times daily as needed for anxiety. 10/14/13   Lonie Peak Dixon, PA-C  losartan (COZAAR) 50 MG tablet Take 50 mg by mouth daily.    Historical Provider, MD  oxyCODONE-acetaminophen (PERCOCET) 5-325 MG per tablet Take 2 tablets by mouth every 4 (four) hours as needed. 11/06/13   Nat Christen, MD  promethazine (PHENERGAN) 25 MG tablet Take 1 tablet (25 mg total) by mouth every 6 (six) hours as needed. 11/06/13   Nat Christen, MD   Triage Vitals: BP 152/91  Pulse 72  Temp(Src) 98.5 F (36.9 C) (Oral)  Resp 20  SpO2 100%  Physical Exam  Constitutional: She is oriented to person, place, and time. She appears well-developed.  Tearful.   HENT:  Head: Normocephalic.  Eyes: Conjunctivae and EOM are normal. No scleral icterus.  Neck: Neck supple. No thyromegaly present.  Cardiovascular: Normal rate and regular rhythm.  Exam reveals no gallop and no friction rub.   No murmur heard. Pulmonary/Chest: No stridor. She has no wheezes. She has no rales. She exhibits no tenderness.  Abdominal: She exhibits no distension. There is no tenderness. There is no rebound.  Musculoskeletal: Normal range of motion. She exhibits no edema.  Lymphadenopathy:    She has no cervical adenopathy.  Neurological: She is oriented to person, place, and time. She exhibits normal muscle tone. Coordination normal.  Normal strength and sensation in bilateral upper and lower extremities.   Skin: No rash noted. No erythema.  Psychiatric: She has a normal mood and affect. Her behavior is  normal.    ED Course  Procedures (including critical care time)  DIAGNOSTIC STUDIES: Oxygen Saturation is 100% on room air, normal by my interpretation.    COORDINATION OF CARE: 2:32 PM- Will order a head CT, CBC, and CMP. Discussed treatment plan with patient at bedside and patient verbalized agreement.     Labs Review Labs Reviewed - No data to display  Imaging Review Dg Abd Acute W/chest  11/06/2013   CLINICAL DATA:  Lower abdominal pain.  EXAM: ACUTE ABDOMEN SERIES (ABDOMEN 2 VIEW & CHEST 1 VIEW)  COMPARISON:  None  FINDINGS: There is no evidence of dilated bowel loops or free intraperitoneal air. No radiopaque calculi or other significant radiographic abnormality is seen. There is a linear radiodensity in the left hemi abdomen measuring 1.9 cm. This likely represents clip from polyp removal. Heart size and mediastinal contours are within normal limits. Both lungs are clear.  IMPRESSION: 1. Nonobstructive bowel gas pattern. 2. No active  cardiopulmonary abnormalities   Electronically Signed   By: Kerby Moors M.D.   On: 11/06/2013 08:23     EKG Interpretation None      MDM   Final diagnoses:  None    Anxiety and constipation The chart was scribed for me under my direct supervision.  I personally performed the history, physical, and medical decision making and all procedures in the evaluation of this patient.Maudry Diego, MD 11/07/13 [phone]

## 2013-11-07 NOTE — ED Notes (Signed)
Per ems pt was seen in ER yesterday for same complaint, co rectal pain since surgery on Thursday. Pt states pain is not under control.

## 2013-11-10 ENCOUNTER — Telehealth: Payer: Self-pay | Admitting: *Deleted

## 2013-11-10 DIAGNOSIS — F411 Generalized anxiety disorder: Secondary | ICD-10-CM

## 2013-11-10 DIAGNOSIS — F410 Panic disorder [episodic paroxysmal anxiety] without agoraphobia: Secondary | ICD-10-CM

## 2013-11-10 MED ORDER — LORAZEPAM 1 MG PO TABS: 1.0000 mg | ORAL_TABLET | Freq: Two times a day (BID) | ORAL | Status: DC | PRN

## 2013-11-10 NOTE — Telephone Encounter (Signed)
Medication called to pharmacy.  Call placed to patient and patient made aware.  

## 2013-11-10 NOTE — Telephone Encounter (Signed)
PT is aware that Dr. Oneida Alar has been out of the office/hospital a few days but we will try to get these results to her soon.

## 2013-11-10 NOTE — Telephone Encounter (Signed)
Pt's husband called to get the results for pt she had a procedure last tuesday with polyps removed. Please advise [phone]

## 2013-11-10 NOTE — Telephone Encounter (Signed)
Give 1 refill, she needs a f/u visit with Olean Ree for her anxiety since she is requiring this medication on regular basis

## 2013-11-10 NOTE — Telephone Encounter (Signed)
Ok to refill??  Last office visit 10/21/2013.   Last refill 10/14/2013.

## 2013-11-17 NOTE — Telephone Encounter (Addendum)
Please call pt. She had ONE advanced adenomas AND TWO SIMPLE ADENOMAS removed. She needs a TCS in 3 years.     NO MRI UNTIL AFTER JUN 27.  AVOID CONSTIPATION.  FOLLOW A HIGH FIBER DIET. SEE INFO BELOW.  DRINK WATER TO KEEP YOUR URINE LIGHT YELLOW.  CONTINUE LINZESS TO PREVENT CONSTIPATION AND COLACE TO SOFTEN STOOL.  OPV E30 AUG 2015 CONSTIPATION, RECTAL BLEEDING.  Next colonoscopy in 3 years. YOUR SISTERS, BROTHERS, CHILDREN, AND PARENTS NEED TO HAVE A COLONOSCOPY STARTING AT THE AGE OF 40.

## 2013-11-21 NOTE — Telephone Encounter (Signed)
Reminder in epic °

## 2013-11-22 NOTE — Telephone Encounter (Signed)
LM for pt to call

## 2013-11-30 NOTE — Telephone Encounter (Signed)
Called and informed pt.  

## 2013-12-06 ENCOUNTER — Telehealth: Payer: Self-pay | Admitting: Family Medicine

## 2013-12-06 ENCOUNTER — Other Ambulatory Visit: Payer: Self-pay | Admitting: Family Medicine

## 2013-12-06 ENCOUNTER — Other Ambulatory Visit: Payer: Self-pay | Admitting: Physician Assistant

## 2013-12-06 NOTE — Telephone Encounter (Signed)
Approved For #[id number] additional refills.

## 2013-12-06 NOTE — Telephone Encounter (Signed)
Just had multiple surgeries.  Has follow up Thursday.  Can we call in refill of Ativan?

## 2013-12-07 NOTE — Telephone Encounter (Signed)
Appointment with PA on 12/09/2013.

## 2013-12-07 NOTE — Telephone Encounter (Signed)
RX called in .

## 2013-12-07 NOTE — Telephone Encounter (Signed)
See phone note from patient

## 2013-12-09 ENCOUNTER — Encounter: Payer: Self-pay | Admitting: Physician Assistant

## 2013-12-09 ENCOUNTER — Ambulatory Visit (INDEPENDENT_AMBULATORY_CARE_PROVIDER_SITE_OTHER): Payer: BC Managed Care – HMO | Admitting: Physician Assistant

## 2013-12-09 VITALS — BP 118/78 | HR 88 | Temp 98.5°F | Resp 18 | Wt 146.0 lb

## 2013-12-09 DIAGNOSIS — F410 Panic disorder [episodic paroxysmal anxiety] without agoraphobia: Secondary | ICD-10-CM

## 2013-12-09 DIAGNOSIS — F411 Generalized anxiety disorder: Secondary | ICD-10-CM

## 2013-12-09 DIAGNOSIS — K6289 Other specified diseases of anus and rectum: Secondary | ICD-10-CM

## 2013-12-09 MED ORDER — HYDROMORPHONE HCL 2 MG PO TABS: 2.0000 mg | ORAL_TABLET | Freq: Four times a day (QID) | ORAL | Status: DC | PRN

## 2013-12-09 MED ORDER — LORAZEPAM 1 MG PO TABS: 1.0000 mg | ORAL_TABLET | Freq: Three times a day (TID) | ORAL | Status: DC | PRN

## 2013-12-09 NOTE — Progress Notes (Signed)
Patient ID: Danielle Morris MRN: [id number], DOB: July 22, 1955, 58 y.o. Date of Encounter: 12/09/2013, 4:42 PM    Chief Complaint:  Chief Complaint  Patient presents with  . med refills and discuss meds    s/p rectal surgery     HPI: 58 y.o. year old female recently had rectal surgery. Says that her last office visit with Dr. Arnoldo Morale was last week and he has planned for no further followup with him.  She is here to follow up regarding medications for pain and also for constipation. She needs to wean off of her pain medicines.  Regarding the hydromorphone (Dilaudid) she says that she had been taking every 4 hours. For 2 weeks now has been taking only one at a time (except 2 after a BM). Says that she has to take 2 pills after a BM. 6 hours later she will take one pill. Says that she has tried taking the 2 pills after the BM but then going without any further pain pills later once those 2 wear off. When she did this, about 6 hours after that dose (taken after BM) she started to feel really bad and felt that it was because of withdrawals from the pain medicine. Says she feels that if she had enough lorazepam to where she could take a lorazepam at that time when the hydromorphone is due, that the lorazepam would relax her and she could wean off the pain med.   Also says she is having problems with constipation. Says that she was told not to strain and to keep her stools soft but she is having difficulty with this. Says that the GI doctor had prescribed her Linzess but when she took this she would suddenly begin to have BM and could not even get to the bathroom in time. Says that it made her stools very loose and watery. Therefore she stopped the Linzess. Currently she is taking 3 docusate sodium stool softeners a day. She takes one in the morning and 2 at night. Says that she is having a BM daily but says that it is hard and she is having to strain.     Home Meds:   Outpatient Prescriptions  Prior to Visit  Medication Sig Dispense Refill  . docusate sodium (STOOL SOFTENER) 100 MG capsule Take 100 mg by mouth 2 (two) times daily. One with breakfast, two at bedtime      . doxylamine, Sleep, (UNISOM) 25 MG tablet Take 25 mg by mouth at bedtime as needed for sleep.       Marland Kitchen losartan (COZAAR) 50 MG tablet Take 50 mg by mouth daily.      Marland Kitchen LORazepam (ATIVAN) 1 MG tablet TAKE 1 TABLET BY MOUTH TWICE A DAY AS NEEDED  30 tablet  0  . Linaclotide (LINZESS) 145 MCG CAPS capsule Take 1 capsule (145 mcg total) by mouth daily.  30 capsule  11  . oxyCODONE-acetaminophen (PERCOCET) 5-325 MG per tablet Take 2 tablets by mouth every 4 (four) hours as needed.  20 tablet  0   No facility-administered medications prior to visit.    Allergies:  Allergies  Allergen Reactions  . Amlodipine Shortness Of Breath, Anxiety and Palpitations  . Tramadol     Rushes thru body, miserable, shaking and crying  . Hydrocodone Nausea And Vomiting  . Lisinopril Other (See Comments)    unknown  . Chantix [Varenicline] Nausea Only and Anxiety      Review of Systems: See HPI for pertinent  ROS. All other ROS negative.    Physical Exam: Blood pressure 118/78, pulse 88, temperature 98.5 F (36.9 C), temperature source Oral, resp. rate 18, weight 146 lb (66.225 kg)., Body mass index is 22.86 kg/(m^2). General: WNWD WF.  Appears in no acute distress. Neck: Supple. No thyromegaly. No lymphadenopathy. Lungs: Clear bilaterally to auscultation without wheezes, rales, or rhonchi. Breathing is unlabored. Heart: Regular rhythm. No murmurs, rubs, or gallops. Msk:  Strength and tone normal for age. Extremities/Skin: Warm and dry.  Neuro: Alert and oriented X 3. Moves all extremities spontaneously. Gait is normal. CNII-XII grossly in tact. Psych:  Responds to questions appropriately with a normal affect.     ASSESSMENT AND PLAN:  58 y.o. year old female with  1. Rectal pain - HYDROmorphone (DILAUDID) 2 MG tablet;  Take 1 tablet (2 mg total) by mouth every 6 (six) hours as needed for severe pain.  Dispense: 30 tablet; Refill: 0  2. Generalized anxiety disorder - LORazepam (ATIVAN) 1 MG tablet; Take 1 tablet (1 mg total) by mouth every 8 (eight) hours as needed for anxiety.  Dispense: 90 tablet; Refill: 0  3. Panic disorder - LORazepam (ATIVAN) 1 MG tablet; Take 1 tablet (1 mg total) by mouth every 8 (eight) hours as needed for anxiety.  Dispense: 90 tablet; Refill: 0  She is going to decrease the Dilaudid to taking just one at a time. She is going to take 1 at the time of her bowel movements. She is then going to try to go without any more through the rest of the day. I am increasing her Ativan to where she can take that 3 daily. She says that for her general anxiety she has to take In the morning and another one at night. Needs to have one additional dose per day to take when the hydromorphone seems to be wearing off.  Regarding the constipation, continue current dose of docusate sodium. Add MiraLax to this. Told her that she can adjust the amount of MiraLax depending on her stools.  Will Have her schedule a followup office visit in 2 weeks to reassess. F/U sooner if needed.  Marin Olp [place], [place], [facility] 12/09/2013 4:42 PM

## 2013-12-14 ENCOUNTER — Telehealth: Payer: Self-pay | Admitting: *Deleted

## 2013-12-14 NOTE — Telephone Encounter (Signed)
Message copied by Sheral Flow on Tue Dec 14, 2013 12:10 PM ------      Message from: Devoria Glassing      Created: Tue Dec 14, 2013 11:28 AM       Patient says she had an issue with filling her lorazepam       On Friday would like a phone call back       [phone]        ------

## 2013-12-14 NOTE — Telephone Encounter (Signed)
Call returned to patient. States that pharmacy will not fill her prescription for Ativan at this time.   Call placed to pharmacy. Was advised that patient picked up prescription for Ativan #30 with BID directions on 12/07/2013. States that if dosing increased to TID, patient could pick up new prescription on 12/16/2013.  Call placed to patient to make aware. Advised that she has enough medication to last 5 days, carrying her through 12/19/2013.

## 2013-12-27 ENCOUNTER — Encounter: Payer: Self-pay | Admitting: Physician Assistant

## 2013-12-27 ENCOUNTER — Ambulatory Visit (INDEPENDENT_AMBULATORY_CARE_PROVIDER_SITE_OTHER): Payer: BC Managed Care – HMO | Admitting: Physician Assistant

## 2013-12-27 VITALS — BP 116/68 | HR 80 | Temp 97.7°F | Resp 18 | Wt 148.0 lb

## 2013-12-27 DIAGNOSIS — F411 Generalized anxiety disorder: Secondary | ICD-10-CM

## 2013-12-27 DIAGNOSIS — N764 Abscess of vulva: Secondary | ICD-10-CM

## 2013-12-27 DIAGNOSIS — F410 Panic disorder [episodic paroxysmal anxiety] without agoraphobia: Secondary | ICD-10-CM

## 2013-12-27 DIAGNOSIS — K6289 Other specified diseases of anus and rectum: Secondary | ICD-10-CM

## 2013-12-27 MED ORDER — SULFAMETHOXAZOLE-TMP DS 800-160 MG PO TABS: 1.0000 | ORAL_TABLET | Freq: Two times a day (BID) | ORAL | Status: DC

## 2013-12-27 NOTE — Progress Notes (Signed)
Patient ID: Danielle Morris MRN: [id number], DOB: 1955/10/19, 58 y.o. Date of Encounter: 12/27/2013, 2:56 PM    Chief Complaint:  Chief Complaint  Patient presents with  . 2 week follow up    also had cyst/boil? drain from peri area  ?need antibiotic     HPI: 58 y.o. year old female here for f/u.   Her last office visit with me was 12/09/13. At that time she was following up after recent rectal surgery. At that [place] she told me that her last office visit with Dr. Arnoldo Morris had been the prior week and he had planned for no further followup with him.  She had OV with me 12/09/13 to follow up regarding medications for pain and also for constipation. She was needing to wean off of her pain medicines.  Regarding the hydromorphone (Dilaudid)--At that OV 12/09/13  she said that for the 2 weeks prior to that OV she had been taking only one at a time (except 2 after a BM). Said that she had to take 2 pills after a BM. 6 hours later she would take one pill. Said that she had tried taking the 2 pills after the BM but then going without any further pain pills later once those 2 wore off. When she did this, about 6 hours after that dose (taken after BM) she started to feel really bad and felt that it was because of withdrawals from the pain medicine. Said she felt that if she had enough lorazepam to where she could take a lorazepam at that time when the hydromorphone was due, that the lorazepam would relax her and she could wean off the pain med.   Also said she is having problems with constipation. Said that she was told not to strain and to keep her stools soft but she was having difficulty with this. Said that the GI doctor had prescribed her Linzess but when she took this she would suddenly begin to have BM and could not even get to the bathroom in time. Said that it made her stools very loose and watery. Therefore she stopped the Linzess. At the time of the Ov 12/09/13 she was taking 3 docusate sodium stool  softeners a day. She was taking one in the morning and 2 at night. Said that she was having a BM daily but said that it is hard and she was having to strain.  THE FOLLOWING IS THE ASSESSMENT / PLAN FROM THAT OV 12/09/2013: 1. Rectal pain - HYDROmorphone (DILAUDID) 2 MG tablet; Take 1 tablet (2 mg total) by mouth every 6 (six) hours as needed for severe pain.  Dispense: 30 tablet; Refill: 0  2. Generalized anxiety disorder - LORazepam (ATIVAN) 1 MG tablet; Take 1 tablet (1 mg total) by mouth every 8 (eight) hours as needed for anxiety.  Dispense: 90 tablet; Refill: 0  3. Panic disorder - LORazepam (ATIVAN) 1 MG tablet; Take 1 tablet (1 mg total) by mouth every 8 (eight) hours as needed for anxiety.  Dispense: 90 tablet; Refill: 0  She is going to decrease the Dilaudid to taking just one at a time. She is going to take 1 at the time of her bowel movements. She is then going to try to go without any more through the rest of the day. I am increasing her Ativan to where she can take that 3 daily. She says that for her general anxiety she has to take In the morning and another one at  night. Needs to have one additional dose per day to take when the hydromorphone seems to be wearing off.  Regarding the constipation, continue current dose of docusate sodium. Add MiraLax to this. Told her that she can adjust the amount of MiraLax depending on her stools.  Will Have her schedule a followup office visit in 2 weeks to reassess. F/U sooner if needed.     TODAY: Today she says that in regards to the constipation she did add a small amount of MiraLax and this is working well. Says that she only has to use a small amount-- less than 1 cap full-- in addition to the Docusate. Is taking one Docusate after breakfast and 2 at night. Also uses a small amount of MiraLax at 9 PM.  She has been taking 3 lorazepam per day. Only taking one hydromorphone per day and that is in the morning with her bowel  movement. Says she wants to get off the pain med completely.  Today she says that she also has developed a bump in her right vulva. Says that this past Friday night which was 12/24/13 she was applying some peroxide and antibiotic ointment and the area opened and drained. A purulent drainage. Says that it is much better but she thinks that she may need some antibiotics for it to completely resolve.  Home Meds:   Outpatient Prescriptions Prior to Visit  Medication Sig Dispense Refill  . docusate sodium (STOOL SOFTENER) 100 MG capsule Take 100 mg by mouth 2 (two) times daily. One with breakfast, two at bedtime      . doxylamine, Sleep, (UNISOM) 25 MG tablet Take 25 mg by mouth at bedtime as needed for sleep.       Marland Kitchen HYDROmorphone (DILAUDID) 2 MG tablet Take 1 tablet (2 mg total) by mouth every 6 (six) hours as needed for severe pain.  30 tablet  0  . LORazepam (ATIVAN) 1 MG tablet Take 1 tablet (1 mg total) by mouth every 8 (eight) hours as needed for anxiety.  90 tablet  0  . losartan (COZAAR) 50 MG tablet Take 50 mg by mouth daily.      . Linaclotide (LINZESS) 145 MCG CAPS capsule Take 1 capsule (145 mcg total) by mouth daily.  30 capsule  11  . oxyCODONE-acetaminophen (PERCOCET) 5-325 MG per tablet Take 2 tablets by mouth every 4 (four) hours as needed.  20 tablet  0   No facility-administered medications prior to visit.    Allergies:  Allergies  Allergen Reactions  . Amlodipine Shortness Of Breath, Anxiety and Palpitations  . Tramadol     Rushes thru body, miserable, shaking and crying  . Hydrocodone Nausea And Vomiting  . Lisinopril Other (See Comments)    unknown  . Chantix [Varenicline] Nausea Only and Anxiety      Review of Systems: See HPI for pertinent ROS. All other ROS negative.    Physical Exam: Blood pressure 116/68, pulse 80, temperature 97.7 F (36.5 C), temperature source Oral, resp. rate 18, weight 148 lb (67.132 kg)., Body mass index is 23.17 kg/(m^2). General:  WNWD WF.  Appears in no acute distress. Neck: Supple. No thyromegaly. No lymphadenopathy. Lungs: Clear bilaterally to auscultation without wheezes, rales, or rhonchi. Breathing is unlabored. Heart: Regular rhythm. No murmurs, rubs, or gallops. Msk:  Strength and tone normal for age. Extremities/Skin: Warm and dry.  Right Anterior/Superior Vulva: approx 1 cm area that is mildly firm, pink erythema.  Neuro: Alert and oriented X 3. Moves  all extremities spontaneously. Gait is normal. CNII-XII grossly in tact. Psych:  Responds to questions appropriately with a normal affect.     ASSESSMENT AND PLAN:  58 y.o. year old female with   1. Generalized anxiety disorder  2. Panic disorder  3. Rectal pain  4. Abscess of vulva - sulfamethoxazole-trimethoprim (BACTRIM DS) 800-160 MG per tablet; Take 1 tablet by mouth 2 (two) times daily.  Dispense: 20 tablet; Refill: 0  For The constipation: Tinea current dosing of Docusate and MiraLax as this is working well for her. She is to stop the Dilaudid. She can continue lorazepam 3 times a day for now. Eventually need to decrease this back down to one pill twice daily.  She still has plenty of pills remaining in her current medication bottles so no prescription was given today. For the vulvar abscess, this is already drained. Prescribed Bactrim for residual infection. If the site worsens at all or does not resolve at completion of antibiotic and she needs to follow up immediately.  She plans to schedule followup office visit with me in October for complete physical exam so that we can update preventive care.   Marin Olp [place], [place], [facility] 12/27/2013 2:56 PM

## 2014-01-12 ENCOUNTER — Telehealth: Payer: Self-pay | Admitting: Physician Assistant

## 2014-01-12 DIAGNOSIS — F411 Generalized anxiety disorder: Secondary | ICD-10-CM

## 2014-01-12 DIAGNOSIS — F410 Panic disorder [episodic paroxysmal anxiety] without agoraphobia: Secondary | ICD-10-CM

## 2014-01-12 NOTE — Telephone Encounter (Signed)
Tell  patient that at her last visit on 12/09/13 I gave her #90 because she was dealing with recent surgery et Ronney Asters. Tell her that we have to decrease the amount of this that she is using. I am decreasing the number to 60 and this must last a full month. Can send a prescription for lorazepam 1 mg 1 by mouth twice a day PRN--- #60+0 refills.

## 2014-01-12 NOTE — Telephone Encounter (Signed)
?   OK to Refill  

## 2014-01-12 NOTE — Telephone Encounter (Signed)
[phone]    PT is needing a refill on LORazepam (ATIVAN) 1 MG tablet PT will be out either Sunday or Monday and she is leaving for vacation and is going to need a refill.    But she is not sure when she can even get a refill on the medication

## 2014-01-13 MED ORDER — LORAZEPAM 1 MG PO TABS: 1.0000 mg | ORAL_TABLET | Freq: Two times a day (BID) | ORAL | Status: DC

## 2014-01-13 NOTE — Telephone Encounter (Signed)
Pt aware of the decrease and med called to pharm.

## 2014-02-15 ENCOUNTER — Other Ambulatory Visit: Payer: Self-pay | Admitting: Physician Assistant

## 2014-02-15 ENCOUNTER — Telehealth: Payer: Self-pay | Admitting: Physician Assistant

## 2014-02-15 NOTE — Telephone Encounter (Signed)
[phone]  Pt is needing a refill on LORazepam (ATIVAN) 1 MG tablet   Pharmacy [facility]

## 2014-02-15 NOTE — Telephone Encounter (Signed)
?   OK to Refill - Last refill 01/13/2014

## 2014-02-15 NOTE — Telephone Encounter (Signed)
?   OK to Refill  - LOV - 12/27/2013  Last refill - 01/13/14

## 2014-02-16 NOTE — Telephone Encounter (Signed)
Refill approved for #60+0.

## 2014-02-16 NOTE — Telephone Encounter (Signed)
Med called to pharm 

## 2014-02-16 NOTE — Telephone Encounter (Signed)
I just approved this refill in another section of In Basket.

## 2014-03-16 ENCOUNTER — Telehealth: Payer: Self-pay | Admitting: Physician Assistant

## 2014-03-16 MED ORDER — LORAZEPAM 1 MG PO TABS: 1.0000 mg | ORAL_TABLET | Freq: Two times a day (BID) | ORAL | Status: DC | PRN

## 2014-03-16 NOTE — Telephone Encounter (Signed)
Approved for # 60 + 0 

## 2014-03-16 NOTE — Telephone Encounter (Signed)
rx called in

## 2014-03-16 NOTE — Telephone Encounter (Signed)
Pharm req RF Lorazepam 1 mg  Last RF 02/16/14 #60  OK refill?

## 2014-03-21 ENCOUNTER — Encounter: Payer: BC Managed Care – HMO | Admitting: Physician Assistant

## 2014-03-31 ENCOUNTER — Other Ambulatory Visit: Payer: BC Managed Care – HMO

## 2014-03-31 DIAGNOSIS — F411 Generalized anxiety disorder: Secondary | ICD-10-CM

## 2014-03-31 DIAGNOSIS — Z79899 Other long term (current) drug therapy: Secondary | ICD-10-CM

## 2014-03-31 DIAGNOSIS — I10 Essential (primary) hypertension: Secondary | ICD-10-CM

## 2014-03-31 DIAGNOSIS — F17200 Nicotine dependence, unspecified, uncomplicated: Secondary | ICD-10-CM

## 2014-03-31 DIAGNOSIS — Z0000 Encounter for general adult medical examination without abnormal findings: Secondary | ICD-10-CM

## 2014-03-31 LAB — COMPLETE METABOLIC PANEL WITHOUT GFR
ALT: 8 U/L (ref 0–35)
AST: 13 U/L (ref 0–37)
Albumin: 3.8 g/dL (ref 3.5–5.2)
Alkaline Phosphatase: 98 U/L (ref 39–117)
BUN: 10 mg/dL (ref 6–23)
CO2: 27 meq/L (ref 19–32)
Calcium: 9.5 mg/dL (ref 8.4–10.5)
Chloride: 105 meq/L (ref 96–112)
Creat: 0.76 mg/dL (ref 0.50–1.10)
GFR, Est African American: >89 mL/min
GFR, Est Non African American: 88 mL/min
Glucose, Bld: 90 mg/dL (ref 70–99)
Potassium: 4.9 meq/L (ref 3.5–5.3)
Sodium: 139 meq/L (ref 135–145)
Total Bilirubin: 0.5 mg/dL (ref 0.2–1.2)
Total Protein: 7.3 g/dL (ref 6.0–8.3)

## 2014-03-31 LAB — LIPID PANEL
Cholesterol: 222 mg/dL — ABNORMAL HIGH (ref 0–200)
HDL: 36 mg/dL — ABNORMAL LOW
LDL Cholesterol: 161 mg/dL — ABNORMAL HIGH (ref 0–99)
Total CHOL/HDL Ratio: 6.2 ratio
Triglycerides: 123 mg/dL
VLDL: 25 mg/dL (ref 0–40)

## 2014-03-31 LAB — CBC WITH DIFFERENTIAL/PLATELET
Basophils Absolute: 0.1 10*3/uL (ref 0.0–0.1)
Basophils Relative: 1 % (ref 0–1)
EOS PCT: 6 % — AB (ref 0–5)
Eosinophils Absolute: 0.4 10*3/uL (ref 0.0–0.7)
HCT: 42.6 % (ref 36.0–46.0)
Hemoglobin: 14.3 g/dL (ref 12.0–15.0)
LYMPHS ABS: 1.9 10*3/uL (ref 0.7–4.0)
LYMPHS PCT: 29 % (ref 12–46)
MCH: 30 pg (ref 26.0–34.0)
MCHC: 33.6 g/dL (ref 30.0–36.0)
MCV: 89.3 fL (ref 78.0–100.0)
MONO ABS: 0.4 10*3/uL (ref 0.1–1.0)
Monocytes Relative: 6 % (ref 3–12)
Neutro Abs: 3.8 10*3/uL (ref 1.7–7.7)
Neutrophils Relative %: 58 % (ref 43–77)
Platelets: 295 10*3/uL (ref 150–400)
RBC: 4.77 MIL/uL (ref 3.87–5.11)
RDW: 13.5 % (ref 11.5–15.5)
WBC: 6.6 10*3/uL (ref 4.0–10.5)

## 2014-03-31 LAB — TSH: TSH: 1.513 u[IU]/mL (ref 0.350–4.500)

## 2014-04-04 ENCOUNTER — Ambulatory Visit (INDEPENDENT_AMBULATORY_CARE_PROVIDER_SITE_OTHER): Payer: BC Managed Care – HMO | Admitting: Physician Assistant

## 2014-04-04 ENCOUNTER — Encounter: Payer: Self-pay | Admitting: Physician Assistant

## 2014-04-04 VITALS — BP 162/88 | HR 76 | Temp 98.0°F | Resp 18 | Ht 62.0 in | Wt 160.0 lb

## 2014-04-04 DIAGNOSIS — F410 Panic disorder [episodic paroxysmal anxiety] without agoraphobia: Secondary | ICD-10-CM

## 2014-04-04 DIAGNOSIS — F411 Generalized anxiety disorder: Secondary | ICD-10-CM

## 2014-04-04 DIAGNOSIS — Z0000 Encounter for general adult medical examination without abnormal findings: Secondary | ICD-10-CM

## 2014-04-04 DIAGNOSIS — F17200 Nicotine dependence, unspecified, uncomplicated: Secondary | ICD-10-CM

## 2014-04-04 DIAGNOSIS — I10 Essential (primary) hypertension: Secondary | ICD-10-CM

## 2014-04-04 DIAGNOSIS — Z720 Tobacco use: Secondary | ICD-10-CM

## 2014-04-04 DIAGNOSIS — N764 Abscess of vulva: Secondary | ICD-10-CM

## 2014-04-04 DIAGNOSIS — Z23 Encounter for immunization: Secondary | ICD-10-CM

## 2014-04-04 MED ORDER — LOSARTAN POTASSIUM 100 MG PO TABS: 100.0000 mg | ORAL_TABLET | Freq: Every day | ORAL | Status: DC

## 2014-04-04 MED ORDER — SULFAMETHOXAZOLE-TMP DS 800-160 MG PO TABS: 1.0000 | ORAL_TABLET | Freq: Two times a day (BID) | ORAL | Status: DC

## 2014-04-04 NOTE — Progress Notes (Signed)
Patient ID: Danielle Morris MRN: [id number], DOB: 12/21/1955, 58 y.o. Date of Encounter: 04/04/2014,   Chief Complaint: Physical (CPE)  HPI: 58 y.o. y/o female  here for CPE.   She says she just noticed "that bump coming back again"--says she has seen me for treatment of a vulva abscess in past--says she just started noticing that area feeling irritated yesterday and this morning pressed it and "white drainage came out".  No other complaints.   She takes her blood pressure medication in the evening but has been taking it daily.  Says that she takes her lorazepam twice a day.says that she " could take 3 of them a day---and adds, "Ot,  really could use an increased dose."  She does have a gynecologist Dr. Quincy Simmonds who is at [facility]. She plans to schedule follow-up visit  with them for her GYN exam.   Review of Systems: Consitutional: No fever, chills, fatigue, night sweats, lymphadenopathy. No significant/unexplained weight changes. Eyes: No visual changes, eye redness, or discharge. ENT/Mouth: No ear pain, sore throat, nasal drainage, or sinus pain. Cardiovascular: No chest pressure,heaviness, tightness or squeezing, even with exertion. No increased shortness of breath or dyspnea on exertion.No palpitations, edema, orthopnea, PND. Respiratory: No cough, hemoptysis, SOB, or wheezing. Gastrointestinal: No anorexia, dysphagia, reflux, pain, nausea, vomiting, hematemesis, diarrhea, constipation, BRBPR, or melena. Breast: No mass, nodules, bulging, or retraction. No skin changes or inflammation. No nipple discharge. No lymphadenopathy. Genitourinary: No dysuria, hematuria, incontinence, vaginal discharge, pruritis, burning, abnormal bleeding, or pain. Musculoskeletal: No decreased ROM, No joint pain or swelling. No significant pain in neck, back, or extremities. Skin: No rash, pruritis, or concerning lesions. Neurological: No headache, dizziness, syncope, seizures, tremors, memory  loss, coordination problems, or paresthesias. Psychological: No anxiety, depression, hallucinations, SI/HI. Endocrine: No polydipsia, polyphagia, polyuria, or known diabetes.No increased fatigue. No palpitations/rapid heart rate. No significant/unexplained weight change. All other systems were reviewed and are otherwise negative.  Past Medical History  Diagnosis Date  . Hypertension   . Panic attack      Past Surgical History  Procedure Laterality Date  . Tonsillectomy      age  50  . Colonoscopy with propofol N/A 11/02/2013    Procedure: COLONOSCOPY WITH PROPOFOL;  Surgeon: Danie Binder, MD;  Location: [facility];  Service: Endoscopy;  Laterality: N/A;  in cecum @ 1002; cecal withdrawal time- minutes  . Hemorrhoid banding N/A 11/02/2013    Procedure: HEMORRHOID BANDING;  Surgeon: Danie Binder, MD;  Location: [facility];  Service: Endoscopy;  Laterality: N/A;  . Polypectomy N/A 11/02/2013    Procedure: POLYPECTOMY;  Surgeon: Danie Binder, MD;  Location: [facility];  Service: Endoscopy;  Laterality: N/A;  sigmoid colon, hepatic flexure, hyperplastic rectal, and rectal polyps  . Hemorrhoid surgery N/A 11/05/2013    Procedure: EXTENSIVE HEMORRHOIDECTOMY ;  Surgeon: Jamesetta So, MD;  Location: [facility];  Service: General;  Laterality: N/A;    Home Meds:  Outpatient Prescriptions Prior to Visit  Medication Sig Dispense Refill  . docusate sodium (STOOL SOFTENER) 100 MG capsule Take 100 mg by mouth 2 (two) times daily. One with breakfast, two at bedtime      . doxylamine, Sleep, (UNISOM) 25 MG tablet Take 25 mg by mouth at bedtime as needed for sleep.       Marland Kitchen LORazepam (ATIVAN) 1 MG tablet Take 1 tablet (1 mg total) by mouth 2 (two) times daily as needed for anxiety.  60 tablet  0  .  losartan (COZAAR) 50 MG tablet Take 50 mg by mouth daily.      . polyethylene glycol (MIRALAX / GLYCOLAX) packet Take 17 g by mouth daily.      .       .        No facility-administered medications prior to visit.     Allergies:  Allergies  Allergen Reactions  . Amlodipine Shortness Of Breath, Anxiety and Palpitations  . Tramadol     Rushes thru body, miserable, shaking and crying  . Hydrocodone Nausea And Vomiting  . Lisinopril Other (See Comments)    unknown  . Chantix [Varenicline] Nausea Only and Anxiety    History   Social History  . Marital Status: Married    Spouse Name: N/A    Number of Children: N/A  . Years of Education: N/A   Occupational History  . Not on file.   Social History Main Topics  . Smoking status: Current Some Day Smoker -- 0.50 packs/day for 10 years    Types: Cigarettes  . Smokeless tobacco: Never Used     Comment: uses E-cigs  . Alcohol Use: No  . Drug Use: No  . Sexual Activity: Not on file   Other Topics Concern  . Not on file   Social History Narrative   Entered 03/2014:   Married.    2 Daughters and son in law, and 1 grandchild live with them now.    Keeps another grandchild frequently also.   Does not work outside of house.    Family History  Problem Relation Age of Onset  . Heart disease Father 19    MI--H/O Rheumatic Fever as a child  . Diabetes Other   . Hypertension Brother   . Hypertension Brother     Physical Exam: Blood pressure 162/88, pulse 76, temperature 98 F (36.7 C), resp. rate 18, height 5\' 2"  (1.575 m), weight 160 lb (72.576 kg)., Body mass index is 29.26 kg/(m^2). General: Well developed, well nourished, WF. Appears in no acute distress. HEENT: Normocephalic, atraumatic. Conjunctiva pink, sclera non-icteric. Pupils 2 mm constricting to 1 mm, round, regular, and equally reactive to light and accomodation. EOMI. Internal auditory canal clear. TMs with good cone of light and without pathology. Nasal mucosa pink. Nares are without discharge. No sinus tenderness. Oral mucosa pink.  Pharynx without exudate.   Neck: Supple. Trachea midline. No thyromegaly. Full ROM. No lymphadenopathy.No Carotid Bruits. Lungs: Clear to  auscultation bilaterally without wheezes, rales, or rhonchi. Breathing is of normal effort and unlabored. Cardiovascular: RRR with S1 S2. No murmurs, rubs, or gallops. Distal pulses 2+ symmetrically. No carotid or abdominal bruits. Breast: Per Gyn Abdomen: Soft, non-tender, non-distended with normoactive bowel sounds. No hepatosplenomegaly or masses. No rebound/guarding. No CVA tenderness. No hernias.  Genitourinary: Per Gyn. Musculoskeletal: Full range of motion and 5/5 strength throughout. Without swelling, atrophy, tenderness, crepitus, or warmth. Extremities without clubbing, cyanosis, or edema. Calves supple. Skin: Warm and moist without erythema, ecchymosis, wounds, or rash. Neuro: A+Ox3. CN II-XII grossly intact. Moves all extremities spontaneously. Full sensation throughout. Normal gait. DTR 2+ throughout upper and lower extremities. Finger to nose intact. Psych:  Responds to questions appropriately with a normal affect.   Assessment/Plan:  58 y.o. y/o female here for CPE 1. Visit for preventive health examination  A. Screening Labs:  She came for fasting labs on 03/31/14. CBC normal CMP T normal TSH normal Lipid panel shows     Total cholesterol 222     Triglyceride  123      HDL 36      LDL 161  B. Pap: She states that she plans to schedule routine follow-up with her gynecologist for breast and pelvic exam.  C. Screening Mammogram: She  will schedule follow-up with her gynecologist to manage this.  D. DEXA/BMD:  Does not need this until closer to age 5. As well she does have gynecologist who will follow this.  E. Colorectal Cancer Screening: She had colonoscopy 11/02/13. She reports that there were 8 polyps and that she is to repeat in 3 years. She reports that this was performed by Dr. Barney Drain.  F. Immunizations:  Influenza: She defers influenza vaccine. Discussed her increased risk giving her smoking but she still defers. Tetanus: She states that she had a  tetanus vaccine about 5 years ago. Pneumococcal: She has not had any pneumonia vaccine. She is a smoker she needs Pneumovax 23. She is agreeable to receive this today.    Will not need any other pneumonia vaccine until age 31. Zostavax: Wait until age 42 to discuss.     2. Essential hypertension I Repeated blood pressure myself and got 160/100. Will increase Cozaar from 50 mg to 100 mg dose. She will schedule follow-up office visit in 2-3 weeks. Need to recheck blood pressure and BMET at that visit.  3. Smoker Patient aware of need for cessation. I have prescribed Chantix in the past she had adverse effects.  4. Hyperlipidemia See results of lipid panel under screening labs above. She does have positive cardiac risk factors including family history, hypertension, smoker. However patient states that her diet has been much worse recently and she has gained weight because of this. She states that she can definitely make diet changes. She is to decrease saturated fats in her diet and we will recheck lipid panel in the future.  4. Generalized anxiety disorder Discussed adding an SSRI in the past that she has deferred. Continue current treatment of her lorazepam twice a day. I have discussed again today issues with dependence and tolerability given benzodiazepines and that I will not increase the dose.  5. Panic disorder Discussed adding SSRI in the past but she deferred. Continue current treatment with her lorazepam twice a day. I have discussed again today issues with dependence and tolerability given benzodiazepines and that I will not increase the dose.   6. Abscess of vulva - sulfamethoxazole-trimethoprim (BACTRIM DS) 800-160 MG per tablet; Take 1 tablet by mouth 2 (two) times daily.  Dispense: 20 tablet; Refill: 0 Follow up if site worsens or does not resolve with completion of antibiotic.   8. Need for prophylactic vaccination against Streptococcus pneumoniae (pneumococcus) -  Pneumococcal polysaccharide vaccine 23-valent greater than or equal to 2yo subcutaneous/IM   Signed, [address] [place], [place], [facility] 04/04/2014 2:41 PM

## 2014-04-11 ENCOUNTER — Encounter: Payer: Self-pay | Admitting: Physician Assistant

## 2014-04-12 ENCOUNTER — Other Ambulatory Visit: Payer: Self-pay | Admitting: Physician Assistant

## 2014-04-12 NOTE — Telephone Encounter (Signed)
50 mg Losartan refill req denied as dose was changed at lost OV

## 2014-04-18 ENCOUNTER — Telehealth: Payer: Self-pay | Admitting: Physician Assistant

## 2014-04-18 ENCOUNTER — Other Ambulatory Visit: Payer: Self-pay | Admitting: Physician Assistant

## 2014-04-18 NOTE — Telephone Encounter (Signed)
Ok to refill??  Last office visit 04/04/2014.  Last refill 03/16/2014.

## 2014-04-18 NOTE — Telephone Encounter (Signed)
Already requested from pharmacy.

## 2014-04-18 NOTE — Telephone Encounter (Signed)
Patient is calling to get rx for her lorazepam if possible  Please call her when ready at [phone]

## 2014-04-18 NOTE — Telephone Encounter (Signed)
Medication called to pharmacy. 

## 2014-04-18 NOTE — Telephone Encounter (Signed)
Approved for # 60 + 0 

## 2014-04-25 ENCOUNTER — Encounter: Payer: Self-pay | Admitting: Physician Assistant

## 2014-04-25 ENCOUNTER — Ambulatory Visit (INDEPENDENT_AMBULATORY_CARE_PROVIDER_SITE_OTHER): Payer: BC Managed Care – HMO | Admitting: Physician Assistant

## 2014-04-25 VITALS — BP 126/78 | HR 76 | Temp 98.3°F | Resp 18 | Wt 158.0 lb

## 2014-04-25 DIAGNOSIS — F410 Panic disorder [episodic paroxysmal anxiety] without agoraphobia: Secondary | ICD-10-CM

## 2014-04-25 DIAGNOSIS — I10 Essential (primary) hypertension: Secondary | ICD-10-CM

## 2014-04-25 DIAGNOSIS — F411 Generalized anxiety disorder: Secondary | ICD-10-CM

## 2014-04-25 DIAGNOSIS — Z720 Tobacco use: Secondary | ICD-10-CM

## 2014-04-25 DIAGNOSIS — F17200 Nicotine dependence, unspecified, uncomplicated: Secondary | ICD-10-CM

## 2014-04-25 MED ORDER — LOSARTAN POTASSIUM 50 MG PO TABS: 50.0000 mg | ORAL_TABLET | Freq: Every day | ORAL | Status: DC

## 2014-04-25 NOTE — Progress Notes (Signed)
Patient ID: Danielle Morris MRN: [id number], DOB: 1955-12-13, 58 y.o. Date of Encounter: 04/25/2014,   Chief Complaint: F/U HTN   HPI: 58 y.o. y/o female  here for f/u of HTN.   She was recently here for complete physical exam with me on 04/04/14. At that visit her blood pressure was elevated. I told her to increase losartan from 50 mg to 100 mg daily and return in 2 weeks to recheck blood pressure and BMET.  Patient states that after that visit when she went home she continued to check her blood pressure with her home blood pressure cuff. She was getting good readings. Systolic was in the 401U and 120s. Therefore she discontinued taking the losartan 50 mg and did not take the increased dose. She is continued to take the 50 mg dose daily. She brings in her home blood pressure cuff today.  She has no other complaints today. Says that she has always been very anxious and nervous about going to a doctor's office and that this anxiety and nervousness is gotten much worse since she's recently had reactions to taking medications.  Review of Systems: Consitutional: No fever, chills, fatigue, night sweats, lymphadenopathy. No significant/unexplained weight changes. Eyes: No visual changes, eye redness, or discharge. ENT/Mouth: No ear pain, sore throat, nasal drainage, or sinus pain. Cardiovascular: No chest pressure,heaviness, tightness or squeezing, even with exertion. No increased shortness of breath or dyspnea on exertion.No palpitations, edema, orthopnea, PND. Respiratory: No cough, hemoptysis, SOB, or wheezing. Gastrointestinal: No anorexia, dysphagia, reflux, pain, nausea, vomiting, hematemesis, diarrhea, constipation, BRBPR, or melena. Breast: No mass, nodules, bulging, or retraction. No skin changes or inflammation. No nipple discharge. No lymphadenopathy. Genitourinary: No dysuria, hematuria, incontinence, vaginal discharge, pruritis, burning, abnormal bleeding, or  pain. Musculoskeletal: No decreased ROM, No joint pain or swelling. No significant pain in neck, back, or extremities. Skin: No rash, pruritis, or concerning lesions. Neurological: No headache, dizziness, syncope, seizures, tremors, memory loss, coordination problems, or paresthesias. Psychological: No anxiety, depression, hallucinations, SI/HI. Endocrine: No polydipsia, polyphagia, polyuria, or known diabetes.No increased fatigue. No palpitations/rapid heart rate. No significant/unexplained weight change. All other systems were reviewed and are otherwise negative.  Past Medical History  Diagnosis Date  . Hypertension   . Panic attack      Past Surgical History  Procedure Laterality Date  . Tonsillectomy      age  42  . Colonoscopy with propofol N/A 11/02/2013    Procedure: COLONOSCOPY WITH PROPOFOL;  Surgeon: Danie Binder, MD;  Location: [facility];  Service: Endoscopy;  Laterality: N/A;  in cecum @ 1002; cecal withdrawal time- minutes  . Hemorrhoid banding N/A 11/02/2013    Procedure: HEMORRHOID BANDING;  Surgeon: Danie Binder, MD;  Location: [facility];  Service: Endoscopy;  Laterality: N/A;  . Polypectomy N/A 11/02/2013    Procedure: POLYPECTOMY;  Surgeon: Danie Binder, MD;  Location: [facility];  Service: Endoscopy;  Laterality: N/A;  sigmoid colon, hepatic flexure, hyperplastic rectal, and rectal polyps  . Hemorrhoid surgery N/A 11/05/2013    Procedure: EXTENSIVE HEMORRHOIDECTOMY ;  Surgeon: Jamesetta So, MD;  Location: [facility];  Service: General;  Laterality: N/A;    Home Meds:  Outpatient Prescriptions Prior to Visit  Medication Sig Dispense Refill  . docusate sodium (STOOL SOFTENER) 100 MG capsule Take 100 mg by mouth 2 (two) times daily. One with breakfast, two at bedtime      . doxylamine, Sleep, (UNISOM) 25 MG tablet Take 25 mg by mouth  at bedtime as needed for sleep.       Marland Kitchen LORazepam (ATIVAN) 1 MG tablet Take 1 tablet (1 mg total) by mouth 2 (two) times daily as needed for  anxiety.  60 tablet  0  . losartan (COZAAR) 50 MG tablet Take 50 mg by mouth daily.      . polyethylene glycol (MIRALAX / GLYCOLAX) packet Take 17 g by mouth daily.      .       .        No facility-administered medications prior to visit.    Allergies:  Allergies  Allergen Reactions  . Amlodipine Shortness Of Breath, Anxiety and Palpitations  . Tramadol     Rushes thru body, miserable, shaking and crying  . Hydrocodone Nausea And Vomiting  . Lisinopril Other (See Comments)    unknown  . Chantix [Varenicline] Nausea Only and Anxiety    History   Social History  . Marital Status: Married    Spouse Name: N/A    Number of Children: N/A  . Years of Education: N/A   Occupational History  . Not on file.   Social History Main Topics  . Smoking status: Current Some Day Smoker -- 0.50 packs/day for 10 years    Types: Cigarettes  . Smokeless tobacco: Never Used     Comment: uses E-cigs  . Alcohol Use: No  . Drug Use: No  . Sexual Activity: Not on file   Other Topics Concern  . Not on file   Social History Narrative   Entered 03/2014:   Married.    2 Daughters and son in law, and 1 grandchild live with them now.    Keeps another grandchild frequently also.   Does not work outside of house.    Family History  Problem Relation Age of Onset  . Heart disease Father 43    MI--H/O Rheumatic Fever as a child  . Diabetes Other   . Hypertension Brother   . Hypertension Brother     Physical Exam: Blood pressure 126/78, pulse 76, temperature 98.3 F (36.8 C), temperature source Oral, resp. rate 18, weight 158 lb (71.668 kg)., Body mass index is 28.89 kg/(m^2). General: Well developed, well nourished, WF. Appears in no acute distress. Neck: Supple. Trachea midline. No thyromegaly. Full ROM. No lymphadenopathy.No Carotid Bruits. Lungs: Clear to auscultation bilaterally without wheezes, rales, or rhonchi. Breathing is of normal effort and unlabored. Cardiovascular: RRR with  S1 S2. No murmurs, rubs, or gallops. Distal pulses 2+ symmetrically. No carotid or abdominal bruits. Musculoskeletal: Full range of motion and 5/5 strength throughout.  Skin: Warm and moist. Neuro: A+Ox3. CN II-XII grossly intact. Moves all extremities spontaneously. Full sensation throughout. Normal gait. Psych:  Responds to questions appropriately with a normal affect.   Assessment/Plan:  58 y.o. y/o female here for  Nurse got normal blood pressure reading as above. While I was in the exam room had patient put on her home blood pressure cuff on her left arm and she got 140/86. Immediately after that I checked her blood pressure on the left arm using our office cuff and I got 140/90. This shows that her blood pressure cuff is accurate but does show that she does develop some anxiety with being in the doctor's office.  I discussed with her my concern of what her blood pressure is running during any kind of stress on a day-to-day basis. She usually checks her blood pressure when she is calm and at rest such as  when watching TV. I told her to start checking it even with stressful situations to see what her blood pressure is running at those times.  She will continue the losartan 50 mg dose at this time. No medication change at this time she will continue to monitor.   She had CPE with me 04/04/2014. The following is copied from that note:   Visit for preventive health examination  A. Screening Labs:  She came for fasting labs on 03/31/14. CBC normal CMP T normal TSH normal Lipid panel shows     Total cholesterol 222     Triglyceride 123      HDL 36      LDL 161  B. Pap: She states that she plans to schedule routine follow-up with her gynecologist for breast and pelvic exam.  C. Screening Mammogram: She  will schedule follow-up with her gynecologist to manage this.  D. DEXA/BMD:  Does not need this until closer to age 52. As well she does have gynecologist who will follow  this.  E. Colorectal Cancer Screening: She had colonoscopy 11/02/13. She reports that there were 8 polyps and that she is to repeat in 3 years. She reports that this was performed by Dr. Barney Drain.  F. Immunizations:  Influenza: She defers influenza vaccine. Discussed her increased risk giving her smoking but she still defers. Tetanus: She states that she had a tetanus vaccine about 5 years ago. Pneumococcal: She has not had any pneumonia vaccine. She is a smoker she needs Pneumovax 23. She is agreeable to receive this today.    Will not need any other pneumonia vaccine until age 36. Zostavax: Wait until age 82 to discuss.     2. Essential hypertension See Above--  3. Smoker Patient aware of need for cessation. I have prescribed Chantix in the past she had adverse effects.  4. Hyperlipidemia See results of lipid panel under screening labs above. She does have positive cardiac risk factors including family history, hypertension, smoker. However patient states that her diet has been much worse recently and she has gained weight because of this. She states that she can definitely make diet changes. She is to decrease saturated fats in her diet and we will recheck lipid panel in the future.  4. Generalized anxiety disorder Discussed adding an SSRI in the past that she has deferred. Continue current treatment of her lorazepam twice a day. I have discussed again today issues with dependence and tolerability given benzodiazepines and that I will not increase the dose.  5. Panic disorder Discussed adding SSRI in the past but she deferred. Continue current treatment with her lorazepam twice a day. I have discussed again today issues with dependence and tolerability given benzodiazepines and that I will not increase the dose.   Need for prophylactic vaccination against Streptococcus pneumoniae (pneumococcus) - Pneumococcal polysaccharide vaccine 23-valent greater than or equal to 2yo  subcutaneous/IM   Signed, [address] [place], [place], [facility] 04/25/2014 3:14 PM

## 2014-05-16 ENCOUNTER — Telehealth: Payer: Self-pay | Admitting: Physician Assistant

## 2014-05-16 ENCOUNTER — Other Ambulatory Visit: Payer: Self-pay | Admitting: Physician Assistant

## 2014-05-16 NOTE — Telephone Encounter (Signed)
Last Rf 11/9 #60  Last OV 11/16  OK refill?

## 2014-05-16 NOTE — Telephone Encounter (Signed)
rx called in

## 2014-05-16 NOTE — Telephone Encounter (Signed)
[phone]  Pt is needing a refill on LORazepam (ATIVAN) 1 MG tablet

## 2014-05-16 NOTE — Telephone Encounter (Signed)
Approved for #60+2. Tell her and the pharmacy that this must last for 3 months and will not fill early.

## 2014-08-12 ENCOUNTER — Telehealth: Payer: Self-pay | Admitting: Physician Assistant

## 2014-08-12 NOTE — Telephone Encounter (Signed)
LRF 05/16/14 #60 + 2  LOV 04/25/14  OK refill?

## 2014-08-12 NOTE — Telephone Encounter (Signed)
[phone]  PT is needing a refill on LORazepam (ATIVAN) 1 MG tablet

## 2014-08-15 ENCOUNTER — Other Ambulatory Visit: Payer: Self-pay | Admitting: Physician Assistant

## 2014-08-15 MED ORDER — LORAZEPAM 1 MG PO TABS: ORAL_TABLET | ORAL | Status: DC

## 2014-08-15 NOTE — Telephone Encounter (Signed)
RX called in pt aware

## 2014-08-15 NOTE — Telephone Encounter (Signed)
rx was just called in this AM

## 2014-08-15 NOTE — Telephone Encounter (Signed)
Approved for #60+2 refills. As we have done in the past, please make a note that must last complete month and will not fill early.

## 2014-10-05 ENCOUNTER — Encounter: Payer: Self-pay | Admitting: Gastroenterology

## 2014-11-10 ENCOUNTER — Other Ambulatory Visit: Payer: Self-pay | Admitting: Physician Assistant

## 2014-11-10 ENCOUNTER — Telehealth: Payer: Self-pay | Admitting: Physician Assistant

## 2014-11-10 NOTE — Telephone Encounter (Signed)
Ok to refill??  Last office visit 04/25/2014.  Last refill 08/15/2014, #2 refills.

## 2014-11-10 NOTE — Telephone Encounter (Signed)
ok 

## 2014-11-10 NOTE — Telephone Encounter (Signed)
Patient calling for refill of lorazepam - will take last tablet on Sunday [phone]

## 2014-11-10 NOTE — Telephone Encounter (Signed)
Medication called to pharmacy. 

## 2014-11-10 NOTE — Telephone Encounter (Signed)
Per phone note, medication approved by MD and called to pharmacy.

## 2015-02-08 ENCOUNTER — Other Ambulatory Visit: Payer: Self-pay | Admitting: Family Medicine

## 2015-02-08 NOTE — Telephone Encounter (Signed)
?   OK to Refill - last refill 11/10/2014 #60/2

## 2015-02-08 NOTE — Telephone Encounter (Signed)
Approved # 60 + 2 

## 2015-02-09 NOTE — Telephone Encounter (Signed)
Medication refilled per protocol. 

## 2015-04-18 ENCOUNTER — Other Ambulatory Visit: Payer: Self-pay | Admitting: Physician Assistant

## 2015-04-19 ENCOUNTER — Encounter: Payer: Self-pay | Admitting: Family Medicine

## 2015-04-19 NOTE — Telephone Encounter (Signed)
Medication refill for one time only.  Patient needs to be seen.  Letter sent for patient to call and schedule 

## 2015-04-25 ENCOUNTER — Telehealth: Payer: Self-pay | Admitting: Physician Assistant

## 2015-04-25 NOTE — Telephone Encounter (Signed)
Pt received a letter stating that she needs an OV in order to refill her medication next month. She says that she can not come in for an office visit until January because she can not get a ride. She would like for [place] to refill all of her medications when the time comes to last until she can come back in. [phone]

## 2015-04-25 NOTE — Telephone Encounter (Signed)
Please disregard last message, patient made appointment for November 30th

## 2015-05-10 ENCOUNTER — Ambulatory Visit (INDEPENDENT_AMBULATORY_CARE_PROVIDER_SITE_OTHER): Payer: BLUE CROSS/BLUE SHIELD | Admitting: Physician Assistant

## 2015-05-10 ENCOUNTER — Encounter: Payer: Self-pay | Admitting: Physician Assistant

## 2015-05-10 VITALS — BP 152/84 | HR 88 | Temp 98.2°F | Resp 18 | Wt 149.0 lb

## 2015-05-10 DIAGNOSIS — Z720 Tobacco use: Secondary | ICD-10-CM | POA: Diagnosis not present

## 2015-05-10 DIAGNOSIS — I10 Essential (primary) hypertension: Secondary | ICD-10-CM

## 2015-05-10 DIAGNOSIS — F411 Generalized anxiety disorder: Secondary | ICD-10-CM | POA: Diagnosis not present

## 2015-05-10 DIAGNOSIS — F410 Panic disorder [episodic paroxysmal anxiety] without agoraphobia: Secondary | ICD-10-CM

## 2015-05-10 DIAGNOSIS — F17200 Nicotine dependence, unspecified, uncomplicated: Secondary | ICD-10-CM

## 2015-05-10 MED ORDER — SERTRALINE HCL 50 MG PO TABS: 50.0000 mg | ORAL_TABLET | Freq: Every day | ORAL | Status: DC

## 2015-05-10 MED ORDER — LOSARTAN POTASSIUM 50 MG PO TABS: 50.0000 mg | ORAL_TABLET | Freq: Every day | ORAL | Status: DC

## 2015-05-10 MED ORDER — LORAZEPAM 1 MG PO TABS: ORAL_TABLET | ORAL | Status: DC

## 2015-05-10 NOTE — Progress Notes (Signed)
Patient ID: Danielle Morris MRN: [id number], DOB: 1956-02-07, 59 y.o. Date of Encounter: 05/10/2015,   Chief Complaint: F/U HTN   HPI: 59 y.o. y/o female here for f/u of HTN, Anxiety, Panic Disorders.   Her last office visit with me was 04/25/2014. Today she states that she "has been stressed out." Says that she was feeling panicked just coming here, just having to come to the doctor's office. Says that also there is stress at home-- her husband just recently started a new job, she is having to watch the grandkids, says that her husband had to take off work to bring her here for her visit today.  Also states that she checks her blood pressure and gets systolic 0000000 or less every time she checks it. I did review her chart and at her visit here 04/2014 she brought in her blood pressure cuff and it was reading the same as our blood pressure cuff here and her home blood pressure cuff is accurate.  In the past, I believe around April 2015, she had reactions to different medications and had to go to the ER because of those medication reactions. She gets teary-eyed today stating that she is just scared to try new medicines because she is afraid of how they're going to affect her.  Also reviewed with her that my office note 09/27/13 her husband was with her at that visit and he really felt that she needed to get on a daily SSRI/SNRI. At that visit she also had reported to me that she had used Zoloft 15 years ago. Discussed this with her today.   Review of Systems: Consitutional: No fever, chills, fatigue, night sweats, lymphadenopathy. No significant/unexplained weight changes. Eyes: No visual changes, eye redness, or discharge. ENT/Mouth: No ear pain, sore throat, nasal drainage, or sinus pain. Cardiovascular: No chest pressure,heaviness, tightness or squeezing, even with exertion. No increased shortness of breath or dyspnea on exertion.No palpitations, edema, orthopnea, PND. Respiratory: No  cough, hemoptysis, SOB, or wheezing. Gastrointestinal: No anorexia, dysphagia, reflux, pain, nausea, vomiting, hematemesis, diarrhea, constipation, BRBPR, or melena. Breast: No mass, nodules, bulging, or retraction. No skin changes or inflammation. No nipple discharge. No lymphadenopathy. Genitourinary: No dysuria, hematuria, incontinence, vaginal discharge, pruritis, burning, abnormal bleeding, or pain. Musculoskeletal: No decreased ROM, No joint pain or swelling. No significant pain in neck, back, or extremities. Skin: No rash, pruritis, or concerning lesions. Neurological: No headache, dizziness, syncope, seizures, tremors, memory loss, coordination problems, or paresthesias. Psychological: No  hallucinations, SI/HI. Endocrine: No polydipsia, polyphagia, polyuria, or known diabetes.No increased fatigue. No palpitations/rapid heart rate. No significant/unexplained weight change. All other systems were reviewed and are otherwise negative.  Past Medical History  Diagnosis Date  . Hypertension   . Panic attack      Past Surgical History  Procedure Laterality Date  . Tonsillectomy      age  59  . Colonoscopy with propofol N/A 11/02/2013    Procedure: COLONOSCOPY WITH PROPOFOL;  Surgeon: Danie Binder, MD;  Location: [facility];  Service: Endoscopy;  Laterality: N/A;  in cecum @ 1002; cecal withdrawal time- minutes  . Hemorrhoid banding N/A 11/02/2013    Procedure: HEMORRHOID BANDING;  Surgeon: Danie Binder, MD;  Location: [facility];  Service: Endoscopy;  Laterality: N/A;  . Polypectomy N/A 11/02/2013    Procedure: POLYPECTOMY;  Surgeon: Danie Binder, MD;  Location: [facility];  Service: Endoscopy;  Laterality: N/A;  sigmoid colon, hepatic flexure, hyperplastic rectal, and rectal polyps  .  Hemorrhoid surgery N/A 11/05/2013    Procedure: EXTENSIVE HEMORRHOIDECTOMY ;  Surgeon: Jamesetta So, MD;  Location: [facility];  Service: General;  Laterality: N/A;    Home Meds:  Outpatient Prescriptions Prior to  Visit  Medication Sig Dispense Refill  . docusate sodium (STOOL SOFTENER) 100 MG capsule Take 100 mg by mouth 2 (two) times daily. One with breakfast, two at bedtime      . doxylamine, Sleep, (UNISOM) 25 MG tablet Take 25 mg by mouth at bedtime as needed for sleep.       Marland Kitchen LORazepam (ATIVAN) 1 MG tablet Take 1 tablet (1 mg total) by mouth 2 (two) times daily as needed for anxiety.  60 tablet  0  . losartan (COZAAR) 50 MG tablet Take 50 mg by mouth daily.      . polyethylene glycol (MIRALAX / GLYCOLAX) packet Take 17 g by mouth daily.      .       .        No facility-administered medications prior to visit.    Allergies:  Allergies  Allergen Reactions  . Amlodipine Shortness Of Breath, Anxiety and Palpitations  . Tramadol     Rushes thru body, miserable, shaking and crying  . Hydrocodone Nausea And Vomiting  . Lisinopril Other (See Comments)    unknown  . Chantix [Varenicline] Nausea Only and Anxiety    Social History   Social History  . Marital Status: Married    Spouse Name: N/A  . Number of Children: N/A  . Years of Education: N/A   Occupational History  . Not on file.   Social History Main Topics  . Smoking status: Current Some Day Smoker -- 0.50 packs/day for 10 years    Types: Cigarettes  . Smokeless tobacco: Never Used     Comment: uses E-cigs  . Alcohol Use: No  . Drug Use: No  . Sexual Activity: Not on file   Other Topics Concern  . Not on file   Social History Narrative   Entered 03/2014:   Married.    2 Daughters and son in law, and 1 grandchild live with them now.    Keeps another grandchild frequently also.   Does not work outside of house.    Family History  Problem Relation Age of Onset  . Heart disease Father 45    MI--H/O Rheumatic Fever as a child  . Diabetes Other   . Hypertension Brother   . Hypertension Brother     Physical Exam: Blood pressure 152/84, pulse 88, temperature 98.2 F (36.8 C), temperature source Oral, resp. rate  18, weight 149 lb (67.586 kg)., Body mass index is 27.25 kg/(m^2). General: Well developed, well nourished, WF. Appears in no acute distress. Neck: Supple. Trachea midline. No thyromegaly. Full ROM. No lymphadenopathy.No Carotid Bruits. Lungs: Clear to auscultation bilaterally without wheezes, rales, or rhonchi. Breathing is of normal effort and unlabored. Cardiovascular: RRR with S1 S2. No murmurs, rubs, or gallops. Distal pulses 2+ symmetrically. No carotid or abdominal bruits. Musculoskeletal: Full range of motion and 5/5 strength throughout.  Skin: Warm and moist. Neuro: A+Ox3. CN II-XII grossly intact. Moves all extremities spontaneously. Full sensation throughout. Normal gait. Psych:  She become slightly teary-eyed talking about her "stress ".  Also becomes slightly teary-eyed talking about her fear of having reactions to medications again. Otherwise she is appropriate.   Assessment/Plan:  59 y.o. y/o female here for  Essential hypertension In the past she has brought in her  home blood pressure cuff and it was accurate. Been getting good readings at home. She has a history of "white coat syndrome" and panic. 2 new current blood pressure medication. Check lab to monitor. - BASIC METABOLIC PANEL WITH GFR - losartan (COZAAR) 50 MG tablet; Take 1 tablet (50 mg total) by mouth daily.  Dispense: 90 tablet; Refill: 1   Generalized anxiety disorder In the past, I discussed adding an SSRI in the past that she has deferred. I continue current treatment of her lorazepam twice a day. I have discussed again today issues with dependence and tolerability given benzodiazepines and that I will not increase the dose. However, today she is finally agreeable to try the Zoloft. She wants to do Zoloft rather than a different medicine because she said that she does note that she use that 15 years ago and it did not cause any adverse effects to her at that point so she is hoping that it will not cause her any  side effects now. I had a long discussion with her discussing expectations of this medicine and how it will work. She is to schedule follow-up office visit with me in 6 weeks. She has adverse effects with this medicine she is to call us. Otherwise she is to take the pill daily until her follow-up visit with me in 6 weeks. Continue using the current dose of Ativan at the current twice a day dosing until her follow-up appointment.  - sertraline (ZOLOFT) 50 MG tablet; Take 1 tablet (50 mg total) by mouth daily.  Dispense: 30 tablet; Refill: 1 - LORazepam (ATIVAN) 1 MG tablet; TAKE 1 TABLET BY MOUTH TWICE A DAY AS NEEDED FOR ANXIETY  Dispense: 60 tablet; Refill: 1  Panic disorder In the past, I discussed adding an SSRI in the past that she has deferred. I continue current treatment of her lorazepam twice a day. I have discussed again today issues with dependence and tolerability given benzodiazepines and that I will not increase the dose. However, today she is finally agreeable to try the Zoloft. She wants to do Zoloft rather than a different medicine because she said that she does note that she use that 15 years ago and it did not cause any adverse effects to her at that point so she is hoping that it will not cause her any side effects now. I had a long discussion with her discussing expectations of this medicine and how it will work. She is to schedule follow-up office visit with me in 6 weeks. She has adverse effects with this medicine she is to call us. Otherwise she is to take the pill daily until her follow-up visit with me in 6 weeks. Continue using the current dose of Ativan at the current twice a day dosing until her follow-up appointment.  - sertraline (ZOLOFT) 50 MG tablet; Take 1 tablet (50 mg total) by mouth daily.  Dispense: 30 tablet; Refill: 1 - LORazepam (ATIVAN) 1 MG tablet; TAKE 1 TABLET BY MOUTH TWICE A DAY AS NEEDED FOR ANXIETY  Dispense: 60 tablet; Refill: 1  Smoker Patient aware  of need for cessation. I have prescribed Chantix in the past she had adverse effects.  Hyperlipidemia See results of lipid panel under screening labs below. She does have positive cardiac risk factors including family history, hypertension, smoker. However at [place] at that time, patient stated that her diet had been much worse recently and she has gained weight because of this. She stated that she could definitely make  diet changes. She is to decrease saturated fats in her diet and we will recheck lipid panel in the future. However she has had no follow-up with me in the past year. She is not fasting today. We will need to reassess this in the near future.   Follow-up office visit 6 weeks. Follow-up immediately if she thinks that she is having adverse effects with her medication.   She had CPE with me 04/04/2014. The following is copied from that note:   Visit for preventive health examination  A. Screening Labs:  She came for fasting labs on 03/31/14. CBC normal CMP T normal TSH normal Lipid panel shows     Total cholesterol 222     Triglyceride 123      HDL 36      LDL 161  B. Pap: She states that she plans to schedule routine follow-up with her gynecologist for breast and pelvic exam.  C. Screening Mammogram: She  will schedule follow-up with her gynecologist to manage this.  D. DEXA/BMD:  Does not need this until closer to age 80. As well she does have gynecologist who will follow this.  E. Colorectal Cancer Screening: She had colonoscopy 11/02/13. She reports that there were 8 polyps and that she is to repeat in 3 years. She reports that this was performed by Dr. Barney Drain.  F. Immunizations:  Influenza: She defers influenza vaccine. Discussed her increased risk giving her smoking but she still defers. Tetanus: She states that she had a tetanus vaccine about 5 years ago. Pneumococcal: She has not had any pneumonia vaccine. She is a smoker she needs Pneumovax 23. She is  agreeable to receive this today.    Will not need any other pneumonia vaccine until age 37. Zostavax: Wait until age 3 to discuss.       Signed, [address] [place], [place], [facility] 05/10/2015 2:25 PM

## 2015-05-11 LAB — BASIC METABOLIC PANEL WITH GFR
BUN: 9 mg/dL (ref 7–25)
CO2: 28 mmol/L (ref 20–31)
CREATININE: 0.69 mg/dL (ref 0.50–1.05)
Calcium: 9.2 mg/dL (ref 8.6–10.4)
Chloride: 99 mmol/L (ref 98–110)
GFR, Est African American: >89 mL/min (ref >=60)
GFR, Est Non African American: >89 mL/min (ref >=60)
Glucose, Bld: 83 mg/dL (ref 70–99)
POTASSIUM: 4.5 mmol/L (ref 3.5–5.3)
Sodium: 136 mmol/L (ref 135–146)

## 2015-06-21 ENCOUNTER — Ambulatory Visit (INDEPENDENT_AMBULATORY_CARE_PROVIDER_SITE_OTHER): Payer: BLUE CROSS/BLUE SHIELD | Admitting: Physician Assistant

## 2015-06-21 ENCOUNTER — Encounter: Payer: Self-pay | Admitting: Physician Assistant

## 2015-06-21 VITALS — BP 172/94 | HR 64 | Temp 98.3°F | Resp 18 | Wt 153.0 lb

## 2015-06-21 DIAGNOSIS — Z720 Tobacco use: Secondary | ICD-10-CM

## 2015-06-21 DIAGNOSIS — F411 Generalized anxiety disorder: Secondary | ICD-10-CM | POA: Diagnosis not present

## 2015-06-21 DIAGNOSIS — F410 Panic disorder [episodic paroxysmal anxiety] without agoraphobia: Secondary | ICD-10-CM | POA: Diagnosis not present

## 2015-06-21 DIAGNOSIS — F17200 Nicotine dependence, unspecified, uncomplicated: Secondary | ICD-10-CM

## 2015-06-21 DIAGNOSIS — I10 Essential (primary) hypertension: Secondary | ICD-10-CM | POA: Diagnosis not present

## 2015-06-21 MED ORDER — SERTRALINE HCL 100 MG PO TABS: 100.0000 mg | ORAL_TABLET | Freq: Every day | ORAL | Status: DC

## 2015-06-21 NOTE — Progress Notes (Signed)
Patient ID: Danielle Morris MRN: [id number], DOB: 1956/01/08, 60 y.o. Date of Encounter: 06/21/2015,   Chief Complaint: F/U HTN   HPI: 60 y.o. y/o female here for f/u of HTN, Anxiety, Panic Disorders.   OV 05/10/2015: Her last office visit with me was 04/25/2014. Today she states that she "has been stressed out." Says that she was feeling panicked just coming here, just having to come to the doctor's office. Says that also there is stress at home-- her husband just recently started a new job, she is having to watch the grandkids, says that her husband had to take off work to bring her here for her visit today.  Also states that she checks her blood pressure and gets systolic 0000000 or less every time she checks it. I did review her chart and at her visit here 04/2014 she brought in her blood pressure cuff and it was reading the same as our blood pressure cuff here and her home blood pressure cuff is accurate.  In the past, I believe around April 2015, she had reactions to different medications and had to go to the ER because of those medication reactions. She gets teary-eyed today stating that she is just scared to try new medicines because she is afraid of how they're going to affect her.  Also reviewed with her that my office note 09/27/13 her husband was with her at that visit and he really felt that she needed to get on a daily SSRI/SNRI. At that visit she also had reported to me that she had used Zoloft 15 years ago. Discussed this with her today.  AT THAT OV: Added Zoloft 50mg  QD.    OV 06/21/2015:   She states she is taking the Zolft 50mg .  Says she isnt crying every day like she was.  Says she is feeling better.  Having no adverse effects.  Says she has gone to 2 funerals recently, has had a lot of stress.  Says she takes the lorazepam at 1:30 and 8: 30. I asked if she takes this PRN or scheduled. She says she takes it at those times every day "just to make sure nothing happens."    Says recently, even on Zoloft "almost went into a panic attack. " Says "has a lot of stress----keeping grandkids, husband working 12 hour shifts, etc"  Review of Systems: Consitutional: No fever, chills, fatigue, night sweats, lymphadenopathy. No significant/unexplained weight changes. Eyes: No visual changes, eye redness, or discharge. ENT/Mouth: No ear pain, sore throat, nasal drainage, or sinus pain. Cardiovascular: No chest pressure,heaviness, tightness or squeezing, even with exertion. No increased shortness of breath or dyspnea on exertion.No palpitations, edema, orthopnea, PND. Respiratory: No cough, hemoptysis, SOB, or wheezing. Gastrointestinal: No anorexia, dysphagia, reflux, pain, nausea, vomiting, hematemesis, diarrhea, constipation, BRBPR, or melena. Breast: No mass, nodules, bulging, or retraction. No skin changes or inflammation. No nipple discharge. No lymphadenopathy. Genitourinary: No dysuria, hematuria, incontinence, vaginal discharge, pruritis, burning, abnormal bleeding, or pain. Musculoskeletal: No decreased ROM, No joint pain or swelling. No significant pain in neck, back, or extremities. Skin: No rash, pruritis, or concerning lesions. Neurological: No headache, dizziness, syncope, seizures, tremors, memory loss, coordination problems, or paresthesias. Psychological: No  hallucinations, SI/HI. Endocrine: No polydipsia, polyphagia, polyuria, or known diabetes.No increased fatigue. No palpitations/rapid heart rate. No significant/unexplained weight change. All other systems were reviewed and are otherwise negative.  Past Medical History  Diagnosis Date  . Hypertension   . Panic attack  Past Surgical History  Procedure Laterality Date  . Tonsillectomy      age  82  . Colonoscopy with propofol N/A 11/02/2013    Procedure: COLONOSCOPY WITH PROPOFOL;  Surgeon: Danie Binder, MD;  Location: [facility];  Service: Endoscopy;  Laterality: N/A;  in cecum @ 1002; cecal  withdrawal time- minutes  . Hemorrhoid banding N/A 11/02/2013    Procedure: HEMORRHOID BANDING;  Surgeon: Danie Binder, MD;  Location: [facility];  Service: Endoscopy;  Laterality: N/A;  . Polypectomy N/A 11/02/2013    Procedure: POLYPECTOMY;  Surgeon: Danie Binder, MD;  Location: [facility];  Service: Endoscopy;  Laterality: N/A;  sigmoid colon, hepatic flexure, hyperplastic rectal, and rectal polyps  . Hemorrhoid surgery N/A 11/05/2013    Procedure: EXTENSIVE HEMORRHOIDECTOMY ;  Surgeon: Jamesetta So, MD;  Location: [facility];  Service: General;  Laterality: N/A;    Home Meds:  Outpatient Prescriptions Prior to Visit  Medication Sig Dispense Refill  . docusate sodium (STOOL SOFTENER) 100 MG capsule Take 100 mg by mouth 2 (two) times daily. One with breakfast, two at bedtime      . doxylamine, Sleep, (UNISOM) 25 MG tablet Take 25 mg by mouth at bedtime as needed for sleep.       Marland Kitchen LORazepam (ATIVAN) 1 MG tablet Take 1 tablet (1 mg total) by mouth 2 (two) times daily as needed for anxiety.  60 tablet  0  . losartan (COZAAR) 50 MG tablet Take 50 mg by mouth daily.      . polyethylene glycol (MIRALAX / GLYCOLAX) packet Take 17 g by mouth daily.      .       .        No facility-administered medications prior to visit.    Allergies:  Allergies  Allergen Reactions  . Amlodipine Shortness Of Breath, Anxiety and Palpitations  . Tramadol     Rushes thru body, miserable, shaking and crying  . Hydrocodone Nausea And Vomiting  . Lisinopril Other (See Comments)    unknown  . Chantix [Varenicline] Nausea Only and Anxiety    Social History   Social History  . Marital Status: Married    Spouse Name: N/A  . Number of Children: N/A  . Years of Education: N/A   Occupational History  . Not on file.   Social History Main Topics  . Smoking status: Current Some Day Smoker -- 0.50 packs/day for 10 years    Types: Cigarettes  . Smokeless tobacco: Never Used     Comment: uses E-cigs  . Alcohol  Use: No  . Drug Use: No  . Sexual Activity: Not on file   Other Topics Concern  . Not on file   Social History Narrative   Entered 03/2014:   Married.    2 Daughters and son in law, and 1 grandchild live with them now.    Keeps another grandchild frequently also.   Does not work outside of house.    Family History  Problem Relation Age of Onset  . Heart disease Father 106    MI--H/O Rheumatic Fever as a child  . Diabetes Other   . Hypertension Brother   . Hypertension Brother     Physical Exam: Blood pressure 172/94, pulse 64, temperature 98.3 F (36.8 C), temperature source Oral, resp. rate 18, weight 153 lb (69.4 kg)., Body mass index is 27.98 kg/(m^2). General: Well developed, well nourished, WF. Appears in no acute distress. Neck: Supple. Trachea midline. No  thyromegaly. Full ROM. No lymphadenopathy.No Carotid Bruits. Lungs: Clear to auscultation bilaterally without wheezes, rales, or rhonchi. Breathing is of normal effort and unlabored. Cardiovascular: RRR with S1 S2. No murmurs, rubs, or gallops. Distal pulses 2+ symmetrically. No carotid or abdominal bruits. Musculoskeletal: Full range of motion and 5/5 strength throughout.  Skin: Warm and moist. Neuro: A+Ox3. CN II-XII grossly intact. Moves all extremities spontaneously. Full sensation throughout. Normal gait. Psych:  She become slightly teary-eyed talking about her "stress ".  Also becomes slightly teary-eyed talking about her fear of having reactions to medications again. Otherwise she is appropriate.   Assessment/Plan:  60 y.o. y/o female here for  Essential hypertension At OV 05/10/15--Documented: "In the past she has brought in her home blood pressure cuff and it was accurate. Been getting good readings at home. She has a history of "white coat syndrome" and panic.                                                   Continue current blood pressure medication. Check lab to monitor.                                        - BASIC METABOLIC PANEL WITH GFR                                       - losartan (COZAAR) 50 MG tablet; Take 1 tablet (50 mg total) by mouth daily.  Dispense: 90 tablet; Refill: 1 At [place] 06/21/15---I rechecked BP myself 20 minutes later---getting 150/82 Will cont currnet med now wihtout change and monitor at next OV (Do not want to make more than 1 change at a time givne her h/o sensitivity to meds)   Generalized anxiety disorder At OV 06/21/15--Increased dose Zolfot to 100mg  QD.  Pt to call if has adv effects.  O/W. F/u OV 2 months.  Cont lorazepam at currnet dosing for now---hopefully, evetyually, Zoloft will be more effective and can sdecrease lorazepam to PRN dosing   Panic disorder IAt OV 06/21/15--Increased dose Zolfot to 100mg  QD.  Pt to call if has adv effects.  O/W. F/u OV 2 months.  Cont lorazepam at currnet dosing for now---hopefully, evetyually, Zoloft will be more effective and can sdecrease lorazepam to PRN dosing     Smoker Patient aware of need for cessation. I have prescribed Chantix in the past she had adverse effects.  Hyperlipidemia See results of lipid panel under screening labs below. She does have positive cardiac risk factors including family history, hypertension, smoker. However at [place] at that time, patient stated that her diet had been much worse recently and she has gained weight because of this. She stated that she could definitely make diet changes. She is to decrease saturated fats in her diet and we will recheck lipid panel in the future. However she has had no follow-up with me in the past year. She is not fasting today. We will need to reassess this in the near future.   Follow-up office visit 2 months. Follow-up immediately if she thinks that she is having adverse effects with her medication.     She had CPE with  me 04/04/2014. The following is copied from that note:   Visit for preventive health examination  A. Screening Labs:  She came for  fasting labs on 03/31/14. CBC normal CMP T normal TSH normal Lipid panel shows     Total cholesterol 222     Triglyceride 123      HDL 36      LDL 161  B. Pap: She states that she plans to schedule routine follow-up with her gynecologist for breast and pelvic exam.  C. Screening Mammogram: She  will schedule follow-up with her gynecologist to manage this.  D. DEXA/BMD:  Does not need this until closer to age 3. As well she does have gynecologist who will follow this.  E. Colorectal Cancer Screening: She had colonoscopy 11/02/13. She reports that there were 8 polyps and that she is to repeat in 3 years. She reports that this was performed by Dr. Barney Drain.  F. Immunizations:  Influenza: She defers influenza vaccine. Discussed her increased risk giving her smoking but she still defers. Tetanus: She states that she had a tetanus vaccine about 5 years ago. Pneumococcal: She has not had any pneumonia vaccine. She is a smoker she needs Pneumovax 23. She is agreeable to receive this today.    Will not need any other pneumonia vaccine until age 40. Zostavax: Wait until age 63 to discuss.       Marin Olp [place], [place], [facility] 06/21/2015 2:34 PM

## 2015-07-11 ENCOUNTER — Other Ambulatory Visit: Payer: Self-pay | Admitting: Physician Assistant

## 2015-07-11 ENCOUNTER — Other Ambulatory Visit: Payer: Self-pay | Admitting: Family Medicine

## 2015-07-11 DIAGNOSIS — F410 Panic disorder [episodic paroxysmal anxiety] without agoraphobia: Secondary | ICD-10-CM

## 2015-07-11 DIAGNOSIS — F411 Generalized anxiety disorder: Secondary | ICD-10-CM

## 2015-07-11 NOTE — Telephone Encounter (Signed)
Approved. #60+1. 

## 2015-07-11 NOTE — Telephone Encounter (Signed)
LRF 05/10/15 #60 + 1.   LOV 06/21/15  OK refill?

## 2015-07-12 MED ORDER — LORAZEPAM 1 MG PO TABS
ORAL_TABLET | ORAL | Status: DC
Start: 2015-07-12 — End: 2015-09-06

## 2015-07-12 NOTE — Telephone Encounter (Signed)
rx called in

## 2015-09-06 ENCOUNTER — Other Ambulatory Visit: Payer: Self-pay | Admitting: Physician Assistant

## 2015-09-06 DIAGNOSIS — F410 Panic disorder [episodic paroxysmal anxiety] without agoraphobia: Secondary | ICD-10-CM

## 2015-09-06 DIAGNOSIS — F411 Generalized anxiety disorder: Secondary | ICD-10-CM

## 2015-09-06 NOTE — Telephone Encounter (Signed)
Pt is requesting a refill of Lorazepam to be called in to the [facility] on [address] [phone]

## 2015-09-06 NOTE — Telephone Encounter (Signed)
LRF 07/12/15 #60 + 1  LOV 06/21/15  OK refill?

## 2015-09-07 NOTE — Telephone Encounter (Signed)
At [facility] 06/21/2015--dose of Zoloft was increased--plan was for f/u OV 2 months. Call pt to come in for OV.

## 2015-09-07 NOTE — Telephone Encounter (Signed)
May give # 30 + 0.

## 2015-09-07 NOTE — Telephone Encounter (Signed)
Made appt for Thursday.  Says Lorazepam runs out Sunday.  Can she have some to hold her over until appt?

## 2015-09-08 MED ORDER — LORAZEPAM 1 MG PO TABS: ORAL_TABLET | ORAL | Status: DC

## 2015-09-08 NOTE — Telephone Encounter (Signed)
Pt aware of partial refill

## 2015-09-14 ENCOUNTER — Encounter: Payer: Self-pay | Admitting: Physician Assistant

## 2015-09-14 ENCOUNTER — Ambulatory Visit (INDEPENDENT_AMBULATORY_CARE_PROVIDER_SITE_OTHER): Payer: BLUE CROSS/BLUE SHIELD | Admitting: Physician Assistant

## 2015-09-14 VITALS — BP 144/86 | HR 68 | Temp 98.2°F | Resp 18 | Wt 163.0 lb

## 2015-09-14 DIAGNOSIS — F411 Generalized anxiety disorder: Secondary | ICD-10-CM

## 2015-09-14 DIAGNOSIS — F410 Panic disorder [episodic paroxysmal anxiety] without agoraphobia: Secondary | ICD-10-CM | POA: Diagnosis not present

## 2015-09-14 DIAGNOSIS — F17200 Nicotine dependence, unspecified, uncomplicated: Secondary | ICD-10-CM

## 2015-09-14 DIAGNOSIS — Z720 Tobacco use: Secondary | ICD-10-CM | POA: Diagnosis not present

## 2015-09-14 DIAGNOSIS — I10 Essential (primary) hypertension: Secondary | ICD-10-CM | POA: Diagnosis not present

## 2015-09-14 MED ORDER — LOSARTAN POTASSIUM 50 MG PO TABS: 50.0000 mg | ORAL_TABLET | Freq: Every day | ORAL | Status: DC

## 2015-09-14 MED ORDER — SERTRALINE HCL 100 MG PO TABS: 100.0000 mg | ORAL_TABLET | Freq: Every day | ORAL | Status: DC

## 2015-09-14 MED ORDER — LORAZEPAM 0.5 MG PO TABS: 0.5000 mg | ORAL_TABLET | Freq: Two times a day (BID) | ORAL | Status: DC | PRN

## 2015-09-14 NOTE — Progress Notes (Signed)
Patient ID: Danielle Morris MRN: [id number], DOB: April 12, 1956, 60 y.o. Date of Encounter: 09/14/2015,   Chief Complaint: F/U HTN   HPI: 60 y.o. y/o female here for f/u of HTN, Anxiety, Panic Disorders.   OV 05/10/2015: Her last office visit with me was 04/25/2014. Today she states that she "has been stressed out." Says that she was feeling panicked just coming here, just having to come to the doctor's office. Says that also there is stress at home-- her husband just recently started a new job, she is having to watch the grandkids, says that her husband had to take off work to bring her here for her visit today.  Also states that she checks her blood pressure and gets systolic 0000000 or less every time she checks it. I did review her chart and at her visit here 04/2014 she brought in her blood pressure cuff and it was reading the same as our blood pressure cuff here and her home blood pressure cuff is accurate.  In the past, I believe around April 2015, she had reactions to different medications and had to go to the ER because of those medication reactions. She gets teary-eyed today stating that she is just scared to try new medicines because she is afraid of how they're going to affect her.  Also reviewed with her that my office note 09/27/13 her husband was with her at that visit and he really felt that she needed to get on a daily SSRI/SNRI. At that visit she also had reported to me that she had used Zoloft 15 years ago. Discussed this with her today.  AT THAT OV: Added Zoloft 50mg  QD.    OV 06/21/2015:  She states she is taking the Zolft 50mg .  Says she isnt crying every day like she was.  Says she is feeling better.  Having no adverse effects.  Says she has gone to 2 funerals recently, has had a lot of stress.  Says she takes the lorazepam at 1:30 and 8: 30. I asked if she takes this PRN or scheduled. She says she takes it at those times every day "just to make sure nothing happens."    Says recently, even on Zoloft "almost went into a panic attack. " Says "has a lot of stress----keeping grandkids, husband working 12 hour shifts, etc"  At that OV--Increased Zoloft to 100mg  QD   09/14/2015: She states that she is taking the Zoloft 100 mg daily. Says this is working well and is causing no adverse effects. Today she looks much happier and less anxious than usual. Today she says that she also is interested in trying to wean off of the lorazepam and is wondering how to do this. Currently she says that she has been taking this twice daily every day. States that she wakes up at 10:30 AM. Takes her blood pressure pill and then later her Zoloft.Then, aAround 1:30 PM takes the lorazepam. Takes the other lorazepam at 8:30 PM. She is still taking her blood pressure medicine as directed and is having no adverse effects. No other complaints or concerns today.  Review of Systems: Consitutional: No fever, chills, fatigue, night sweats, lymphadenopathy. No significant/unexplained weight changes. Eyes: No visual changes, eye redness, or discharge. ENT/Mouth: No ear pain, sore throat, nasal drainage, or sinus pain. Cardiovascular: No chest pressure,heaviness, tightness or squeezing, even with exertion. No increased shortness of breath or dyspnea on exertion.No palpitations, edema, orthopnea, PND. Respiratory: No cough, hemoptysis, SOB, or wheezing. Gastrointestinal: No  anorexia, dysphagia, reflux, pain, nausea, vomiting, hematemesis, diarrhea, constipation, BRBPR, or melena. Breast: No mass, nodules, bulging, or retraction. No skin changes or inflammation. No nipple discharge. No lymphadenopathy. Genitourinary: No dysuria, hematuria, incontinence, vaginal discharge, pruritis, burning, abnormal bleeding, or pain. Musculoskeletal: No decreased ROM, No joint pain or swelling. No significant pain in neck, back, or extremities. Skin: No rash, pruritis, or concerning lesions. Neurological: No  headache, dizziness, syncope, seizures, tremors, memory loss, coordination problems, or paresthesias. Psychological: No  hallucinations, SI/HI. Endocrine: No polydipsia, polyphagia, polyuria, or known diabetes.No increased fatigue. No palpitations/rapid heart rate. No significant/unexplained weight change. All other systems were reviewed and are otherwise negative.  Past Medical History  Diagnosis Date  . Hypertension   . Panic attack      Past Surgical History  Procedure Laterality Date  . Tonsillectomy      age  75  . Colonoscopy with propofol N/A 11/02/2013    Procedure: COLONOSCOPY WITH PROPOFOL;  Surgeon: Danie Binder, MD;  Location: [facility];  Service: Endoscopy;  Laterality: N/A;  in cecum @ 1002; cecal withdrawal time- minutes  . Hemorrhoid banding N/A 11/02/2013    Procedure: HEMORRHOID BANDING;  Surgeon: Danie Binder, MD;  Location: [facility];  Service: Endoscopy;  Laterality: N/A;  . Polypectomy N/A 11/02/2013    Procedure: POLYPECTOMY;  Surgeon: Danie Binder, MD;  Location: [facility];  Service: Endoscopy;  Laterality: N/A;  sigmoid colon, hepatic flexure, hyperplastic rectal, and rectal polyps  . Hemorrhoid surgery N/A 11/05/2013    Procedure: EXTENSIVE HEMORRHOIDECTOMY ;  Surgeon: Jamesetta So, MD;  Location: [facility];  Service: General;  Laterality: N/A;    Home Meds:  Outpatient Prescriptions Prior to Visit  Medication Sig Dispense Refill  . docusate sodium (STOOL SOFTENER) 100 MG capsule Take 100 mg by mouth 2 (two) times daily. One with breakfast, two at bedtime      . doxylamine, Sleep, (UNISOM) 25 MG tablet Take 25 mg by mouth at bedtime as needed for sleep.       Marland Kitchen LORazepam (ATIVAN) 1 MG tablet Take 1 tablet (1 mg total) by mouth 2 (two) times daily as needed for anxiety.  60 tablet  0  . losartan (COZAAR) 50 MG tablet Take 50 mg by mouth daily.      . polyethylene glycol (MIRALAX / GLYCOLAX) packet Take 17 g by mouth daily.      .       .        No  facility-administered medications prior to visit.    Allergies:  Allergies  Allergen Reactions  . Amlodipine Shortness Of Breath, Anxiety and Palpitations  . Tramadol     Rushes thru body, miserable, shaking and crying  . Hydrocodone Nausea And Vomiting  . Lisinopril Other (See Comments)    unknown  . Chantix [Varenicline] Nausea Only and Anxiety    Social History   Social History  . Marital Status: Married    Spouse Name: N/A  . Number of Children: N/A  . Years of Education: N/A   Occupational History  . Not on file.   Social History Main Topics  . Smoking status: Current Some Day Smoker -- 0.50 packs/day for 10 years    Types: Cigarettes  . Smokeless tobacco: Never Used     Comment: uses E-cigs  . Alcohol Use: No  . Drug Use: No  . Sexual Activity: Not on file   Other Topics Concern  . Not on file  Social History Narrative   Entered 03/2014:   Married.    2 Daughters and son in law, and 1 grandchild live with them now.    Keeps another grandchild frequently also.   Does not work outside of house.    Family History  Problem Relation Age of Onset  . Heart disease Father 87    MI--H/O Rheumatic Fever as a child  . Diabetes Other   . Hypertension Brother   . Hypertension Brother     Physical Exam: Blood pressure 144/86, pulse 68, temperature 98.2 F (36.8 C), temperature source Oral, resp. rate 18, weight 163 lb (73.936 kg)., Body mass index is 29.81 kg/(m^2). General: Well developed, well nourished, WF. Appears in no acute distress. Neck: Supple. Trachea midline. No thyromegaly. Full ROM. No lymphadenopathy.No Carotid Bruits. Lungs: Clear to auscultation bilaterally without wheezes, rales, or rhonchi. Breathing is of normal effort and unlabored. Cardiovascular: RRR with S1 S2. No murmurs, rubs, or gallops. Distal pulses 2+ symmetrically. No carotid or abdominal bruits. Musculoskeletal: Full range of motion and 5/5 strength throughout.  Skin: Warm and  moist. Neuro: A+Ox3. CN II-XII grossly intact. Moves all extremities spontaneously. Full sensation throughout. Normal gait. Psych:  She become slightly teary-eyed talking about her "stress ".  Also becomes slightly teary-eyed talking about her fear of having reactions to medications again. Otherwise she is appropriate.   Assessment/Plan:  60 y.o. y/o female here for  Essential hypertension Blood Pressure stable/controlled. Last BMET was normal 05/10/15. Can wait to recheck lab.   Generalized anxiety disorder Continue Zoloft 100 mg daily. Wean down her lorazepam/Ativan. At visit 09/14/15 printed lower dose of 0.5 mg 1 by mouth twice a day PRN. (had been 1 mg BID) Also discussed with her not taking it twice every single day on a scheduled basis--- but, rather to use as needed. Also discussed with her not to stop the Zoloft that even if she thinks she is feeling good in a few months that it is because of the medicine and that she needs to continue the medicine. Voices understanding and agrees.  Panic disorder Continue Zoloft 100 mg daily. Wean down her lorazepam/Ativan. At visit 09/14/15 printed lower dose of 0.5 mg 1 by mouth twice a day PRN. (had been 1 mg BID) Also discussed with her not taking it twice every single day on a scheduled basis--- but, rather to use as needed. Also discussed with her not to stop the Zoloft that even if she thinks she is feeling good in a few months that it is because of the medicine and that she needs to continue the medicine. Voices understanding and agrees.    Smoker Patient aware of need for cessation. I have prescribed Chantix in the past she had adverse effects.  Hyperlipidemia See results of lipid panel under screening labs below. She does have positive cardiac risk factors including family history, hypertension, smoker. However at [place] at that time, patient stated that her diet had been much worse recently and she has gained weight because of this.  She stated that she could definitely make diet changes. She is to decrease saturated fats in her diet and we will recheck lipid panel in the future. However she has had no follow-up with me in the past year. She is not fasting today. We will need to reassess this in the near future.    If things remain stable, she can wait 6 months for follow-up visit. Follow-up sooner if needed.     She had  CPE with me 04/04/2014. The following is copied from that note:   Visit for preventive health examination  A. Screening Labs:  She came for fasting labs on 03/31/14. CBC normal CMP T normal TSH normal Lipid panel shows     Total cholesterol 222     Triglyceride 123      HDL 36      LDL 161  B. Pap: She states that she plans to schedule routine follow-up with her gynecologist for breast and pelvic exam.  C. Screening Mammogram: She  will schedule follow-up with her gynecologist to manage this.  D. DEXA/BMD:  Does not need this until closer to age 76. As well she does have gynecologist who will follow this.  E. Colorectal Cancer Screening: She had colonoscopy 11/02/13. She reports that there were 8 polyps and that she is to repeat in 3 years. She reports that this was performed by Dr. Barney Drain.  F. Immunizations:  Influenza: She defers influenza vaccine. Discussed her increased risk giving her smoking but she still defers. Tetanus: She states that she had a tetanus vaccine about 5 years ago. Pneumococcal: She has not had any pneumonia vaccine. She is a smoker she needs Pneumovax 23. She is agreeable to receive this today.    Will not need any other pneumonia vaccine until age 47. Zostavax: Wait until age 51 to discuss.       [address] [place], [place], [facility] 09/14/2015 2:40 PM

## 2015-10-24 ENCOUNTER — Other Ambulatory Visit: Payer: Self-pay | Admitting: Physician Assistant

## 2015-10-24 NOTE — Telephone Encounter (Signed)
Ok to refill??  Last office visit/ refill 09/14/2015.

## 2015-10-25 NOTE — Telephone Encounter (Signed)
Approved # 60 + 2 

## 2015-10-26 NOTE — Telephone Encounter (Signed)
rx called in

## 2016-03-18 ENCOUNTER — Other Ambulatory Visit: Payer: Self-pay | Admitting: Physician Assistant

## 2016-03-18 DIAGNOSIS — I10 Essential (primary) hypertension: Secondary | ICD-10-CM

## 2016-03-19 NOTE — Telephone Encounter (Signed)
LRF Ativan 10/26/15 #60 + 2.   LOV 09/14/15  OK refill?

## 2016-03-20 NOTE — Telephone Encounter (Signed)
RX refilled per provider pt  States she will call back later today to sch an appt

## 2016-03-20 NOTE — Telephone Encounter (Signed)
Tell pt we will refill ALL meds for 1 month.  By the end of that month time, needs to come in for OV.  So sorry she is going through this difficult time.

## 2016-03-20 NOTE — Telephone Encounter (Signed)
She is due for OV.  Schedule OV.

## 2016-03-20 NOTE — Telephone Encounter (Signed)
Pt states she has lost her insurance her daughter lost twin babies on 10-11  and is not able to come in  and states she need her B/P med

## 2016-04-04 ENCOUNTER — Ambulatory Visit (INDEPENDENT_AMBULATORY_CARE_PROVIDER_SITE_OTHER): Payer: Self-pay | Admitting: Physician Assistant

## 2016-04-04 ENCOUNTER — Encounter: Payer: Self-pay | Admitting: Physician Assistant

## 2016-04-04 VITALS — BP 142/86 | HR 73 | Temp 97.6°F | Resp 18 | Ht 62.0 in | Wt 174.0 lb

## 2016-04-04 DIAGNOSIS — F410 Panic disorder [episodic paroxysmal anxiety] without agoraphobia: Secondary | ICD-10-CM

## 2016-04-04 DIAGNOSIS — F17200 Nicotine dependence, unspecified, uncomplicated: Secondary | ICD-10-CM

## 2016-04-04 DIAGNOSIS — F411 Generalized anxiety disorder: Secondary | ICD-10-CM

## 2016-04-04 DIAGNOSIS — I10 Essential (primary) hypertension: Secondary | ICD-10-CM

## 2016-04-04 NOTE — Progress Notes (Signed)
Patient ID: LINETTA MANZELLA MRN: [id number], DOB: 03/01/1956, 60 y.o. Date of Encounter: 04/04/2016,   Chief Complaint: F/U HTN   HPI: 60 y.o. y/o female here for f/u of HTN, Anxiety, Panic Disorders.    04/04/2016: She states that she is continuing the Zoloft 100 mg daily. Says this does continue to work well and is causing no adverse effects. Says that she has decreased the lorazepam down to the 0.5 mg dose and down to just one per day for the past month. She is fine with completely stopping this-- she just wasn't sure if it was safe to completely stop it without talking to me. She is taking her blood pressure medication daily. No adverse effects.  Review of Systems: Consitutional: No fever, chills, fatigue, night sweats, lymphadenopathy. No significant/unexplained weight changes. Eyes: No visual changes, eye redness, or discharge. ENT/Mouth: No ear pain, sore throat, nasal drainage, or sinus pain. Cardiovascular: No chest pressure,heaviness, tightness or squeezing, even with exertion. No increased shortness of breath or dyspnea on exertion.No palpitations, edema, orthopnea, PND. Respiratory: No cough, hemoptysis, SOB, or wheezing. Gastrointestinal: No anorexia, dysphagia, reflux, pain, nausea, vomiting, hematemesis, diarrhea, constipation, BRBPR, or melena. Breast: No mass, nodules, bulging, or retraction. No skin changes or inflammation. No nipple discharge. No lymphadenopathy. Genitourinary: No dysuria, hematuria, incontinence, vaginal discharge, pruritis, burning, abnormal bleeding, or pain. Musculoskeletal: No decreased ROM, No joint pain or swelling. No significant pain in neck, back, or extremities. Skin: No rash, pruritis, or concerning lesions. Neurological: No headache, dizziness, syncope, seizures, tremors, memory loss, coordination problems, or paresthesias. Psychological: No  hallucinations, SI/HI. Endocrine: No polydipsia, polyphagia, polyuria, or known diabetes.No  increased fatigue. No palpitations/rapid heart rate. No significant/unexplained weight change. All other systems were reviewed and are otherwise negative.  Past Medical History:  Diagnosis Date  . Hypertension   . Panic attack      Past Surgical History:  Procedure Laterality Date  . COLONOSCOPY WITH PROPOFOL N/A 11/02/2013   Procedure: COLONOSCOPY WITH PROPOFOL;  Surgeon: Danie Binder, MD;  Location: [facility];  Service: Endoscopy;  Laterality: N/A;  in cecum @ 1002; cecal withdrawal time- minutes  . HEMORRHOID BANDING N/A 11/02/2013   Procedure: HEMORRHOID BANDING;  Surgeon: Danie Binder, MD;  Location: [facility];  Service: Endoscopy;  Laterality: N/A;  . HEMORRHOID SURGERY N/A 11/05/2013   Procedure: EXTENSIVE HEMORRHOIDECTOMY ;  Surgeon: Jamesetta So, MD;  Location: [facility];  Service: General;  Laterality: N/A;  . POLYPECTOMY N/A 11/02/2013   Procedure: POLYPECTOMY;  Surgeon: Danie Binder, MD;  Location: [facility];  Service: Endoscopy;  Laterality: N/A;  sigmoid colon, hepatic flexure, hyperplastic rectal, and rectal polyps  . TONSILLECTOMY     age  60    Home Meds:  Outpatient Prescriptions Prior to Visit  Medication Sig Dispense Refill  . docusate sodium (STOOL SOFTENER) 100 MG capsule Take 100 mg by mouth 2 (two) times daily. One with breakfast, two at bedtime      . doxylamine, Sleep, (UNISOM) 25 MG tablet Take 25 mg by mouth at bedtime as needed for sleep.       Marland Kitchen LORazepam (ATIVAN) 1 MG tablet Take 1 tablet (1 mg total) by mouth 2 (two) times daily as needed for anxiety.  60 tablet  0  . losartan (COZAAR) 50 MG tablet Take 50 mg by mouth daily.      . polyethylene glycol (MIRALAX / GLYCOLAX) packet Take 17 g by mouth daily.      Marland Kitchen       Marland Kitchen  No facility-administered medications prior to visit.    Allergies:  Allergies  Allergen Reactions  . Amlodipine Shortness Of Breath, Anxiety and Palpitations  . Tramadol     Rushes thru body, miserable, shaking and crying  .  Hydrocodone Nausea And Vomiting  . Lisinopril Other (See Comments)    unknown  . Chantix [Varenicline] Nausea Only and Anxiety    Social History   Social History  . Marital status: Married    Spouse name: N/A  . Number of children: N/A  . Years of education: N/A   Occupational History  . Not on file.   Social History Main Topics  . Smoking status: Current Some Day Smoker    Packs/day: 0.50    Years: 10.00    Types: Cigarettes  . Smokeless tobacco: Never Used     Comment: uses E-cigs  . Alcohol use No  . Drug use: No  . Sexual activity: Not on file   Other Topics Concern  . Not on file   Social History Narrative   Entered 03/2014:   Married.    2 Daughters and son in law, and 1 grandchild live with them now.    Keeps another grandchild frequently also.   Does not work outside of house.    Family History  Problem Relation Age of Onset  . Heart disease Father 82    MI--H/O Rheumatic Fever as a child  . Diabetes Other   . Hypertension Brother   . Hypertension Brother     Physical Exam: Blood pressure (!) 142/86, pulse 73, temperature 97.6 F (36.4 C), temperature source Oral, resp. rate 18, height 5\' 2"  (1.575 m), weight 174 lb (78.9 kg), SpO2 98 %., Body mass index is 31.83 kg/m. General: Well developed, well nourished, WF. Appears in no acute distress. Neck: Supple. Trachea midline. No thyromegaly. Full ROM. No lymphadenopathy.No Carotid Bruits. Lungs: Clear to auscultation bilaterally without wheezes, rales, or rhonchi. Breathing is of normal effort and unlabored. Cardiovascular: RRR with S1 S2. No murmurs, rubs, or gallops. Distal pulses 2+ symmetrically. No carotid or abdominal bruits. Musculoskeletal: Full range of motion and 5/5 strength throughout.  Skin: Warm and moist. Neuro: A+Ox3. CN II-XII grossly intact. Moves all extremities spontaneously. Full sensation throughout. Normal gait. Psych:  She become slightly teary-eyed talking about her "stress ".   Also becomes slightly teary-eyed talking about her fear of having reactions to medications again. Otherwise she is appropriate.   Assessment/Plan:  60 y.o. y/o female here for  Essential hypertension At every office visit she tells me that her blood pressure has always gone up when she goes to a doctor. Says that her blood pressure is much lower and controlled when she is not at the doctor's office. Continue current medication. Check lab to monitor.   Generalized anxiety disorder Continue Zoloft 100 mg daily. discontinue lorazepam/Ativan.  Panic disorder Continue Zoloft 100 mg daily. Discontinue lorazepam/Ativan.    Smoker Patient aware of need for cessation. I have prescribed Chantix in the past she had adverse effects.  Hyperlipidemia See results of lipid panel under screening labs below. She does have positive cardiac risk factors including family history, hypertension, smoker. However at [place] at that time, patient stated that her diet had been much worse recently and she has gained weight because of this. She stated that she could definitely make diet changes. She is to decrease saturated fats in her diet and we will recheck lipid panel in the future. However she has had no follow-up  with me in the past year. She is not fasting today. We will need to reassess this in the near future. Discussed this at her visit 04/04/16. Told her to make sure to schedule her next appointment early morning and come fasting to that appointment.  At visit 04/04/16 also told her to please schedule her next visit as a complete physical exam. She states that she actually is without insurance currently but is has enrolled for insurance but is not sure when the insurance coverage takes effect. Told her that once she knows the date that the insurance will be effective to schedule a complete physical exam. Come fasting to that appointment. She is agreeable.      Marin Olp [place], [place],  [facility] 04/04/2016 2:54 PM

## 2016-04-05 LAB — BASIC METABOLIC PANEL WITH GFR
BUN: 9 mg/dL (ref 7–25)
CALCIUM: 9.3 mg/dL (ref 8.6–10.4)
CO2: 27 mmol/L (ref 20–31)
Chloride: 101 mmol/L (ref 98–110)
Creat: 0.81 mg/dL (ref 0.50–1.05)
GFR, EST NON AFRICAN AMERICAN: 80 mL/min (ref >=60)
GLUCOSE: 99 mg/dL (ref 70–99)
Potassium: 4.1 mmol/L (ref 3.5–5.3)
Sodium: 136 mmol/L (ref 135–146)

## 2016-04-17 ENCOUNTER — Other Ambulatory Visit: Payer: Self-pay | Admitting: Physician Assistant

## 2016-04-17 DIAGNOSIS — I10 Essential (primary) hypertension: Secondary | ICD-10-CM

## 2016-04-18 NOTE — Telephone Encounter (Signed)
Agree 

## 2016-04-18 NOTE — Telephone Encounter (Signed)
Lorazepam refill denied because it has been discontinued.  Other meds, Sertraline and Losartan, refilled per protocol.

## 2016-10-02 ENCOUNTER — Ambulatory Visit (INDEPENDENT_AMBULATORY_CARE_PROVIDER_SITE_OTHER): Payer: Self-pay | Admitting: Physician Assistant

## 2016-10-02 ENCOUNTER — Encounter: Payer: Self-pay | Admitting: Physician Assistant

## 2016-10-02 VITALS — BP 160/102 | HR 65 | Temp 97.9°F | Resp 18 | Wt 183.4 lb

## 2016-10-02 DIAGNOSIS — F17200 Nicotine dependence, unspecified, uncomplicated: Secondary | ICD-10-CM

## 2016-10-02 DIAGNOSIS — I10 Essential (primary) hypertension: Secondary | ICD-10-CM

## 2016-10-02 DIAGNOSIS — F410 Panic disorder [episodic paroxysmal anxiety] without agoraphobia: Secondary | ICD-10-CM

## 2016-10-02 DIAGNOSIS — F411 Generalized anxiety disorder: Secondary | ICD-10-CM

## 2016-10-02 MED ORDER — LOSARTAN POTASSIUM 100 MG PO TABS: 100.0000 mg | ORAL_TABLET | Freq: Every day | ORAL | 0 refills | Status: DC

## 2016-10-02 MED ORDER — SERTRALINE HCL 100 MG PO TABS: 100.0000 mg | ORAL_TABLET | Freq: Every day | ORAL | 1 refills | Status: DC

## 2016-10-02 NOTE — Progress Notes (Signed)
Patient ID: GENEVIA BOULDIN MRN: [id number], DOB: Apr 21, 1956, 61 y.o. Date of Encounter: 10/02/2016,   Chief Complaint: F/U HTN   HPI: 61 y.o. y/o female here for f/u 61 y.o. y/o female here for f/u of HTN, Anxiety, Panic Disorders.    04/04/2016: She states that she is continuing (61 y.o.) the Zoloft 100 mg daily. Says this does continue to work well and is causing no adverse effects. Says that she has decreased the lorazepam down to the 0.5 mg dose and down to just one per day for the past month. She is fine with completely stopping this-- she just wasn't sure if it was safe to completely stop it without talking to me. She is taking her blood pressure medication daily. No adverse effects.  10/02/2016: She states that she currently is is without insurance. Thought that she was going to be able to get insurance but that did not happen and will have to wait to sign up this upcoming fall. Husband currently has no job and is trying to get disability. Says that she is going to get her teeth taken out/pulled. Says that they have all been really bad for a long time and she kept putting it off but is going to have to get them pulled and has talked to a place about a payment plan. She is taking that losartan 50 mg every day and has not run out of this or been skipping doses. Says that she has checked her blood pressure some at home and at [facility] and usually gets around 037 systolic. She is taking the Zoloft. This is working well. She does not feel depressed and does not feel anxious or panic. It is causing no adverse effects.    Review of Systems: Consitutional: No fever, chills, fatigue, night sweats, lymphadenopathy. No significant/unexplained weight changes. Eyes: No visual changes, eye redness, or discharge. ENT/Mouth: No ear pain, sore throat, nasal drainage, or sinus pain. Cardiovascular: No chest pressure,heaviness, tightness or squeezing, even with exertion. No increased shortness of breath or dyspnea on exertion.No palpitations,  edema, orthopnea, PND. Respiratory: No cough, hemoptysis, SOB, or wheezing. Gastrointestinal: No anorexia, dysphagia, reflux, pain, nausea, vomiting, hematemesis, diarrhea, constipation, BRBPR, or melena. Breast: No mass, nodules, bulging, or retraction. No skin changes or inflammation. No nipple discharge. No lymphadenopathy. Genitourinary: No dysuria, hematuria, incontinence, vaginal discharge, pruritis, burning, abnormal bleeding, or pain. Musculoskeletal: No decreased ROM, No joint pain or swelling. No significant pain in neck, back, or extremities. Skin: No rash, pruritis, or concerning lesions. Neurological: No headache, dizziness, syncope, seizures, tremors, memory loss, coordination problems, or paresthesias. Psychological: No  hallucinations, SI/HI. Endocrine: No polydipsia, polyphagia, polyuria, or known diabetes.No increased fatigue. No palpitations/rapid heart rate. No significant/unexplained weight change. All other systems were reviewed and are otherwise negative.  Past Medical History:  Diagnosis Date  . Hypertension   . Panic attack      Past Surgical History:  Procedure Laterality Date  . COLONOSCOPY WITH PROPOFOL N/A 11/02/2013   Procedure: COLONOSCOPY WITH PROPOFOL;  Surgeon: Danie Binder, MD;  Location: [facility];  Service: Endoscopy;  Laterality: N/A;  in cecum @ 1002; cecal withdrawal time- minutes  . HEMORRHOID BANDING N/A 11/02/2013   Procedure: HEMORRHOID BANDING;  Surgeon: Danie Binder, MD;  Location: [facility];  Service: Endoscopy;  Laterality: N/A;  . HEMORRHOID SURGERY N/A 11/05/2013   Procedure: EXTENSIVE HEMORRHOIDECTOMY ;  Surgeon: Jamesetta So, MD;  Location: [facility];  Service: General;  Laterality: N/A;  . POLYPECTOMY N/A 11/02/2013   Procedure: POLYPECTOMY;  Surgeon:  Danie Binder, MD;  Location: [facility];  Service: Endoscopy;  Laterality: N/A;  sigmoid colon, hepatic flexure, hyperplastic rectal, and rectal polyps  . TONSILLECTOMY     age  61    Home  Meds:  Outpatient Prescriptions Prior to Visit  Medication Sig Dispense Refill  . docusate sodium (STOOL SOFTENER) 100 MG capsule Take 100 mg by mouth 2 (two) times daily. One with breakfast, two at bedtime      . doxylamine, Sleep, (UNISOM) 25 MG tablet Take 25 mg by mouth at bedtime as needed for sleep.       Marland Kitchen LORazepam (ATIVAN) 1 MG tablet Take 1 tablet (1 mg total) by mouth 2 (two) times daily as needed for anxiety.  60 tablet  0  . losartan (COZAAR) 50 MG tablet Take 50 mg by mouth daily.      . polyethylene glycol (MIRALAX / GLYCOLAX) packet Take 17 g by mouth daily.      .       .        No facility-administered medications prior to visit.    Allergies:  Allergies  Allergen Reactions  . Amlodipine Shortness Of Breath, Anxiety and Palpitations  . Tramadol     Rushes thru body, miserable, shaking and crying  . Hydrocodone Nausea And Vomiting  . Lisinopril Other (See Comments)    unknown  . Chantix [Varenicline] Nausea Only and Anxiety    Social History   Social History  . Marital status: Married    Spouse name: N/A  . Number of children: N/A  . Years of education: N/A   Occupational History  . Not on file.   Social History Main Topics  . Smoking status: Current Some Day Smoker    Packs/day: 0.50    Years: 10.00    Types: Cigarettes  . Smokeless tobacco: Never Used     Comment: uses E-cigs  . Alcohol use No  . Drug use: No  . Sexual activity: Not on file   Other Topics Concern  . Not on file   Social History Narrative   Entered 03/2014:   Married.    2 Daughters and son in law, and 1 grandchild live with them now.    Keeps another grandchild frequently also.   Does not work outside of house.    Family History  Problem Relation Age of Onset  . Heart disease Father 17    MI--H/O Rheumatic Fever as a child  . Diabetes Other   . Hypertension Brother   . Hypertension Brother     Physical Exam: Blood pressure (!) 160/102, pulse 65, temperature 97.9  F (36.6 C), temperature source Oral, resp. rate 18, weight 183 lb 6.4 oz (83.2 kg), SpO2 98 %., Body mass index is 33.54 kg/m. General: Well developed, well nourished, WF. Appears in no acute distress. Neck: Supple. Trachea midline. No thyromegaly. Full ROM. No lymphadenopathy.No Carotid Bruits. Lungs: Clear to auscultation bilaterally without wheezes, rales, or rhonchi. Breathing is of normal effort and unlabored. Cardiovascular: RRR with S1 S2. No murmurs, rubs, or gallops. Distal pulses 2+ symmetrically. No carotid or abdominal bruits. Musculoskeletal: Full range of motion and 5/5 strength throughout.  Skin: Warm and moist. Neuro: A+Ox3. CN II-XII grossly intact. Moves all extremities spontaneously. Full sensation throughout. Normal gait. Psych:  She become slightly teary-eyed talking about her "stress ".  Also becomes slightly teary-eyed talking about her fear of having reactions to medications again. Otherwise she is appropriate.   Assessment/Plan:  61 y.o. y/o female here for  Essential hypertension 10/02/2016: I rechecked her blood pressure myself and I'm getting elevated reading also. Discussed increasing dose of medicine and need for return for another office visit and lab work in 2 weeks. She states that she can and will return and is agreeable with this plan. Increase losartan from 50 mg to 100 mg daily. Return for office visit and lab in 2 weeks.  Generalized anxiety disorder 10/02/2016: Stable/controlled. Continue Zoloft 100 mg daily.   Panic disorder 10/02/2016: Stable/controlled.Continue Zoloft 100 mg daily.    Smoker Patient aware of need for cessation. I have prescribed Chantix in the past she had adverse effects.  Hyperlipidemia See results of lipid panel under screening labs below. She does have positive cardiac risk factors including family history, hypertension, smoker. However at [place] at that time, patient stated that her diet had been much worse recently and she  has gained weight because of this. She stated that she could definitely make diet changes. She is to decrease saturated fats in her diet and we will recheck lipid panel in the future. However she has had no follow-up with me in the past year. She is not fasting today. We will need to reassess this in the near future. Discussed this at her visit 04/04/16. Told her to make sure to schedule her next appointment early morning and come fasting to that appointment.  At visit 04/04/16 also told her to please schedule her next visit as a complete physical exam. She states that she actually is without insurance currently but is has enrolled for insurance but is not sure when the insurance coverage takes effect. Told her that once she knows the date that the insurance will be effective to schedule a complete physical exam. Come fasting to that appointment. She is agreeable.  At [place] 10/02/2016---she is without insurance and is having to put off anything she can possibly put off secondary to finances    Signed, Olean Ree [place], [place], [facility] 10/02/2016 2:24 PM

## 2016-10-09 ENCOUNTER — Encounter: Payer: Self-pay | Admitting: Gastroenterology

## 2016-10-16 ENCOUNTER — Encounter: Payer: Self-pay | Admitting: Physician Assistant

## 2016-10-16 ENCOUNTER — Ambulatory Visit (INDEPENDENT_AMBULATORY_CARE_PROVIDER_SITE_OTHER): Payer: Self-pay | Admitting: Physician Assistant

## 2016-10-16 VITALS — BP 134/96 | HR 69 | Temp 98.0°F | Resp 14 | Wt 184.2 lb

## 2016-10-16 DIAGNOSIS — I10 Essential (primary) hypertension: Secondary | ICD-10-CM

## 2016-10-16 NOTE — Progress Notes (Signed)
Patient ID: Danielle Morris MRN: [id number], DOB: 17-Nov-1955, 61 y.o. Date of Encounter: 10/16/2016,   Chief Complaint: F/U HTN   HPI: 61 y.o. y/o female here for f/u of HTN, Anxiety, Panic Disorders.    04/04/2016: She states that she is continuing the Zoloft 100 mg daily. Says this does continue to work well and is causing no adverse effects. Says that she has decreased the lorazepam down to the 0.5 mg dose and down to just one per day for the past month. She is fine with completely stopping this-- she just wasn't sure if it was safe to completely stop it without talking to me. She is taking her blood pressure medication daily. No adverse effects.  10/02/2016: She states that she currently is is without insurance. Thought that she was going to be able to get insurance but that did not happen and will have to wait to sign up this upcoming fall. Husband currently has no job and is trying to get disability. Says that she is going to get her teeth taken out/pulled. Says that they have all been really bad for a long time and she kept putting it off but is going to have to get them pulled and has talked to a place about a payment plan. She is taking that losartan 50 mg every day and has not run out of this or been skipping doses. Says that she has checked her blood pressure some at home and at [facility] and usually gets around 354 systolic. She is taking the Zoloft. This is working well. She does not feel depressed and does not feel anxious or panic. It is causing no adverse effects.   10/16/2016: At [place] 10/02/16-- BP was elevated. Increased Losartan from 50mg  to 100mg  QD.  Today patient reports that she did increase the losartan up to the 100 mg dose. Is taking this daily. Is having no adverse effects. Says that she does have a digital cuff at home and has also checked it at [facility] once. Says that she knows that the top number was 130s but did not pay attention to the bottom number.   Review of  Systems: Consitutional: No fever, chills, fatigue, night sweats, lymphadenopathy. No significant/unexplained weight changes. Eyes: No visual changes, eye redness, or discharge. ENT/Mouth: No ear pain, sore throat, nasal drainage, or sinus pain. Cardiovascular: No chest pressure,heaviness, tightness or squeezing, even with exertion. No increased shortness of breath or dyspnea on exertion.No palpitations, edema, orthopnea, PND. Respiratory: No cough, hemoptysis, SOB, or wheezing. Gastrointestinal: No anorexia, dysphagia, reflux, pain, nausea, vomiting, hematemesis, diarrhea, constipation, BRBPR, or melena. Breast: No mass, nodules, bulging, or retraction. No skin changes or inflammation. No nipple discharge. No lymphadenopathy. Genitourinary: No dysuria, hematuria, incontinence, vaginal discharge, pruritis, burning, abnormal bleeding, or pain. Musculoskeletal: No decreased ROM, No joint pain or swelling. No significant pain in neck, back, or extremities. Skin: No rash, pruritis, or concerning lesions. Neurological: No headache, dizziness, syncope, seizures, tremors, memory loss, coordination problems, or paresthesias. Psychological: No  hallucinations, SI/HI. Endocrine: No polydipsia, polyphagia, polyuria, or known diabetes.No increased fatigue. No palpitations/rapid heart rate. No significant/unexplained weight change. All other systems were reviewed and are otherwise negative.  Past Medical History:  Diagnosis Date  . Hypertension   . Panic attack      Past Surgical History:  Procedure Laterality Date  . COLONOSCOPY WITH PROPOFOL N/A 11/02/2013   Procedure: COLONOSCOPY WITH PROPOFOL;  Surgeon: Danie Binder, MD;  Location: [facility];  Service: Endoscopy;  Laterality: N/A;  in cecum @ 1002; cecal withdrawal time- minutes  . HEMORRHOID BANDING N/A 11/02/2013   Procedure: HEMORRHOID BANDING;  Surgeon: Danie Binder, MD;  Location: [facility];  Service: Endoscopy;  Laterality: N/A;  . HEMORRHOID  SURGERY N/A 11/05/2013   Procedure: EXTENSIVE HEMORRHOIDECTOMY ;  Surgeon: Jamesetta So, MD;  Location: [facility];  Service: General;  Laterality: N/A;  . POLYPECTOMY N/A 11/02/2013   Procedure: POLYPECTOMY;  Surgeon: Danie Binder, MD;  Location: [facility];  Service: Endoscopy;  Laterality: N/A;  sigmoid colon, hepatic flexure, hyperplastic rectal, and rectal polyps  . TONSILLECTOMY     age  33    Home Meds:  Outpatient Prescriptions Prior to Visit  Medication Sig Dispense Refill  . docusate sodium (STOOL SOFTENER) 100 MG capsule Take 100 mg by mouth 2 (two) times daily. One with breakfast, two at bedtime      . doxylamine, Sleep, (UNISOM) 25 MG tablet Take 25 mg by mouth at bedtime as needed for sleep.       Marland Kitchen LORazepam (ATIVAN) 1 MG tablet Take 1 tablet (1 mg total) by mouth 2 (two) times daily as needed for anxiety.  60 tablet  0  . losartan (COZAAR) 50 MG tablet Take 50 mg by mouth daily.      . polyethylene glycol (MIRALAX / GLYCOLAX) packet Take 17 g by mouth daily.      .       .        No facility-administered medications prior to visit.    Allergies:  Allergies  Allergen Reactions  . Amlodipine Shortness Of Breath, Anxiety and Palpitations  . Tramadol     Rushes thru body, miserable, shaking and crying  . Hydrocodone Nausea And Vomiting  . Lisinopril Other (See Comments)    unknown  . Chantix [Varenicline] Nausea Only and Anxiety    Social History   Social History  . Marital status: Married    Spouse name: N/A  . Number of children: N/A  . Years of education: N/A   Occupational History  . Not on file.   Social History Main Topics  . Smoking status: Current Some Day Smoker    Packs/day: 0.50    Years: 10.00    Types: Cigarettes  . Smokeless tobacco: Never Used     Comment: uses E-cigs  . Alcohol use No  . Drug use: No  . Sexual activity: Not on file   Other Topics Concern  . Not on file   Social History Narrative   Entered 03/2014:   Married.    2  Daughters and son in law, and 1 grandchild live with them now.    Keeps another grandchild frequently also.   Does not work outside of house.    Family History  Problem Relation Age of Onset  . Heart disease Father 66    MI--H/O Rheumatic Fever as a child  . Diabetes Other   . Hypertension Brother   . Hypertension Brother     Physical Exam: Blood pressure (!) 134/92, pulse 69, temperature 98 F (36.7 C), temperature source Oral, resp. rate 14, weight 184 lb 3.2 oz (83.6 kg), SpO2 98 %., Body mass index is 33.69 kg/m. General: Well developed, well nourished, WF. Appears in no acute distress. Neck: Supple. Trachea midline. No thyromegaly. Full ROM. No lymphadenopathy.No Carotid Bruits. Lungs: Clear to auscultation bilaterally without wheezes, rales, or rhonchi. Breathing is of normal effort and unlabored. Cardiovascular: RRR with S1 S2.  No murmurs, rubs, or gallops. Distal pulses 2+ symmetrically. No carotid or abdominal bruits. Musculoskeletal: Full range of motion and 5/5 strength throughout.  Skin: Warm and moist. Neuro: A+Ox3. CN II-XII grossly intact. Moves all extremities spontaneously. Full sensation throughout. Normal gait. Psych:  She become slightly teary-eyed talking about her "stress ".  Also becomes slightly teary-eyed talking about her fear of having reactions to medications again. Otherwise she is appropriate.   Assessment/Plan:  61 y.o. y/o female here for  She is currently with no medical insurance  Essential hypertension 10/16/2016: Continue current medication. Losartan 100 mg daily. Check lab to monitor since increased dose.  Routine office visit 6 months or sooner if needed.     --------------The following were addressed at [place] 10/02/2016 but not at [place] 10/16/2016---------------- Generalized anxiety disorder 10/02/2016: Stable/controlled. Continue Zoloft 100 mg daily.   Panic disorder 10/02/2016: Stable/controlled.Continue Zoloft 100 mg  daily.    Smoker Patient aware of need for cessation. I have prescribed Chantix in the past she had adverse effects.  Hyperlipidemia See results of lipid panel under screening labs below. She does have positive cardiac risk factors including family history, hypertension, smoker. However at [place] at that time, patient stated that her diet had been much worse recently and she has gained weight because of this. She stated that she could definitely make diet changes. She is to decrease saturated fats in her diet and we will recheck lipid panel in the future. However she has had no follow-up with me in the past year. She is not fasting today. We will need to reassess this in the near future. Discussed this at her visit 04/04/16. Told her to make sure to schedule her next appointment early morning and come fasting to that appointment.  At visit 04/04/16 also told her to please schedule her next visit as a complete physical exam. She states that she actually is without insurance currently but is has enrolled for insurance but is not sure when the insurance coverage takes effect. Told her that once she knows the date that the insurance will be effective to schedule a complete physical exam. Come fasting to that appointment. She is agreeable.  At [place] 10/02/2016---she is without insurance and is having to put off anything she can possibly put off secondary to finances    Signed, Olean Ree [place], [place], [facility] 10/16/2016 2:14 PM

## 2016-10-17 LAB — BASIC METABOLIC PANEL WITH GFR
BUN: 8 mg/dL (ref 7–25)
CHLORIDE: 103 mmol/L (ref 98–110)
CO2: 26 mmol/L (ref 20–31)
Calcium: 9 mg/dL (ref 8.6–10.4)
Creat: 0.91 mg/dL (ref 0.50–1.05)
GFR, Est African American: 80 mL/min (ref >=60)
GFR, Est Non African American: 70 mL/min (ref >=60)
Glucose, Bld: 118 mg/dL — ABNORMAL HIGH (ref 70–99)
Potassium: 3.8 mmol/L (ref 3.5–5.3)
Sodium: 138 mmol/L (ref 135–146)

## 2016-11-05 ENCOUNTER — Other Ambulatory Visit: Payer: Self-pay

## 2016-11-05 MED ORDER — LOSARTAN POTASSIUM 100 MG PO TABS: 100.0000 mg | ORAL_TABLET | Freq: Every day | ORAL | 2 refills | Status: DC

## 2016-11-05 NOTE — Telephone Encounter (Signed)
Refill appropriate 

## 2017-02-05 ENCOUNTER — Other Ambulatory Visit: Payer: Self-pay | Admitting: Physician Assistant

## 2017-02-05 NOTE — Telephone Encounter (Signed)
Refill appropriate 

## 2017-05-05 ENCOUNTER — Other Ambulatory Visit: Payer: Self-pay | Admitting: Physician Assistant

## 2017-05-05 NOTE — Telephone Encounter (Signed)
rx filled per protocol  

## 2017-06-04 ENCOUNTER — Other Ambulatory Visit: Payer: Self-pay | Admitting: Physician Assistant

## 2017-06-16 ENCOUNTER — Encounter (HOSPITAL_COMMUNITY): Payer: Self-pay | Admitting: Adult Health

## 2017-06-16 ENCOUNTER — Emergency Department (HOSPITAL_COMMUNITY)
Admission: EM | Admit: 2017-06-16 | Discharge: 2017-06-17 | Disposition: A | Discharge status: Home / Self Care | Payer: Self-pay | Attending: Emergency Medicine | Admitting: Emergency Medicine

## 2017-06-16 ENCOUNTER — Other Ambulatory Visit: Payer: Self-pay

## 2017-06-16 DIAGNOSIS — I10 Essential (primary) hypertension: Secondary | ICD-10-CM | POA: Insufficient documentation

## 2017-06-16 DIAGNOSIS — R0789 Other chest pain: Secondary | ICD-10-CM | POA: Insufficient documentation

## 2017-06-16 DIAGNOSIS — Z79899 Other long term (current) drug therapy: Secondary | ICD-10-CM | POA: Insufficient documentation

## 2017-06-16 DIAGNOSIS — F17210 Nicotine dependence, cigarettes, uncomplicated: Secondary | ICD-10-CM | POA: Insufficient documentation

## 2017-06-16 DIAGNOSIS — R252 Cramp and spasm: Secondary | ICD-10-CM | POA: Insufficient documentation

## 2017-06-16 LAB — COMPREHENSIVE METABOLIC PANEL
ALK PHOS: 75 U/L (ref 38–126)
ALT: 21 U/L (ref 14–54)
ANION GAP: 12 (ref 5–15)
AST: 25 U/L (ref 15–41)
Albumin: 4.3 g/dL (ref 3.5–5.0)
BILIRUBIN TOTAL: 0.5 mg/dL (ref 0.3–1.2)
BUN: 11 mg/dL (ref 6–20)
CALCIUM: 9.7 mg/dL (ref 8.9–10.3)
CO2: 23 mmol/L (ref 22–32)
CREATININE: 0.88 mg/dL (ref 0.44–1.00)
Chloride: 101 mmol/L (ref 101–111)
Glucose, Bld: 137 mg/dL — ABNORMAL HIGH (ref 65–99)
Potassium: 4.3 mmol/L (ref 3.5–5.1)
SODIUM: 136 mmol/L (ref 135–145)
TOTAL PROTEIN: 8.7 g/dL — AB (ref 6.5–8.1)

## 2017-06-16 LAB — URINALYSIS, ROUTINE W REFLEX MICROSCOPIC
Bilirubin Urine: NEGATIVE
Glucose, UA: NEGATIVE mg/dL
Hgb urine dipstick: NEGATIVE
Ketones, ur: NEGATIVE mg/dL
Leukocytes, UA: NEGATIVE
NITRITE: NEGATIVE
PROTEIN: NEGATIVE mg/dL
SPECIFIC GRAVITY, URINE: 1.005 (ref 1.005–1.030)
pH: 6 (ref 5.0–8.0)

## 2017-06-16 LAB — LIPASE, BLOOD: Lipase: 46 U/L (ref 11–51)

## 2017-06-16 LAB — CBC
HCT: 46.8 % — ABNORMAL HIGH (ref 36.0–46.0)
HEMOGLOBIN: 15.3 g/dL — AB (ref 12.0–15.0)
MCH: 30 pg (ref 26.0–34.0)
MCHC: 32.7 g/dL (ref 30.0–36.0)
MCV: 91.8 fL (ref 78.0–100.0)
PLATELETS: 298 10*3/uL (ref 150–400)
RBC: 5.1 MIL/uL (ref 3.87–5.11)
RDW: 13.8 % (ref 11.5–15.5)
WBC: 7 10*3/uL (ref 4.0–10.5)

## 2017-06-16 NOTE — ED Provider Notes (Signed)
[facility] EMERGENCY DEPARTMENT Provider Note   CSN: [id number] Arrival date & time: 06/16/17  1906     History   Chief Complaint Chief Complaint  Patient presents with  . Multiple complaints    HPI Danielle Morris is a 62 y.o. female.  HPI  This is a 62 year old female with a history of hypertension and generalized anxiety disorder who presents with whole body cramping.  Patient reports that since [place] she is increasing episodes of cramping all over her body including her bilateral lower extremities, abdomen, arms.  Tonight it got worse.  She noted some chest pain that lasted for several minutes.  Does report a history of hypertension.  No known history of heart disease.  No worsening of cramping at night.  Cramping is worse with standing.  Denies any weakness, numbness, tingling.  Reports chills without documented fevers.  No recent illnesses.  Denies taking supplements.  Reports chest pain tonight resolved with lorazepam.  Past Medical History:  Diagnosis Date  . Hypertension   . Panic attack     Patient Active Problem List   Diagnosis Date Noted  . Generalized anxiety disorder 09/27/2013  . Panic disorder 09/27/2013  . Smoker 08/25/2013  . Hypertension     Past Surgical History:  Procedure Laterality Date  . COLONOSCOPY WITH PROPOFOL N/A 11/02/2013   Procedure: COLONOSCOPY WITH PROPOFOL;  Surgeon: Danie Binder, MD;  Location: [facility];  Service: Endoscopy;  Laterality: N/A;  in cecum @ 1002; cecal withdrawal time- minutes  . HEMORRHOID BANDING N/A 11/02/2013   Procedure: HEMORRHOID BANDING;  Surgeon: Danie Binder, MD;  Location: [facility];  Service: Endoscopy;  Laterality: N/A;  . HEMORRHOID SURGERY N/A 11/05/2013   Procedure: EXTENSIVE HEMORRHOIDECTOMY ;  Surgeon: Jamesetta So, MD;  Location: [facility];  Service: General;  Laterality: N/A;  . POLYPECTOMY N/A 11/02/2013   Procedure: POLYPECTOMY;  Surgeon: Danie Binder, MD;  Location: [facility];  Service: Endoscopy;   Laterality: N/A;  sigmoid colon, hepatic flexure, hyperplastic rectal, and rectal polyps  . TONSILLECTOMY     age  78    OB History    Gravida Para Term Preterm AB Living   3 3 3     3    SAB TAB Ectopic Multiple Live Births                   Home Medications    Prior to Admission medications   Medication Sig Start Date End Date Taking? Authorizing Provider  LORazepam (ATIVAN) 0.5 MG tablet Take 0.5-1 mg by mouth daily.   Yes [provider]  losartan (COZAAR) 100 MG tablet take 1 tablet by mouth once daily 06/05/17  Yes Orlena Sheldon, PA-C    Family History Family History  Problem Relation Age of Onset  . Heart disease Father 27       MI--H/O Rheumatic Fever as a child  . Diabetes Other   . Hypertension Brother   . Hypertension Brother     Social History Social History   Tobacco Use  . Smoking status: Current Some Day Smoker    Packs/day: 0.50    Years: 10.00    Pack years: 5.00    Types: Cigarettes  . Smokeless tobacco: Never Used  . Tobacco comment: uses E-cigs  Substance Use Topics  . Alcohol use: No  . Drug use: No     Allergies   Amlodipine; Tramadol; Hydrocodone; Lisinopril; and Chantix [varenicline]   Review of  Systems Review of Systems  Constitutional: Negative for fever.  Respiratory: Negative for shortness of breath.   Cardiovascular: Positive for chest pain. Negative for leg swelling.  Gastrointestinal: Negative for abdominal pain, nausea and vomiting.  Genitourinary: Negative for dysuria.  Musculoskeletal:       Myalgia  Neurological: Positive for dizziness. Negative for weakness and numbness.  All other systems reviewed and are negative.    Physical Exam Updated Vital Signs BP 129/86   Pulse 77   Temp 97.6 F (36.4 C) (Oral)   Resp 18   SpO2 95%   Physical Exam  Constitutional: She is oriented to person, place, and time. She appears well-developed and well-nourished. No distress.  Anxious appearing, overweight  HENT:   Head: Normocephalic and atraumatic.  Cardiovascular: Normal rate, regular rhythm and normal heart sounds.  No murmur heard. Pulmonary/Chest: Effort normal and breath sounds normal. No respiratory distress. She has no wheezes.  Abdominal: Soft. Bowel sounds are normal. There is no tenderness.  Musculoskeletal: She exhibits no edema or deformity.  Neurological: She is alert and oriented to person, place, and time.  Cranial nerves II through XII intact, 5 out of 5 strength in all 4 extremities, no dysmetria to finger-nose-finger  Skin: Skin is warm and dry.  Psychiatric: She has a normal mood and affect.  Nursing note and vitals reviewed.    ED Treatments / Results  Labs (all labs ordered are listed, but only abnormal results are displayed) Labs Reviewed  COMPREHENSIVE METABOLIC PANEL - Abnormal; Notable for the following components:      Result Value   Glucose, Bld 137 (*)    Total Protein 8.7 (*)    All other components within normal limits  CBC - Abnormal; Notable for the following components:   Hemoglobin 15.3 (*)    HCT 46.8 (*)    All other components within normal limits  LIPASE, BLOOD  URINALYSIS, ROUTINE W REFLEX MICROSCOPIC  CK  MAGNESIUM  TROPONIN I    EKG  EKG Interpretation  Date/Time:  Monday June 16 2017 19:11:22 EST Ventricular Rate:  84 PR Interval:  148 QRS Duration: 82 QT Interval:  402 QTC Calculation: 475 R Axis:   40 Text Interpretation:  Normal sinus rhythm Nonspecific T wave abnormality Prolonged QT Abnormal ECG Confirmed by Thayer Jew [phone]) on 06/16/2017 10:54:57 PM       Radiology No results found.  Procedures Procedures (including critical care time)  Medications Ordered in ED Medications - No data to display   Initial Impression / Assessment and Plan / ED Course  I have reviewed the triage vital signs and the nursing notes.  Pertinent labs & imaging results that were available during my care of the patient were reviewed  by me and considered in my medical decision making (see chart for details).     Presents with acute on chronic whole-body muscle twitching as well as atypical chest pain.  She is overall nontoxic.  Vital signs reassuring.  Exam is fairly benign.  Initial EKG and troponin are negative.  No significant metabolic derangements including normal potassium.  Magnesium and CK were added to workup.  Patient appears very anxious on exam and some of her symptoms do improve with lorazepam.  Question component of anxiety.  Magnesium and CK are reassuring without evidence of metabolic derangement or rhabdo.  Patient could also have an element of restless leg syndrome; however, this would not explain the other body cramps.  Repeat troponin is negative.  Doubt ACS.  Recommend close follow-up with primary physician.  After history, exam, and medical workup I feel the patient has been appropriately medically screened and is safe for discharge home. Pertinent diagnoses were discussed with the patient. Patient was given return precautions.   Final Clinical Impressions(s) / ED Diagnoses   Final diagnoses:  Muscle cramps  Atypical chest pain    ED Discharge Orders    None       Cande Mastropietro, Barbette Hair, MD 06/17/17 0104

## 2017-06-16 NOTE — ED Notes (Signed)
Pt c/o cramps/spasms all over body, has to lay down to help relieve.  Also c/o muscle twitching.

## 2017-06-16 NOTE — ED Triage Notes (Addendum)
Presents with generalized body aches, cramping and tingling all over since January 1st.  Today she woke up at one and went and had coffee and then went to the bathroom and began to feel cramping and tingling in her lower abdomen, then she pushed to try to have a BM and her left leg began to cramp and her left arm got tingling and numb. She took a lorazepam and the tingling went away. SHe states her chest also hurts at times off and on. NEurologically intact. No arm drift, bilateral grips weak, no facial drooop, no slurred speech. She is alert and oriented. She states, "I can feel my blood running all over my body and inside my whole body is shivering. If I move the wrong way I will get one of those cramps"

## 2017-06-16 NOTE — ED Notes (Signed)
Pt did not answer when called for room. Unable to find in lobby

## 2017-06-17 LAB — CK: Total CK: 130 U/L (ref 38–234)

## 2017-06-17 LAB — MAGNESIUM: Magnesium: 2.2 mg/dL (ref 1.7–2.4)

## 2017-06-17 LAB — TROPONIN I

## 2017-06-17 NOTE — ED Notes (Signed)
Pt seen leaving the department, stating she is going to [facility]; pt was up for discharge but stated she was leaving and not waiting for her papers

## 2017-06-17 NOTE — ED Notes (Signed)
Pt walked out angry, did not take paper work or sign. She states she was going to [facility]. She was unsatisfied that we did not find anything wrong at this time.

## 2017-07-04 ENCOUNTER — Other Ambulatory Visit: Payer: Self-pay | Admitting: Physician Assistant

## 2017-07-04 NOTE — Telephone Encounter (Signed)
Refill appropriate 

## 2017-08-04 ENCOUNTER — Other Ambulatory Visit: Payer: Self-pay | Admitting: Physician Assistant

## 2017-08-31 ENCOUNTER — Other Ambulatory Visit: Payer: Self-pay | Admitting: Physician Assistant

## 2017-09-01 NOTE — Telephone Encounter (Signed)
Rx filled per protocol. Patient is due for an office visit. Letter mailed for patient to call and schedule an appointment.

## 2017-09-15 ENCOUNTER — Encounter: Payer: Self-pay | Admitting: Physician Assistant

## 2017-09-15 ENCOUNTER — Other Ambulatory Visit: Payer: Self-pay

## 2017-09-15 ENCOUNTER — Ambulatory Visit: Payer: BLUE CROSS/BLUE SHIELD | Admitting: Physician Assistant

## 2017-09-15 VITALS — BP 148/96 | HR 81 | Temp 97.8°F | Resp 16 | Ht 67.0 in | Wt 187.8 lb

## 2017-09-15 DIAGNOSIS — R531 Weakness: Secondary | ICD-10-CM

## 2017-09-15 DIAGNOSIS — I10 Essential (primary) hypertension: Secondary | ICD-10-CM | POA: Diagnosis not present

## 2017-09-15 DIAGNOSIS — R5383 Other fatigue: Secondary | ICD-10-CM | POA: Diagnosis not present

## 2017-09-15 DIAGNOSIS — F410 Panic disorder [episodic paroxysmal anxiety] without agoraphobia: Secondary | ICD-10-CM

## 2017-09-15 DIAGNOSIS — F411 Generalized anxiety disorder: Secondary | ICD-10-CM | POA: Diagnosis not present

## 2017-09-15 DIAGNOSIS — E785 Hyperlipidemia, unspecified: Secondary | ICD-10-CM

## 2017-09-15 MED ORDER — LOSARTAN POTASSIUM 100 MG PO TABS: 100.0000 mg | ORAL_TABLET | Freq: Every day | ORAL | 5 refills | Status: DC

## 2017-09-15 MED ORDER — SERTRALINE HCL 50 MG PO TABS: 50.0000 mg | ORAL_TABLET | Freq: Every day | ORAL | 1 refills | Status: DC

## 2017-09-15 MED ORDER — LORAZEPAM 0.5 MG PO TABS: 0.5000 mg | ORAL_TABLET | Freq: Two times a day (BID) | ORAL | 0 refills | Status: DC | PRN

## 2017-09-15 NOTE — Progress Notes (Addendum)
Patient ID: ELVY MCLARTY MRN: [id number], DOB: 04/07/1956, 62 y.o. Date of Encounter: 09/15/2017,   Chief Complaint: F/U HTN   HPI: 62 y.o. y/o female here for f/u of HTN, Anxiety, Panic Disorders.    04/04/2016: She states that she is continuing the Zoloft 100 mg daily. Says this does continue to work well and is causing no adverse effects. Says that she has decreased the lorazepam down to the 0.5 mg dose and down to just one per day for the past month. She is fine with completely stopping this-- she just wasn't sure if it was safe to completely stop it without talking to me. She is taking her blood pressure medication daily. No adverse effects.  10/02/2016: She states that she currently is is without insurance. Thought that she was going to be able to get insurance but that did not happen and will have to wait to sign up this upcoming fall. Husband currently has no job and is trying to get disability. Says that she is going to get her teeth taken out/pulled. Says that they have all been really bad for a long time and she kept putting it off but is going to have to get them pulled and has talked to a place about a payment plan. She is taking that losartan 50 mg every day and has not run out of this or been skipping doses. Says that she has checked her blood pressure some at home and at [facility] and usually gets around 053 systolic. She is taking the Zoloft. This is working well. She does not feel depressed and does not feel anxious or panic. It is causing no adverse effects.   10/16/2016: At [place] 10/02/16-- BP was elevated. Increased Losartan from 50mg  to 100mg  QD.  Today patient reports that she did increase the losartan up to the 100 mg dose. Is taking this daily. Is having no adverse effects. Says that she does have a digital cuff at home and has also checked it at [facility] once. Says that she knows that the top number was 130s but did not pay attention to the bottom  number.   09/15/2017: She reports that she just recently got her insurance back and does have insurance coverage now. States that while she was had no insurance, she had to quit the Zoloft secondary to cost.  Also has been out of the lorazepam.   Says that she did pay for her blood pressure medication and did continue to take BP medication.Losartan 100mg  daily. She says that she has not been feeling good.  Says that she "got all of her teeth out and got dentures but they do not fit well so those are "driving her crazy "." Also says that her "daughter is driving her crazy ".  Says that in the past she told me that "her daughter had lost twins" "then had a baby in November".  Says that the daughter and the baby have been living with her.  Says that daughter's husband had left her and was gone for a month but then he recently came back because the baby had gotten sick" and they had taken the baby to the hospital.   However says that "it ended up just being an ear infection and the baby is okay."  However, says that "they are getting on her nerves and driving her crazy."   Says that she "just does not feel good and feels tired weak and anxious".  States that she is fasting  so that we could recheck her cholesterol as well.  This was high in the past.  I did review labs October 2015 showed LDL 161. She has no other specific concerns to address.   Review of Systems: Consitutional: No fever, chills, fatigue, night sweats, lymphadenopathy. No significant/unexplained weight changes. Eyes: No visual changes, eye redness, or discharge. ENT/Mouth: No ear pain, sore throat, nasal drainage, or sinus pain. Cardiovascular: No chest pressure,heaviness, tightness or squeezing, even with exertion. No increased shortness of breath or dyspnea on exertion.No palpitations, edema, orthopnea, PND. Respiratory: No cough, hemoptysis, SOB, or wheezing. Gastrointestinal: No anorexia, dysphagia, reflux, pain, nausea, vomiting,  hematemesis, diarrhea, constipation, BRBPR, or melena. Breast: No mass, nodules, bulging, or retraction. No skin changes or inflammation. No nipple discharge. No lymphadenopathy. Genitourinary: No dysuria, hematuria, incontinence, vaginal discharge, pruritis, burning, abnormal bleeding, or pain. Musculoskeletal: No decreased ROM, No joint pain or swelling. No significant pain in neck, back, or extremities. Skin: No rash, pruritis, or concerning lesions. Neurological: No headache, dizziness, syncope, seizures, tremors, memory loss, coordination problems, or paresthesias. Psychological: No  hallucinations, SI/HI. Endocrine: No polydipsia, polyphagia, polyuria, or known diabetes.No increased fatigue. No palpitations/rapid heart rate. No significant/unexplained weight change. All other systems were reviewed and are otherwise negative.  Past Medical History:  Diagnosis Date  . Hypertension   . Panic attack      Past Surgical History:  Procedure Laterality Date  . COLONOSCOPY WITH PROPOFOL N/A 11/02/2013   Procedure: COLONOSCOPY WITH PROPOFOL;  Surgeon: Danie Binder, MD;  Location: [facility];  Service: Endoscopy;  Laterality: N/A;  in cecum @ 1002; cecal withdrawal time- minutes  . HEMORRHOID BANDING N/A 11/02/2013   Procedure: HEMORRHOID BANDING;  Surgeon: Danie Binder, MD;  Location: [facility];  Service: Endoscopy;  Laterality: N/A;  . HEMORRHOID SURGERY N/A 11/05/2013   Procedure: EXTENSIVE HEMORRHOIDECTOMY ;  Surgeon: Jamesetta So, MD;  Location: [facility];  Service: General;  Laterality: N/A;  . POLYPECTOMY N/A 11/02/2013   Procedure: POLYPECTOMY;  Surgeon: Danie Binder, MD;  Location: [facility];  Service: Endoscopy;  Laterality: N/A;  sigmoid colon, hepatic flexure, hyperplastic rectal, and rectal polyps  . TONSILLECTOMY     age  73    Home Meds:  Outpatient Prescriptions Prior to Visit  Medication Sig Dispense Refill  . docusate sodium (STOOL SOFTENER) 100 MG capsule Take 100 mg by mouth 2  (two) times daily. One with breakfast, two at bedtime      . doxylamine, Sleep, (UNISOM) 25 MG tablet Take 25 mg by mouth at bedtime as needed for sleep.       Marland Kitchen LORazepam (ATIVAN) 1 MG tablet Take 1 tablet (1 mg total) by mouth 2 (two) times daily as needed for anxiety.  60 tablet  0  . losartan (COZAAR) 50 MG tablet Take 50 mg by mouth daily.      . polyethylene glycol (MIRALAX / GLYCOLAX) packet Take 17 g by mouth daily.      .       .        No facility-administered medications prior to visit.    Allergies:  Allergies  Allergen Reactions  . Amlodipine Shortness Of Breath, Anxiety and Palpitations  . Tramadol     Rushes thru body, miserable, shaking and crying  . Hydrocodone Nausea And Vomiting  . Lisinopril Other (See Comments)    unknown  . Chantix [Varenicline] Nausea Only and Anxiety    Social History   Socioeconomic History  .  Marital status: Married    Spouse name: Not on file  . Number of children: Not on file  . Years of education: Not on file  . Highest education level: Not on file  Occupational History  . Not on file  Social Needs  . Financial resource strain: Not on file  . Food insecurity:    Worry: Not on file    Inability: Not on file  . Transportation needs:    Medical: Not on file    Non-medical: Not on file  Tobacco Use  . Smoking status: Current Some Day Smoker    Packs/day: 0.50    Years: 10.00    Pack years: 5.00    Types: Cigarettes  . Smokeless tobacco: Never Used  . Tobacco comment: uses E-cigs  Substance and Sexual Activity  . Alcohol use: No  . Drug use: No  . Sexual activity: Not on file  Lifestyle  . Physical activity:    Days per week: Not on file    Minutes per session: Not on file  . Stress: Not on file  Relationships  . Social connections:    Talks on phone: Not on file    Gets together: Not on file    Attends religious service: Not on file    Active member of club or organization: Not on file    Attends meetings of  clubs or organizations: Not on file    Relationship status: Not on file  . Intimate partner violence:    Fear of current or ex partner: Not on file    Emotionally abused: Not on file    Physically abused: Not on file    Forced sexual activity: Not on file  Other Topics Concern  . Not on file  Social History Narrative   Entered 03/2014:   Married.    2 Daughters and son in law, and 1 grandchild live with them now.    Keeps another grandchild frequently also.   Does not work outside of house.    Family History  Problem Relation Age of Onset  . Heart disease Father 76       MI--H/O Rheumatic Fever as a child  . Diabetes Other   . Hypertension Brother   . Hypertension Brother     Physical Exam: Blood pressure (!) 134/92, pulse 69, temperature 98 F (36.7 C), temperature source Oral, resp. rate 14, weight 184 lb 3.2 oz (83.6 kg), SpO2 98 %., Body mass index is 29.41 kg/m. General: Well developed, well nourished, WF. Appears in no acute distress. Neck: Supple. Trachea midline. No thyromegaly. Full ROM. No lymphadenopathy.No Carotid Bruits. Lungs: Clear to auscultation bilaterally without wheezes, rales, or rhonchi. Breathing is of normal effort and unlabored. Cardiovascular: RRR with S1 S2. No murmurs, rubs, or gallops. Distal pulses 2+ symmetrically. No carotid or abdominal bruits. Musculoskeletal: Full range of motion and 5/5 strength throughout.  Skin: Warm and moist. Neuro: A+Ox3. CN II-XII grossly intact. Moves all extremities spontaneously. Full sensation throughout. Normal gait. Psych:  Mood, affect appropriate through visit today   Assessment/Plan:  62 y.o. y/o female here for   1. Essential hypertension 09/15/2017: Blood Pressure is elevated today but will monitor prior to making med changes.  At this time will refill losartan and check lab to monitor this and will recheck BP at her follow-up visit 6 weeks. - COMPLETE METABOLIC PANEL WITH GFR - losartan (COZAAR) 100 MG  tablet; Take 1 tablet (100 mg total) by mouth daily.  Dispense: 30  tablet; Refill: 5  2. Generalized anxiety disorder 09/15/2017: Will restart the Zoloft at 50 mg daily.  Will give her a refill on the Lorazepam to have available to use as needed. - LORazepam (ATIVAN) 0.5 MG tablet; Take 1-2 tablets (0.5-1 mg total) by mouth 2 (two) times daily as needed for anxiety.  Dispense: 60 tablet; Refill: 0 - sertraline (ZOLOFT) 50 MG tablet; Take 1 tablet (50 mg total) by mouth daily.  Dispense: 30 tablet; Refill: 1  3. Panic disorder 09/15/2017: Will restart the Zoloft at 50 mg daily.  Will give her a refill on the Lorazepam to have available to use as needed. - LORazepam (ATIVAN) 0.5 MG tablet; Take 1-2 tablets (0.5-1 mg total) by mouth 2 (two) times daily as needed for anxiety.  Dispense: 60 tablet; Refill: 0 - sertraline (ZOLOFT) 50 MG tablet; Take 1 tablet (50 mg total) by mouth daily.  Dispense: 30 tablet; Refill: 1  4. Hyperlipidemia, unspecified hyperlipidemia type 09/15/2017: 03/2014 LDL was 161.  She is fasting today to recheck lab. - COMPLETE METABOLIC PANEL WITH GFR - Lipid panel  5. Fatigue, unspecified type 09/15/2017: These symptoms may be secondary to her uncontrolled anxiety but will check lab to evaluate for other causes. - TSH - CBC with Differential/Platelet  6. Weakness 09/15/2017: These symptoms may be secondary to her uncontrolled anxiety but will check lab to evaluate for other causes. - TSH - CBC with Differential/Platelet   Plan f/u OV 6 weeks, sooner if needed.     -------------------------------THE FOLLOWING IS COPIED FROM PRIOR OV NOTES---NOT ADDRESSED AT OV 09/15/2017---WILL F/U THESE AT FUTURE OV--------------------------------------------------------------------  Smoker Patient aware of need for cessation. I have prescribed Chantix in the past she had adverse effects.  Hyperlipidemia See results of lipid panel under screening labs below. She does have positive  cardiac risk factors including family history, hypertension, smoker. However at [place] at that time, patient stated that her diet had been much worse recently and she has gained weight because of this. She stated that she could definitely make diet changes. She is to decrease saturated fats in her diet and we will recheck lipid panel in the future. However she has had no follow-up with me in the past year. She is not fasting today. We will need to reassess this in the near future. Discussed this at her visit 04/04/16. Told her to make sure to schedule her next appointment early morning and come fasting to that appointment.  At visit 04/04/16 also told her to please schedule her next visit as a complete physical exam. She states that she actually is without insurance currently but is has enrolled for insurance but is not sure when the insurance coverage takes effect. Told her that once she knows the date that the insurance will be effective to schedule a complete physical exam. Come fasting to that appointment. She is agreeable.  At [place] 10/02/2016---she is without insurance and is having to put off anything she can possibly put off secondary to finances    Signed, Olean Ree [place], [place], [facility] 09/15/2017 12:23 PM

## 2017-09-16 LAB — CBC WITH DIFFERENTIAL/PLATELET
Basophils Absolute: 38 cells/uL (ref 0–200)
Basophils Relative: 0.6 %
EOS PCT: 3.6 %
Eosinophils Absolute: 227 cells/uL (ref 15–500)
HCT: 45.7 % — ABNORMAL HIGH (ref 35.0–45.0)
Hemoglobin: 15.6 g/dL — ABNORMAL HIGH (ref 11.7–15.5)
Lymphs Abs: 1292 cells/uL (ref 850–3900)
MCH: 29.9 pg (ref 27.0–33.0)
MCHC: 34.1 g/dL (ref 32.0–36.0)
MCV: 87.5 fL (ref 80.0–100.0)
MONOS PCT: 6 %
MPV: 10.6 fL (ref 7.5–12.5)
NEUTROS ABS: 4366 {cells}/uL (ref 1500–7800)
NEUTROS PCT: 69.3 %
PLATELETS: 282 10*3/uL (ref 140–400)
RBC: 5.22 10*6/uL — AB (ref 3.80–5.10)
RDW: 13.1 % (ref 11.0–15.0)
Total Lymphocyte: 20.5 %
WBC mixed population: 378 cells/uL (ref 200–950)
WBC: 6.3 10*3/uL (ref 3.8–10.8)

## 2017-09-16 LAB — COMPLETE METABOLIC PANEL WITH GFR
AG Ratio: 1.4 (calc) (ref 1.0–2.5)
ALBUMIN MSPROF: 4.5 g/dL (ref 3.6–5.1)
ALKALINE PHOSPHATASE (APISO): 82 U/L (ref 33–130)
ALT: 13 U/L (ref 6–29)
AST: 16 U/L (ref 10–35)
BILIRUBIN TOTAL: 0.6 mg/dL (ref 0.2–1.2)
BUN: 9 mg/dL (ref 7–25)
CHLORIDE: 102 mmol/L (ref 98–110)
CO2: 29 mmol/L (ref 20–32)
Calcium: 10 mg/dL (ref 8.6–10.4)
Creat: 0.89 mg/dL (ref 0.50–1.05)
GFR, Est African American: 82 mL/min/{1.73_m2} (ref >=60)
GFR, Est Non African American: 71 mL/min/{1.73_m2} (ref >=60)
GLUCOSE: 110 mg/dL — AB (ref 65–99)
Globulin: 3.2 g/dL (calc) (ref 1.9–3.7)
Potassium: 4.9 mmol/L (ref 3.5–5.3)
Sodium: 138 mmol/L (ref 135–146)
Total Protein: 7.7 g/dL (ref 6.1–8.1)

## 2017-09-16 LAB — LIPID PANEL
Cholesterol: 237 mg/dL — ABNORMAL HIGH (ref <200)
HDL: 35 mg/dL — AB (ref >50)
LDL CHOLESTEROL (CALC): 172 mg/dL — AB
Non-HDL Cholesterol (Calc): 202 mg/dL (calc) — ABNORMAL HIGH (ref <130)
Total CHOL/HDL Ratio: 6.8 (calc) — ABNORMAL HIGH (ref <5.0)
Triglycerides: 154 mg/dL — ABNORMAL HIGH (ref <150)

## 2017-09-16 LAB — TSH: TSH: 1.35 m[IU]/L (ref 0.40–4.50)

## 2017-09-17 ENCOUNTER — Other Ambulatory Visit: Payer: Self-pay

## 2017-09-17 ENCOUNTER — Telehealth: Payer: Self-pay

## 2017-09-17 DIAGNOSIS — E7800 Pure hypercholesterolemia, unspecified: Secondary | ICD-10-CM

## 2017-09-17 MED ORDER — SIMVASTATIN 10 MG PO TABS: 10.0000 mg | ORAL_TABLET | Freq: Every day | ORAL | 1 refills | Status: DC

## 2017-09-17 NOTE — Telephone Encounter (Signed)
Patient states she does not want to add any new medications and she will limit fried foods, saturated fats, dairy products and see what her labs are looking like when she returns for cholesterol recheck

## 2017-09-17 NOTE — Telephone Encounter (Signed)
Call placed to patient she was informed to wait 6 months for cholesterol recheck but to follow up in 6 weeks for b/p recheck. Patient verbalizes understanding

## 2017-09-17 NOTE — Telephone Encounter (Signed)
-----   Message from Orlena Sheldon, PA-C sent at 09/17/2017  7:50 AM EDT ----- Cholesterol is high.  Recommend add simvastatin 10 mg 1 p.o. daily #30+1.  Come fasting to recheck FLP/LFT in 6 weeks. Other labs normal.

## 2017-09-17 NOTE — Telephone Encounter (Signed)
If she is going to treat this with diet changes, would wait to recheck labs in 6 months.   It will take longer amount of time for lab to improve --with treating with diet changes compared to Rx medication.

## 2017-10-15 ENCOUNTER — Other Ambulatory Visit: Payer: Self-pay | Admitting: Physician Assistant

## 2017-10-15 DIAGNOSIS — F411 Generalized anxiety disorder: Secondary | ICD-10-CM

## 2017-10-15 DIAGNOSIS — F410 Panic disorder [episodic paroxysmal anxiety] without agoraphobia: Secondary | ICD-10-CM

## 2017-10-15 NOTE — Telephone Encounter (Signed)
I reviewed her last office note. That visit was on 09/15/2017.  Planned for her to return for follow-up visit 6 weeks. At this time -- she has no f/u appointment scheduled. Have her schedule a follow-up visit for about 2 more weeks from now-- to be 6 weeks after her visit 09/15/2017. Once she has an appointment scheduled then can send in 1 refill on her lorazepam for #60+0.

## 2017-10-15 NOTE — Telephone Encounter (Signed)
Last OV 09/15/2017 Last refill 09/15/2017 Ok to refill?

## 2017-10-16 ENCOUNTER — Telehealth: Payer: Self-pay | Admitting: Physician Assistant

## 2017-10-16 NOTE — Telephone Encounter (Signed)
Per provider if patient had an appointment scheduled for 2 weeks from now she would send in a refill see previous phone note. Refill has been called in to pharmacy patient is aware

## 2017-10-16 NOTE — Telephone Encounter (Signed)
Pt needs refill on ativan to [facility] [address]

## 2017-10-16 NOTE — Telephone Encounter (Signed)
Call placed to patient someone picked up the phone did not say anything waited 2 mins then they hung up. I called back someone answered and said patient was asleep left message for patient to return my call. Will mail letter for patient to call and schedule an appointment

## 2017-10-27 ENCOUNTER — Encounter: Payer: Self-pay | Admitting: Physician Assistant

## 2017-10-27 ENCOUNTER — Other Ambulatory Visit: Payer: Self-pay

## 2017-10-27 ENCOUNTER — Ambulatory Visit: Payer: BLUE CROSS/BLUE SHIELD | Admitting: Physician Assistant

## 2017-10-27 VITALS — BP 118/78 | HR 81 | Temp 97.9°F | Ht 67.0 in | Wt 185.1 lb

## 2017-10-27 DIAGNOSIS — F410 Panic disorder [episodic paroxysmal anxiety] without agoraphobia: Secondary | ICD-10-CM

## 2017-10-27 DIAGNOSIS — I10 Essential (primary) hypertension: Secondary | ICD-10-CM | POA: Diagnosis not present

## 2017-10-27 DIAGNOSIS — F411 Generalized anxiety disorder: Secondary | ICD-10-CM

## 2017-10-27 DIAGNOSIS — F17200 Nicotine dependence, unspecified, uncomplicated: Secondary | ICD-10-CM

## 2017-10-27 DIAGNOSIS — E785 Hyperlipidemia, unspecified: Secondary | ICD-10-CM | POA: Diagnosis not present

## 2017-10-27 NOTE — Progress Notes (Signed)
Patient ID: Danielle Morris MRN: [id number], DOB: May 23, 1956, 62 y.o. Date of Encounter: 10/27/2017,   Chief Complaint: F/U HTN   HPI: 62 y.o. y/o female here for f/u of HTN, Anxiety, Panic Disorders.    04/04/2016: She states that she is continuing the Zoloft 100 mg daily. Says this does continue to work well and is causing no adverse effects. Says that she has decreased the lorazepam down to the 0.5 mg dose and down to just one per day for the past month. She is fine with completely stopping this-- she just wasn't sure if it was safe to completely stop it without talking to me. She is taking her blood pressure medication daily. No adverse effects.  10/02/2016: She states that she currently is is without insurance. Thought that she was going to be able to get insurance but that did not happen and will have to wait to sign up this upcoming fall. Husband currently has no job and is trying to get disability. Says that she is going to get her teeth taken out/pulled. Says that they have all been really bad for a long time and she kept putting it off but is going to have to get them pulled and has talked to a place about a payment plan. She is taking that losartan 50 mg every day and has not run out of this or been skipping doses. Says that she has checked her blood pressure some at home and at [facility] and usually gets around 932 systolic. She is taking the Zoloft. This is working well. She does not feel depressed and does not feel anxious or panic. It is causing no adverse effects.   10/16/2016: At [place] 10/02/16-- BP was elevated. Increased Losartan from 50mg  to 100mg  QD.  Today patient reports that she did increase the losartan up to the 100 mg dose. Is taking this daily. Is having no adverse effects. Says that she does have a digital cuff at home and has also checked it at [facility] once. Says that she knows that the top number was 130s but did not pay attention to the bottom  number.   09/15/2017: She reports that she just recently got her insurance back and does have insurance coverage now. States that while she was had no insurance, she had to quit the Zoloft secondary to cost.  Also has been out of the lorazepam.   Says that she did pay for her blood pressure medication and did continue to take BP medication.Losartan 100mg  daily. She says that she has not been feeling good.  Says that she "got all of her teeth out and got dentures but they do not fit well so those are "driving her crazy "." Also says that her "daughter is driving her crazy ".  Says that in the past she told me that "her daughter had lost twins" "then had a baby in November".  Says that the daughter and the baby have been living with her.  Says that daughter's husband had left her and was gone for a month but then he recently came back because the baby had gotten sick" and they had taken the baby to the hospital.   However says that "it ended up just being an ear infection and the baby is okay."  However, says that "they are getting on her nerves and driving her crazy."   Says that she "just does not feel good and feels tired weak and anxious".  States that she is fasting  so that we could recheck her cholesterol as well.  This was high in the past.  I did review labs October 2015 showed LDL 161. She has no other specific concerns to address.  10/27/2017: Today she reports that she is taking the Zoloft 50 mg daily in addition to using the Klonopin as needed.  Dates that she feels so much better, feels a lot better.  States that she wants to continue the current lower dose of Zoloft.  Feels that this dose is working well for her.  States that this is controlling her anxiety and panic. She continues to take her blood pressure medication daily.  Causing no lightheadedness or other adverse effects. Reviewed that labs checked at last visit 09/15/2017 showed hyperlipidemia with LDL 172 and I recommended that she  start simvastatin 10 mg.  Today she reports that she decided to treat this with diet changes and recheck in 6 months prior to starting prescription medication.  Dates that she had also gained weight and that she was not eating well because she felt depressed etc. but now that she is feeling better plans to improve her diet and really wants to try to control her cholesterol with this rather than medication if possible.  She has no other concerns to address today.     Review of Systems: Consitutional: No fever, chills, fatigue, night sweats, lymphadenopathy. No significant/unexplained weight changes. Eyes: No visual changes, eye redness, or discharge. ENT/Mouth: No ear pain, sore throat, nasal drainage, or sinus pain. Cardiovascular: No chest pressure,heaviness, tightness or squeezing, even with exertion. No increased shortness of breath or dyspnea on exertion.No palpitations, edema, orthopnea, PND. Respiratory: No cough, hemoptysis, SOB, or wheezing. Gastrointestinal: No anorexia, dysphagia, reflux, pain, nausea, vomiting, hematemesis, diarrhea, constipation, BRBPR, or melena. Breast: No mass, nodules, bulging, or retraction. No skin changes or inflammation. No nipple discharge. No lymphadenopathy. Genitourinary: No dysuria, hematuria, incontinence, vaginal discharge, pruritis, burning, abnormal bleeding, or pain. Musculoskeletal: No decreased ROM, No joint pain or swelling. No significant pain in neck, back, or extremities. Skin: No rash, pruritis, or concerning lesions. Neurological: No headache, dizziness, syncope, seizures, tremors, memory loss, coordination problems, or paresthesias. Psychological: No  hallucinations, SI/HI. Endocrine: No polydipsia, polyphagia, polyuria, or known diabetes.No increased fatigue. No palpitations/rapid heart rate. No significant/unexplained weight change. All other systems were reviewed and are otherwise negative.  Past Medical History:  Diagnosis Date  .  Hypertension   . Panic attack      Past Surgical History:  Procedure Laterality Date  . COLONOSCOPY WITH PROPOFOL N/A 11/02/2013   Procedure: COLONOSCOPY WITH PROPOFOL;  Surgeon: Danie Binder, MD;  Location: [facility];  Service: Endoscopy;  Laterality: N/A;  in cecum @ 1002; cecal withdrawal time- minutes  . HEMORRHOID BANDING N/A 11/02/2013   Procedure: HEMORRHOID BANDING;  Surgeon: Danie Binder, MD;  Location: [facility];  Service: Endoscopy;  Laterality: N/A;  . HEMORRHOID SURGERY N/A 11/05/2013   Procedure: EXTENSIVE HEMORRHOIDECTOMY ;  Surgeon: Jamesetta So, MD;  Location: [facility];  Service: General;  Laterality: N/A;  . POLYPECTOMY N/A 11/02/2013   Procedure: POLYPECTOMY;  Surgeon: Danie Binder, MD;  Location: [facility];  Service: Endoscopy;  Laterality: N/A;  sigmoid colon, hepatic flexure, hyperplastic rectal, and rectal polyps  . TONSILLECTOMY     age  43    Home Meds:  Outpatient Prescriptions Prior to Visit  Medication Sig Dispense Refill  . docusate sodium (STOOL SOFTENER) 100 MG capsule Take 100 mg by mouth 2 (two)  times daily. One with breakfast, two at bedtime      . doxylamine, Sleep, (UNISOM) 25 MG tablet Take 25 mg by mouth at bedtime as needed for sleep.       Marland Kitchen LORazepam (ATIVAN) 1 MG tablet Take 1 tablet (1 mg total) by mouth 2 (two) times daily as needed for anxiety.  60 tablet  0  . losartan (COZAAR) 50 MG tablet Take 50 mg by mouth daily.      . polyethylene glycol (MIRALAX / GLYCOLAX) packet Take 17 g by mouth daily.      .       .        No facility-administered medications prior to visit.    Allergies:  Allergies  Allergen Reactions  . Amlodipine Shortness Of Breath, Anxiety and Palpitations  . Tramadol     Rushes thru body, miserable, shaking and crying  . Hydrocodone Nausea And Vomiting  . Lisinopril Other (See Comments)    unknown  . Chantix [Varenicline] Nausea Only and Anxiety    Social History   Socioeconomic History  . Marital status: Married     Spouse name: Not on file  . Number of children: Not on file  . Years of education: Not on file  . Highest education level: Not on file  Occupational History  . Not on file  Social Needs  . Financial resource strain: Not on file  . Food insecurity:    Worry: Not on file    Inability: Not on file  . Transportation needs:    Medical: Not on file    Non-medical: Not on file  Tobacco Use  . Smoking status: Current Some Day Smoker    Packs/day: 0.50    Years: 10.00    Pack years: 5.00    Types: Cigarettes  . Smokeless tobacco: Never Used  . Tobacco comment: uses E-cigs  Substance and Sexual Activity  . Alcohol use: No  . Drug use: No  . Sexual activity: Not on file  Lifestyle  . Physical activity:    Days per week: Not on file    Minutes per session: Not on file  . Stress: Not on file  Relationships  . Social connections:    Talks on phone: Not on file    Gets together: Not on file    Attends religious service: Not on file    Active member of club or organization: Not on file    Attends meetings of clubs or organizations: Not on file    Relationship status: Not on file  . Intimate partner violence:    Fear of current or ex partner: Not on file    Emotionally abused: Not on file    Physically abused: Not on file    Forced sexual activity: Not on file  Other Topics Concern  . Not on file  Social History Narrative   Entered 03/2014:   Married.    2 Daughters and son in law, and 1 grandchild live with them now.    Keeps another grandchild frequently also.   Does not work outside of house.    Family History  Problem Relation Age of Onset  . Heart disease Father 84       MI--H/O Rheumatic Fever as a child  . Diabetes Other   . Hypertension Brother   . Hypertension Brother     Physical Exam: Blood pressure 118/78, pulse 81, temperature 97.9 F (36.6 C), temperature source Oral, height 5\' 7"  (1.702 m),  weight 84 kg (185 lb 2 oz), SpO2 96 %., Body mass index is  28.99 kg/m. General: WF. Appears in no acute distress. Neck: Supple. No thyromegaly. No lymphadenopathy. No carotid bruits.  Lungs: Clear bilaterally to auscultation without wheezes, rales, or rhonchi. Breathing is unlabored. Heart: RRR with S1 S2. No murmurs, rubs, or gallops. Abdomen: Soft, non-tender, non-distended with normoactive bowel sounds. No hepatomegaly. No rebound/guarding. No obvious abdominal masses. Musculoskeletal:  Strength and tone normal for age. Extremities/Skin: Warm and dry. No LE edema.  Neuro: Alert and oriented X 3. Moves all extremities spontaneously. Gait is normal. CNII-XII grossly in tact. Psych:  Responds to questions appropriately with a normal affect.   Assessment/Plan:  62 y.o. y/o female here for  1. Essential hypertension 10/27/2017: Blood Pressure is at goal/controlled.  Lab was stable 09/15/2017.  Continue current medication.  Follow-up visit to follow this up 6 months.  2. Smoker 10/27/2017: Have discussed need for cessation multiple times but she always feels that she has too much stress to attempt cessation and this is the case again today.  We will follow-up again at future visit. Patient aware of need for cessation. I have prescribed Chantix in the past she had adverse effects.   3. Generalized anxiety disorder 10/27/2017: Stable/controlled with Zoloft 50 mg daily and Klonopin as needed.   4. Panic disorder 10/27/2017: Stable/controlled with Zoloft 50 mg daily and Klonopin as needed.   5. Hyperlipidemia, unspecified hyperlipidemia type 10/27/2017: Panel checked 09/15/2017 showed LDL 172.  Recommended simvastatin 10 mg.  Patient wants to improve her diet and recheck 6 months.    10/27/2017: Discussed having her schedule a CPE to update preventive care.  However she defers.  Plan for follow-up routine visit 6 months.  Come fasting to recheck lipid panel at that visit.  Follow-up sooner if needed.     [address] [place], [place],  [facility] 10/27/2017 3:34 PM

## 2017-11-06 ENCOUNTER — Other Ambulatory Visit: Payer: Self-pay | Admitting: Physician Assistant

## 2017-11-06 DIAGNOSIS — F410 Panic disorder [episodic paroxysmal anxiety] without agoraphobia: Secondary | ICD-10-CM

## 2017-11-06 DIAGNOSIS — F411 Generalized anxiety disorder: Secondary | ICD-10-CM

## 2017-11-13 ENCOUNTER — Other Ambulatory Visit: Payer: Self-pay | Admitting: Physician Assistant

## 2017-11-13 DIAGNOSIS — F411 Generalized anxiety disorder: Secondary | ICD-10-CM

## 2017-11-13 DIAGNOSIS — F410 Panic disorder [episodic paroxysmal anxiety] without agoraphobia: Secondary | ICD-10-CM

## 2017-11-13 NOTE — Telephone Encounter (Signed)
Last Ov 10/27/2017 Last refill  09/15/2017 Ok to refill?

## 2017-12-05 ENCOUNTER — Other Ambulatory Visit: Payer: Self-pay | Admitting: Physician Assistant

## 2017-12-05 DIAGNOSIS — F410 Panic disorder [episodic paroxysmal anxiety] without agoraphobia: Secondary | ICD-10-CM

## 2017-12-05 DIAGNOSIS — F411 Generalized anxiety disorder: Secondary | ICD-10-CM

## 2017-12-17 ENCOUNTER — Other Ambulatory Visit: Payer: Self-pay | Admitting: Family Medicine

## 2017-12-17 DIAGNOSIS — F411 Generalized anxiety disorder: Secondary | ICD-10-CM

## 2017-12-17 DIAGNOSIS — F410 Panic disorder [episodic paroxysmal anxiety] without agoraphobia: Secondary | ICD-10-CM

## 2017-12-17 NOTE — Telephone Encounter (Signed)
+  Ok to refill??  Last office visit 10/27/2017.  Last refill 11/13/2017.

## 2018-01-09 ENCOUNTER — Other Ambulatory Visit: Payer: Self-pay | Admitting: Physician Assistant

## 2018-01-09 DIAGNOSIS — F410 Panic disorder [episodic paroxysmal anxiety] without agoraphobia: Secondary | ICD-10-CM

## 2018-01-09 DIAGNOSIS — F411 Generalized anxiety disorder: Secondary | ICD-10-CM

## 2018-03-19 ENCOUNTER — Other Ambulatory Visit: Payer: Self-pay | Admitting: Physician Assistant

## 2018-03-19 DIAGNOSIS — F411 Generalized anxiety disorder: Secondary | ICD-10-CM

## 2018-03-19 DIAGNOSIS — F410 Panic disorder [episodic paroxysmal anxiety] without agoraphobia: Secondary | ICD-10-CM

## 2018-03-20 NOTE — Telephone Encounter (Signed)
Last OV 10/27/2017 Last refill 12/17/2017 Ok to refill?

## 2018-03-25 ENCOUNTER — Other Ambulatory Visit: Payer: Self-pay | Admitting: Physician Assistant

## 2018-03-25 DIAGNOSIS — I10 Essential (primary) hypertension: Secondary | ICD-10-CM

## 2018-04-18 ENCOUNTER — Other Ambulatory Visit: Payer: Self-pay | Admitting: Physician Assistant

## 2018-04-18 DIAGNOSIS — F410 Panic disorder [episodic paroxysmal anxiety] without agoraphobia: Secondary | ICD-10-CM

## 2018-04-18 DIAGNOSIS — F411 Generalized anxiety disorder: Secondary | ICD-10-CM

## 2018-04-20 ENCOUNTER — Other Ambulatory Visit: Payer: Self-pay | Admitting: Physician Assistant

## 2018-04-20 DIAGNOSIS — F411 Generalized anxiety disorder: Secondary | ICD-10-CM

## 2018-04-20 DIAGNOSIS — F410 Panic disorder [episodic paroxysmal anxiety] without agoraphobia: Secondary | ICD-10-CM

## 2018-04-21 NOTE — Telephone Encounter (Signed)
Pt's PCP is unfortunately not practicing here, will discuss with pt at upcoming office visit, as a personal prescribing policy, I do not prescribed chronic controlled substances.  Pt will need to go to psychiatry for better anxiety control, she can switch to another provider at this office, or she can taper down ativan.   She has upcoming visit.  Will discuss.  Lower dose prescribed to prevent withdrawal.  Psychiatry referral entered.

## 2018-04-23 ENCOUNTER — Other Ambulatory Visit: Payer: Self-pay

## 2018-04-23 ENCOUNTER — Ambulatory Visit: Payer: BLUE CROSS/BLUE SHIELD | Admitting: Family Medicine

## 2018-04-23 ENCOUNTER — Ambulatory Visit: Payer: BLUE CROSS/BLUE SHIELD | Admitting: Physician Assistant

## 2018-04-23 ENCOUNTER — Encounter: Payer: Self-pay | Admitting: Family Medicine

## 2018-04-23 VITALS — BP 132/82 | HR 70 | Temp 98.5°F | Resp 14 | Ht 67.0 in | Wt 187.0 lb

## 2018-04-23 DIAGNOSIS — F17200 Nicotine dependence, unspecified, uncomplicated: Secondary | ICD-10-CM

## 2018-04-23 DIAGNOSIS — F410 Panic disorder [episodic paroxysmal anxiety] without agoraphobia: Secondary | ICD-10-CM | POA: Diagnosis not present

## 2018-04-23 DIAGNOSIS — F411 Generalized anxiety disorder: Secondary | ICD-10-CM

## 2018-04-23 DIAGNOSIS — I10 Essential (primary) hypertension: Secondary | ICD-10-CM | POA: Diagnosis not present

## 2018-04-23 DIAGNOSIS — E119 Type 2 diabetes mellitus without complications: Secondary | ICD-10-CM

## 2018-04-23 DIAGNOSIS — E785 Hyperlipidemia, unspecified: Secondary | ICD-10-CM

## 2018-04-23 NOTE — Assessment & Plan Note (Signed)
No desire to quit currently, too much anxiety

## 2018-04-23 NOTE — Assessment & Plan Note (Addendum)
Hx of HLD, refuses to take any meds, has been working on lifestyle changes per pt, but not loosing weight. Recheck FLP today  Labs worse  The 10-year ASCVD risk score [name], et al., 2013) is: 28.5%   Values used to calculate the score:     Age: 62 years     Sex: Female     Is Non-Hispanic African American: No     Diabetic: Yes     Tobacco smoker: Yes     Systolic Blood Pressure: 627 mmHg     Is BP treated: Yes     HDL Cholesterol: 33 mg/dL     Total Cholesterol: 249 mg/dL  Extremely high risk - meds rx'd for her, encouraged her to come back for OV to discuss more.
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
## 2018-04-23 NOTE — Assessment & Plan Note (Signed)
See GAD

## 2018-04-23 NOTE — Assessment & Plan Note (Signed)
Continue zoloft 50 mg daily Gradually taper down ativan to occassional use Pt refuses trial of other meds for anxiety, refuses dose increase of zoloft to 75 mg daily. Psychiatry referral entered but suspect pt will likely refuse to go.

## 2018-04-23 NOTE — Assessment & Plan Note (Signed)
Well controlled on current meds, no SE, recheck CMP

## 2018-04-23 NOTE — Patient Instructions (Addendum)
Will call you with lab results in the next 2-3 business days.  This is what I wrote in response to med request: Regarding chronic benzo's (and other controlled substances)  "Pt's PCP is unfortunately not practicing here, will discuss with pt at upcoming office visit, as a personal prescribing policy, I do not prescribed chronic controlled substances. Pt will need to go to psychiatry for better anxiety control, she can switch to another provider at this office, or she can taper down ativan.  She has upcoming visit. Will discuss. Lower dose prescribed to prevent withdrawal. Psychiatry referral entered. "     You are more than welcome to see Dr. Dennard Schaumann or Dr. Buelah Manis if you can get on their schedule, they may continue your meds as they were, but I personally will not.  It is dangerous, there is high liability for me as a clinician, there is risk of dependency, sedation, over dose.   There are dozens of other medicines that treat anxiety that are SAFE and effective and limit the frequency and use of benzo's, because they are still needed sometimes.  You may need dose adjustments or change of medications to get better control if you are dependent on ativan.   Additionally -  generalized anxiety and panic attacks are best treated with daily controlling meds AND talk therapy/cognitive behavioral therapy.  [place] in [facility]  Intensive Outpatient Programs: [facility]      [place]. [place], [place] Both a day and evening program       [facility] Outpatient     [address]        [place], [place] [address] [phone]         ADS: [facility] [place]: [phone] or [phone] [address], [place] [address] [email or web address]   Substance  Abuse Resources: - Alcohol and Drug Services  [phone] - [facility] [phone] - [facility] [place] [phone] - Residential & Outpatient Substance Abuse Program  [phone]  Psychological Services: - [place]  [place]  [place], [phone] [place]. [address], [place], [place]: [phone] or [phone], [email or web address]  Mobile Crisis Teams:                                        [facility]         Mobile Crisis Care Unit [phone]             [facility] [address] Lady Gary [place] [address] [place] [phone]  Self-Help/Support Groups: [place] of [employer] of support groups [phone] (call for more info)  Narcotics Anonymous (NA) Caring [facility] [address] [place] - 2 meetings at this location  Residential Treatment Programs:  [place] [address] [place], [place]  [address] [place]  [address] [place], [place] [address] [phone] Admissions: 8am-3pm M-F  Incentives Substance [place]     [address] [place], [place] [address]       [phone]         [facility] [place] [address] Jadene Pierini [place], [place]  [facility] [address] [place], [place]  [facility] - Intensive Outpatient      [address] [place] [address]     [place], [place]         [facility] ([place]     [place], [place] or  [phone]  Residential Treatment Services (RTS), Medicaid [place], [place]  [facility] [address]                                               [address] [place]  [facility] Resources: [phone]               General Therapy                                                Domenic Schwab, PhD        [address] [place], [place] [address]         [place]   [address] [place], [place] [address] [phone]  [facility] Recovery [address] [place], [place] [address] [phone] Insurance/Medicaid/sponsorship through [facility] and Families                                              [address] [place]                                        [place], [place] [address]    Therapy/tele-psych/case         [place] [address], [place]  [address]  Adolescent/group home/case management [phone]                                           Rosette Reveal PhD       General therapy       Insurance   [phone]         Dr. Adele Schilder, Insurance, M-F  [facility] Dept. [address] [place] Phone:  [phone]                                  Phone:  [phone]                   Phone:  [place], [place] in [place], [address]             [phone], Insurance

## 2018-04-24 LAB — COMPLETE METABOLIC PANEL WITH GFR
AG Ratio: 1.2 (calc) (ref 1.0–2.5)
ALKALINE PHOSPHATASE (APISO): 90 U/L (ref 33–130)
ALT: 15 U/L (ref 6–29)
AST: 17 U/L (ref 10–35)
Albumin: 4.3 g/dL (ref 3.6–5.1)
BUN: 10 mg/dL (ref 7–25)
CALCIUM: 9.5 mg/dL (ref 8.6–10.4)
CO2: 27 mmol/L (ref 20–32)
CREATININE: 0.83 mg/dL (ref 0.50–0.99)
Chloride: 100 mmol/L (ref 98–110)
GFR, EST NON AFRICAN AMERICAN: 77 mL/min/{1.73_m2} (ref >=60)
GFR, Est African American: 89 mL/min/{1.73_m2} (ref >=60)
GLOBULIN: 3.6 g/dL (ref 1.9–3.7)
GLUCOSE: 103 mg/dL — AB (ref 65–99)
Potassium: 4.4 mmol/L (ref 3.5–5.3)
SODIUM: 136 mmol/L (ref 135–146)
Total Bilirubin: 0.7 mg/dL (ref 0.2–1.2)
Total Protein: 7.9 g/dL (ref 6.1–8.1)

## 2018-04-24 LAB — CBC WITH DIFFERENTIAL/PLATELET
BASOS ABS: 50 {cells}/uL (ref 0–200)
Basophils Relative: 0.9 %
EOS ABS: 218 {cells}/uL (ref 15–500)
EOS PCT: 3.9 %
HEMATOCRIT: 45.6 % — AB (ref 35.0–45.0)
HEMOGLOBIN: 15.9 g/dL — AB (ref 11.7–15.5)
LYMPHS ABS: 1540 {cells}/uL (ref 850–3900)
MCH: 31 pg (ref 27.0–33.0)
MCHC: 34.9 g/dL (ref 32.0–36.0)
MCV: 88.9 fL (ref 80.0–100.0)
MPV: 10.4 fL (ref 7.5–12.5)
Monocytes Relative: 6.2 %
NEUTROS ABS: 3444 {cells}/uL (ref 1500–7800)
NEUTROS PCT: 61.5 %
Platelets: 262 10*3/uL (ref 140–400)
RBC: 5.13 10*6/uL — ABNORMAL HIGH (ref 3.80–5.10)
RDW: 12.8 % (ref 11.0–15.0)
Total Lymphocyte: 27.5 %
WBC mixed population: 347 cells/uL (ref 200–950)
WBC: 5.6 10*3/uL (ref 3.8–10.8)

## 2018-04-24 LAB — LIPID PANEL
Cholesterol: 249 mg/dL — ABNORMAL HIGH (ref <200)
HDL: 33 mg/dL — ABNORMAL LOW (ref >50)
LDL Cholesterol (Calc): 180 mg/dL (calc) — ABNORMAL HIGH
Non-HDL Cholesterol (Calc): 216 mg/dL (calc) — ABNORMAL HIGH (ref <130)
Total CHOL/HDL Ratio: 7.5 (calc) — ABNORMAL HIGH (ref <5.0)
Triglycerides: 198 mg/dL — ABNORMAL HIGH (ref <150)

## 2018-04-26 ENCOUNTER — Other Ambulatory Visit: Payer: Self-pay | Admitting: Physician Assistant

## 2018-04-26 DIAGNOSIS — I10 Essential (primary) hypertension: Secondary | ICD-10-CM

## 2018-04-28 DIAGNOSIS — E119 Type 2 diabetes mellitus without complications: Secondary | ICD-10-CM | POA: Insufficient documentation

## 2018-04-28 MED ORDER — ATORVASTATIN CALCIUM 40 MG PO TABS: 40.0000 mg | ORAL_TABLET | Freq: Every day | ORAL | 3 refills | Status: DC

## 2018-04-28 MED ORDER — METFORMIN HCL 500 MG PO TABS: 500.0000 mg | ORAL_TABLET | Freq: Two times a day (BID) | ORAL | 3 refills | Status: DC

## 2018-04-28 NOTE — Addendum Note (Signed)
Addended by: Delsa Grana on: 04/28/2018 07:32 PM   Modules accepted: Orders

## 2018-04-28 NOTE — Progress Notes (Signed)
Please notify pt of labs  RBC's are concentrated - too many, need to try and drink more and cutting back on smoking can help this.  It can put her at higher risk of blood clots, Mi/strokes  She has worsening blood sugar and A1C - consistent with diabetes.  This needs medications to treat.  Also her cholesterol is much worse and also needs medicines to treat.    With her Bp, sugars, cholesterol and smoking, she has about 1/5 chance of having heart event or stroke in the next 10 years.    Can you please ask her if there is anything we can do to help her feel more comfortable to be able to take new meds.  After starting meds will need labs to be rechecked within 1 month and then in 3 months   Thank you  Metformin and statin sent to pharmacy

## 2018-04-28 NOTE — Assessment & Plan Note (Signed)
Start metformin 500 mg BID Recheck labs in 3 months Needs to come back to office to discuss (but I suspect she won't come and I doubt she will take meds)  Needs eye exam, foot exam, statin Very high CVD risk

## 2018-05-06 NOTE — Addendum Note (Signed)
Addended by: Delsa Grana on: 05/06/2018 12:56 PM   Modules accepted: Orders

## 2018-05-06 NOTE — Progress Notes (Signed)
Thank you for checking on this.  Pt will need to decrease calories, simple carbs/sugars and saturated fat and transfat and try to exercise to improve sugar levels (Dm) and cholesterol.  Recheck labs in 3-4 months if only doing lifestyle/diet changes.

## 2018-05-18 ENCOUNTER — Other Ambulatory Visit: Payer: Self-pay | Admitting: Family Medicine

## 2018-05-18 DIAGNOSIS — F410 Panic disorder [episodic paroxysmal anxiety] without agoraphobia: Secondary | ICD-10-CM

## 2018-05-18 DIAGNOSIS — F411 Generalized anxiety disorder: Secondary | ICD-10-CM

## 2018-05-26 ENCOUNTER — Other Ambulatory Visit: Payer: Self-pay | Admitting: Family Medicine

## 2018-05-26 DIAGNOSIS — I10 Essential (primary) hypertension: Secondary | ICD-10-CM

## 2018-06-18 ENCOUNTER — Other Ambulatory Visit: Payer: Self-pay | Admitting: Family Medicine

## 2018-06-18 DIAGNOSIS — I10 Essential (primary) hypertension: Secondary | ICD-10-CM

## 2018-07-26 ENCOUNTER — Other Ambulatory Visit: Payer: Self-pay | Admitting: Family Medicine

## 2018-07-26 DIAGNOSIS — F411 Generalized anxiety disorder: Secondary | ICD-10-CM

## 2018-07-26 DIAGNOSIS — F410 Panic disorder [episodic paroxysmal anxiety] without agoraphobia: Secondary | ICD-10-CM

## 2018-07-30 ENCOUNTER — Other Ambulatory Visit: Payer: Self-pay | Admitting: Family Medicine

## 2018-07-30 DIAGNOSIS — I10 Essential (primary) hypertension: Secondary | ICD-10-CM

## 2018-08-25 ENCOUNTER — Other Ambulatory Visit: Payer: Self-pay | Admitting: Family Medicine

## 2018-08-25 DIAGNOSIS — I10 Essential (primary) hypertension: Secondary | ICD-10-CM

## 2018-09-24 ENCOUNTER — Other Ambulatory Visit: Payer: Self-pay | Admitting: Family Medicine

## 2018-09-24 DIAGNOSIS — I10 Essential (primary) hypertension: Secondary | ICD-10-CM

## 2018-10-24 ENCOUNTER — Other Ambulatory Visit: Payer: Self-pay | Admitting: Family Medicine

## 2018-10-24 DIAGNOSIS — F410 Panic disorder [episodic paroxysmal anxiety] without agoraphobia: Secondary | ICD-10-CM

## 2018-10-24 DIAGNOSIS — F411 Generalized anxiety disorder: Secondary | ICD-10-CM

## 2018-11-23 ENCOUNTER — Other Ambulatory Visit: Payer: Self-pay | Admitting: Family Medicine

## 2018-11-23 DIAGNOSIS — I10 Essential (primary) hypertension: Secondary | ICD-10-CM

## 2019-01-22 ENCOUNTER — Other Ambulatory Visit: Payer: Self-pay | Admitting: Family Medicine

## 2019-01-22 DIAGNOSIS — F410 Panic disorder [episodic paroxysmal anxiety] without agoraphobia: Secondary | ICD-10-CM

## 2019-01-22 DIAGNOSIS — F411 Generalized anxiety disorder: Secondary | ICD-10-CM

## 2019-02-24 ENCOUNTER — Other Ambulatory Visit: Payer: Self-pay | Admitting: Family Medicine

## 2019-02-24 DIAGNOSIS — I10 Essential (primary) hypertension: Secondary | ICD-10-CM

## 2019-03-25 ENCOUNTER — Ambulatory Visit: Payer: BLUE CROSS/BLUE SHIELD | Admitting: Family Medicine

## 2019-03-25 ENCOUNTER — Other Ambulatory Visit: Payer: Self-pay

## 2019-03-25 VITALS — BP 140/82 | HR 59 | Temp 98.1°F | Resp 18 | Ht 67.0 in | Wt 126.0 lb

## 2019-03-25 DIAGNOSIS — E7800 Pure hypercholesterolemia, unspecified: Secondary | ICD-10-CM

## 2019-03-25 DIAGNOSIS — F411 Generalized anxiety disorder: Secondary | ICD-10-CM

## 2019-03-25 DIAGNOSIS — F410 Panic disorder [episodic paroxysmal anxiety] without agoraphobia: Secondary | ICD-10-CM | POA: Diagnosis not present

## 2019-03-25 DIAGNOSIS — I10 Essential (primary) hypertension: Secondary | ICD-10-CM

## 2019-03-25 DIAGNOSIS — Z1231 Encounter for screening mammogram for malignant neoplasm of breast: Secondary | ICD-10-CM

## 2019-03-25 MED ORDER — LOSARTAN POTASSIUM 100 MG PO TABS: 100.0000 mg | ORAL_TABLET | Freq: Every day | ORAL | 5 refills | Status: DC

## 2019-03-25 MED ORDER — SERTRALINE HCL 50 MG PO TABS: ORAL_TABLET | ORAL | 0 refills | Status: DC

## 2019-03-25 NOTE — Progress Notes (Signed)
Subjective:    Patient ID: Danielle Morris, female    DOB: 13-Feb-1956, 63 y.o.   MRN: [id number]  HPI  Patient is a very pleasant 63 year old Caucasian female here today to establish care with me.  Past medical history is significant for panic disorder and generalized anxiety disorder.  Initially she was having to take lorazepam twice a day but is subsequently switched to sertraline.  On the sertraline she is not taking any lorazepam.  She believes the medication is working adequately to help manage and control her anxiety.  She is also on losartan for hypertension.  Her blood pressure today is adequately controlled at 140/82.  She admits that her blood pressure is a little bit higher here than normal due to her anxiety.  She is also due for a flu shot as well as a shingles shot which she refuses.  She is overdue for mammogram.  She also has a history of 9 polyps on a colonoscopy 5 years ago.  Her gastroenterologist recommended a repeat colonoscopy 3 years later however the patient declines this at the present time. Past Medical History:  Diagnosis Date  . Hypertension   . Panic attack    Past Surgical History:  Procedure Laterality Date  . COLONOSCOPY WITH PROPOFOL N/A 11/02/2013   Procedure: COLONOSCOPY WITH PROPOFOL;  Surgeon: Danie Binder, MD;  Location: [facility];  Service: Endoscopy;  Laterality: N/A;  in cecum @ 1002; cecal withdrawal time- minutes  . HEMORRHOID BANDING N/A 11/02/2013   Procedure: HEMORRHOID BANDING;  Surgeon: Danie Binder, MD;  Location: [facility];  Service: Endoscopy;  Laterality: N/A;  . HEMORRHOID SURGERY N/A 11/05/2013   Procedure: EXTENSIVE HEMORRHOIDECTOMY ;  Surgeon: Jamesetta So, MD;  Location: [facility];  Service: General;  Laterality: N/A;  . POLYPECTOMY N/A 11/02/2013   Procedure: POLYPECTOMY;  Surgeon: Danie Binder, MD;  Location: [facility];  Service: Endoscopy;  Laterality: N/A;  sigmoid colon, hepatic flexure, hyperplastic rectal, and rectal polyps  .  TONSILLECTOMY     age  63   No current outpatient medications on file prior to visit.   No current facility-administered medications on file prior to visit.    Allergies  Allergen Reactions  . Amlodipine Shortness Of Breath, Anxiety and Palpitations  . Tramadol     Rushes thru body, miserable, shaking and crying  . Hydrocodone Nausea And Vomiting  . Lisinopril Other (See Comments)    unknown  . Chantix [Varenicline] Nausea Only and Anxiety   Social History   Socioeconomic History  . Marital status: Married    Spouse name: Not on file  . Number of children: Not on file  . Years of education: Not on file  . Highest education level: Not on file  Occupational History  . Not on file  Social Needs  . Financial resource strain: Not on file  . Food insecurity    Worry: Not on file    Inability: Not on file  . Transportation needs    Medical: Not on file    Non-medical: Not on file  Tobacco Use  . Smoking status: Current Some Day Smoker    Packs/day: 0.50    Years: 10.00    Pack years: 5.00    Types: Cigarettes  . Smokeless tobacco: Never Used  . Tobacco comment: uses E-cigs  Substance and Sexual Activity  . Alcohol use: No  . Drug use: No  . Sexual activity: Not on file  Lifestyle  . Physical  activity    Days per week: Not on file    Minutes per session: Not on file  . Stress: Not on file  Relationships  . Social Herbalist on phone: Not on file    Gets together: Not on file    Attends religious service: Not on file    Active member of club or organization: Not on file    Attends meetings of clubs or organizations: Not on file    Relationship status: Not on file  . Intimate partner violence    Fear of current or ex partner: Not on file    Emotionally abused: Not on file    Physically abused: Not on file    Forced sexual activity: Not on file  Other Topics Concern  . Not on file  Social History Narrative   Entered 03/2014:   Married.    2  Daughters and son in law, and 1 grandchild live with them now.    Keeps another grandchild frequently also.   Does not work outside of house.     Review of Systems  All other systems reviewed and are negative.      Objective:   Physical Exam Vitals signs reviewed.  Constitutional:      Appearance: Normal appearance.  Cardiovascular:     Rate and Rhythm: Normal rate and regular rhythm.     Pulses: Normal pulses.     Heart sounds: Normal heart sounds. No murmur. No friction rub. No gallop.   Pulmonary:     Effort: Pulmonary effort is normal.     Breath sounds: Normal breath sounds. No stridor. No wheezing, rhonchi or rales.  Abdominal:     General: Abdomen is flat. Bowel sounds are normal.     Palpations: Abdomen is soft.  Musculoskeletal:     Right lower leg: No edema.     Left lower leg: No edema.  Neurological:     Mental Status: She is alert.           Assessment & Plan:  Elevated cholesterol  Essential hypertension - Plan: losartan (COZAAR) 100 MG tablet, COMPLETE METABOLIC PANEL WITH GFR, Lipid panel, CBC with Differential/Platelet  Generalized anxiety disorder - Plan: sertraline (ZOLOFT) 50 MG tablet  Panic disorder - Plan: sertraline (ZOLOFT) 50 MG tablet  Encounter for screening mammogram for malignant neoplasm of breast - Plan: MM Digital Screening  Patient's blood pressure today is adequately controlled.  Continue losartan 100 mg a day.  While the patient is here I will check a CBC, CMP, and fasting lipid panel.  She believes that the Zoloft 50 mg a day is working adequately to help manage her anxiety and prevent panic attacks.  Therefore we will continue this.  I strongly encourage the patient to quit smoking.  Her last LDL cholesterol was over 180.  Given her age, smoking, and hypertension I believe this is extremely high.  Therefore I have recommended a statin however the patient has been working diligently to lose weight and would like to recheck her  cholesterol today.  Her LDL cholesterol is higher than 130 I would still recommend a statin.  I will schedule the patient for a mammogram.  I strongly recommended a colonoscopy which she refused.  Also recommended a flu shot which she politely refused.  She is due for a Pap smear but she defers this at the present time as well

## 2019-03-26 ENCOUNTER — Encounter: Payer: Self-pay | Admitting: Family Medicine

## 2019-03-26 DIAGNOSIS — Z720 Tobacco use: Secondary | ICD-10-CM | POA: Insufficient documentation

## 2019-03-26 DIAGNOSIS — E785 Hyperlipidemia, unspecified: Secondary | ICD-10-CM | POA: Insufficient documentation

## 2019-03-26 DIAGNOSIS — F411 Generalized anxiety disorder: Secondary | ICD-10-CM | POA: Insufficient documentation

## 2019-03-26 LAB — COMPLETE METABOLIC PANEL WITH GFR
AG Ratio: 1.2 (calc) (ref 1.0–2.5)
ALT: 10 U/L (ref 6–29)
AST: 18 U/L (ref 10–35)
Albumin: 4.2 g/dL (ref 3.6–5.1)
Alkaline phosphatase (APISO): 51 U/L (ref 37–153)
BUN: 9 mg/dL (ref 7–25)
CO2: 27 mmol/L (ref 20–32)
Calcium: 9.6 mg/dL (ref 8.6–10.4)
Chloride: 96 mmol/L — ABNORMAL LOW (ref 98–110)
Creat: 0.98 mg/dL (ref 0.50–0.99)
GFR, Est African American: 73 mL/min/{1.73_m2} (ref >=60)
GFR, Est Non African American: 63 mL/min/{1.73_m2} (ref >=60)
Globulin: 3.4 g/dL (calc) (ref 1.9–3.7)
Glucose, Bld: 97 mg/dL (ref 65–99)
Potassium: 4.5 mmol/L (ref 3.5–5.3)
Sodium: 131 mmol/L — ABNORMAL LOW (ref 135–146)
Total Bilirubin: 0.5 mg/dL (ref 0.2–1.2)
Total Protein: 7.6 g/dL (ref 6.1–8.1)

## 2019-03-26 LAB — CBC WITH DIFFERENTIAL/PLATELET
Absolute Monocytes: 311 cells/uL (ref 200–950)
Basophils Absolute: 41 cells/uL (ref 0–200)
Basophils Relative: 0.9 %
Eosinophils Absolute: 90 cells/uL (ref 15–500)
Eosinophils Relative: 2 %
HCT: 43 % (ref 35.0–45.0)
Hemoglobin: 14.7 g/dL (ref 11.7–15.5)
Lymphs Abs: 1251 cells/uL (ref 850–3900)
MCH: 31.2 pg (ref 27.0–33.0)
MCHC: 34.2 g/dL (ref 32.0–36.0)
MCV: 91.3 fL (ref 80.0–100.0)
MPV: 10.3 fL (ref 7.5–12.5)
Monocytes Relative: 6.9 %
Neutro Abs: 2808 cells/uL (ref 1500–7800)
Neutrophils Relative %: 62.4 %
Platelets: 262 10*3/uL (ref 140–400)
RBC: 4.71 10*6/uL (ref 3.80–5.10)
RDW: 13 % (ref 11.0–15.0)
Total Lymphocyte: 27.8 %
WBC: 4.5 10*3/uL (ref 3.8–10.8)

## 2019-03-26 LAB — LIPID PANEL
Cholesterol: 249 mg/dL — ABNORMAL HIGH (ref <200)
HDL: 45 mg/dL — ABNORMAL LOW (ref >=50)
LDL Cholesterol (Calc): 179 mg/dL (calc) — ABNORMAL HIGH
Non-HDL Cholesterol (Calc): 204 mg/dL (calc) — ABNORMAL HIGH (ref <130)
Total CHOL/HDL Ratio: 5.5 (calc) — ABNORMAL HIGH (ref <5.0)
Triglycerides: 120 mg/dL (ref <150)

## 2019-03-27 ENCOUNTER — Other Ambulatory Visit: Payer: Self-pay | Admitting: Family Medicine

## 2019-03-27 DIAGNOSIS — I10 Essential (primary) hypertension: Secondary | ICD-10-CM

## 2019-03-30 ENCOUNTER — Other Ambulatory Visit: Payer: Self-pay | Admitting: Family Medicine

## 2019-03-30 MED ORDER — ATORVASTATIN CALCIUM 20 MG PO TABS: 20.0000 mg | ORAL_TABLET | Freq: Every day | ORAL | 1 refills | Status: DC

## 2019-06-21 ENCOUNTER — Other Ambulatory Visit: Payer: Self-pay | Admitting: Family Medicine

## 2019-06-21 DIAGNOSIS — F410 Panic disorder [episodic paroxysmal anxiety] without agoraphobia: Secondary | ICD-10-CM

## 2019-06-21 DIAGNOSIS — F411 Generalized anxiety disorder: Secondary | ICD-10-CM

## 2019-07-20 ENCOUNTER — Ambulatory Visit (INDEPENDENT_AMBULATORY_CARE_PROVIDER_SITE_OTHER): Payer: BC Managed Care – PPO | Admitting: Family Medicine

## 2019-07-20 ENCOUNTER — Other Ambulatory Visit: Payer: Self-pay

## 2019-07-20 ENCOUNTER — Encounter: Payer: Self-pay | Admitting: Family Medicine

## 2019-07-20 VITALS — BP 136/84 | HR 74 | Temp 97.4°F | Resp 18 | Ht 66.0 in | Wt 128.0 lb

## 2019-07-20 DIAGNOSIS — I10 Essential (primary) hypertension: Secondary | ICD-10-CM | POA: Diagnosis not present

## 2019-07-20 DIAGNOSIS — F411 Generalized anxiety disorder: Secondary | ICD-10-CM | POA: Diagnosis not present

## 2019-07-20 DIAGNOSIS — F17200 Nicotine dependence, unspecified, uncomplicated: Secondary | ICD-10-CM

## 2019-07-20 DIAGNOSIS — E785 Hyperlipidemia, unspecified: Secondary | ICD-10-CM | POA: Diagnosis not present

## 2019-07-20 DIAGNOSIS — R739 Hyperglycemia, unspecified: Secondary | ICD-10-CM | POA: Diagnosis not present

## 2019-07-20 NOTE — Progress Notes (Signed)
Subjective:    Patient ID: Danielle Morris, female    DOB: [birth year], 64 y.o.   MRN: [id number]  HPI  Patient is a very pleasant 64 year old Caucasian female who is here today for medicine check.  Since I last saw the patient she has been taking Lipitor 20 mg a day for hyperlipidemia.  When I last saw the patient her total cholesterol was 249 and her LDL cholesterol is 179.  Since starting the Lipitor she has been dealing with occasional muscle cramps but she can tolerate the medication.  Throughout her chart there is mention of diabetes however her last fasting blood sugar was 103 on no medication.  Therefore the patient is not a diabetic.  She does not require an annual diabetic eye exam or foot exam.  I believe this information in her chart is incorrect.  I will check a hemoglobin A1c to ensure that the blood sugar I saw last time was not in error and if her hemoglobin A1c is well below 6.5, I will remove diabetes from her problem list.  She is overdue for a colonoscopy.  She had a colonoscopy in 2015 that showed 3 tubular adenoma.  However the patient refuses a colonoscopy at the present time and wants to wait until after Covid.  She is also overdue for mammogram but likewise wants to wait until after Covid.  She is still smoking a pack of cigarettes a day.  She has no documented history of asthma or emphysema however on today's exam she is wheezing slightly.  She also has diminished air movement suggesting possible underlying obstructive lung disease. Past Medical History:  Diagnosis Date  . GAD (generalized anxiety disorder)   . HLD (hyperlipidemia)   . Hypertension   . Panic attack   . Tobacco abuse    Past Surgical History:  Procedure Laterality Date  . COLONOSCOPY WITH PROPOFOL N/A 11/02/2013   Procedure: COLONOSCOPY WITH PROPOFOL;  Surgeon: Danie Binder, MD;  Location: [facility];  Service: Endoscopy;  Laterality: N/A;  in cecum @ 1002; cecal withdrawal time- minutes  . HEMORRHOID BANDING  N/A 11/02/2013   Procedure: HEMORRHOID BANDING;  Surgeon: Danie Binder, MD;  Location: [facility];  Service: Endoscopy;  Laterality: N/A;  . HEMORRHOID SURGERY N/A 11/05/2013   Procedure: EXTENSIVE HEMORRHOIDECTOMY ;  Surgeon: Jamesetta So, MD;  Location: [facility];  Service: General;  Laterality: N/A;  . POLYPECTOMY N/A 11/02/2013   Procedure: POLYPECTOMY;  Surgeon: Danie Binder, MD;  Location: [facility];  Service: Endoscopy;  Laterality: N/A;  sigmoid colon, hepatic flexure, hyperplastic rectal, and rectal polyps  . TONSILLECTOMY     age  64   Current Outpatient Medications on File Prior to Visit  Medication Sig Dispense Refill  . atorvastatin (LIPITOR) 20 MG tablet Take 1 tablet (20 mg total) by mouth daily. 90 tablet 1  . losartan (COZAAR) 100 MG tablet TAKE 1 TABLET BY MOUTH DAILY 30 tablet 5  . sertraline (ZOLOFT) 50 MG tablet TAKE 1 TABLET(50 MG) BY MOUTH DAILY 90 tablet 0   No current facility-administered medications on file prior to visit.   Allergies  Allergen Reactions  . Amlodipine Shortness Of Breath, Anxiety and Palpitations  . Tramadol     Rushes thru body, miserable, shaking and crying  . Hydrocodone Nausea And Vomiting  . Lisinopril Other (See Comments)    unknown  . Chantix [Varenicline] Nausea Only and Anxiety   Social History   Socioeconomic History  . Marital  status: Married    Spouse name: Not on file  . Number of children: Not on file  . Years of education: Not on file  . Highest education level: Not on file  Occupational History  . Not on file  Tobacco Use  . Smoking status: Current Some Day Smoker    Packs/day: 0.50    Years: 10.00    Pack years: 5.00    Types: Cigarettes  . Smokeless tobacco: Never Used  . Tobacco comment: uses E-cigs  Substance and Sexual Activity  . Alcohol use: No  . Drug use: No  . Sexual activity: Not on file  Other Topics Concern  . Not on file  Social History Narrative   Entered 03/2014:   Married.    2 Daughters and  son in law, and 1 grandchild live with them now.    Keeps another grandchild frequently also.   Does not work outside of house.   Social Determinants of Health   Financial Resource Strain:   . Difficulty of Paying Living Expenses: Not on file  Food Insecurity:   . Worried About Charity fundraiser in the Last Year: Not on file  . Ran Out of Food in the Last Year: Not on file  Transportation Needs:   . Lack of Transportation (Medical): Not on file  . Lack of Transportation (Non-Medical): Not on file  Physical Activity:   . Days of Exercise per Week: Not on file  . Minutes of Exercise per Session: Not on file  Stress:   . Feeling of Stress : Not on file  Social Connections:   . Frequency of Communication with Friends and Family: Not on file  . Frequency of Social Gatherings with Friends and Family: Not on file  . Attends Religious Services: Not on file  . Active Member of Clubs or Organizations: Not on file  . Attends Archivist Meetings: Not on file  . Marital Status: Not on file  Intimate Partner Violence:   . Fear of Current or Ex-Partner: Not on file  . Emotionally Abused: Not on file  . Physically Abused: Not on file  . Sexually Abused: Not on file     Review of Systems  All other systems reviewed and are negative.      Objective:   Physical Exam Vitals reviewed.  Constitutional:      Appearance: Normal appearance.  Cardiovascular:     Rate and Rhythm: Normal rate and regular rhythm.     Pulses: Normal pulses.     Heart sounds: Normal heart sounds. No murmur. No friction rub. No gallop.   Pulmonary:     Effort: Pulmonary effort is normal.     Breath sounds: No stridor. Decreased breath sounds and wheezing present. No rhonchi or rales.  Abdominal:     General: Abdomen is flat. Bowel sounds are normal.     Palpations: Abdomen is soft.  Musculoskeletal:     Right lower leg: No edema.     Left lower leg: No edema.  Neurological:     Mental Status:  She is alert.           Assessment & Plan:  Essential hypertension  Generalized anxiety disorder  Hyperlipidemia, unspecified hyperlipidemia type - Plan: CBC with Differential/Platelet, COMPLETE METABOLIC PANEL WITH GFR, Lipid panel  Smoker  Hyperglycemia - Plan: Hemoglobin A1c, CBC with Differential/Platelet, COMPLETE METABOLIC PANEL WITH GFR, Lipid panel  Patient's cholesterol was extremely high at her last visit.  I will  check a fasting lipid panel today and if her LDL cholesterol is less than 130, I would make no further changes in her Lipitor.  I did strongly encourage the patient to quit smoking.  I am also concerned that she may be developing underlying COPD based on her exam.  At the present time she is asymptomatic however I strongly encouraged that she quit smoking to prevent developing worsening COPD.  I recommended a mammogram along with a colonoscopy and a Pap smear.  The patient declines all of these at the present time and would like to wait until after the current pandemic has subsided.  She refuses a flu shot today.  I will check a hemoglobin A1c however I do not believe the patient is a diabetic.  Her last fasting blood sugar was only mildly elevated at 103.  Therefore her hemoglobin A1c is less than 6.5 I will remove diabetes from problem list.

## 2019-07-21 LAB — CBC WITH DIFFERENTIAL/PLATELET
Absolute Monocytes: 400 cells/uL (ref 200–950)
Basophils Absolute: 41 cells/uL (ref 0–200)
Basophils Relative: 0.7 %
Eosinophils Absolute: 122 cells/uL (ref 15–500)
Eosinophils Relative: 2.1 %
HCT: 42.3 % (ref 35.0–45.0)
Hemoglobin: 14.4 g/dL (ref 11.7–15.5)
Lymphs Abs: 1456 cells/uL (ref 850–3900)
MCH: 31.2 pg (ref 27.0–33.0)
MCHC: 34 g/dL (ref 32.0–36.0)
MCV: 91.6 fL (ref 80.0–100.0)
MPV: 10.3 fL (ref 7.5–12.5)
Monocytes Relative: 6.9 %
Neutro Abs: 3782 cells/uL (ref 1500–7800)
Neutrophils Relative %: 65.2 %
Platelets: 281 10*3/uL (ref 140–400)
RBC: 4.62 10*6/uL (ref 3.80–5.10)
RDW: 12.6 % (ref 11.0–15.0)
Total Lymphocyte: 25.1 %
WBC: 5.8 10*3/uL (ref 3.8–10.8)

## 2019-07-21 LAB — COMPLETE METABOLIC PANEL WITH GFR
AG Ratio: 1.3 (calc) (ref 1.0–2.5)
ALT: 10 U/L (ref 6–29)
AST: 15 U/L (ref 10–35)
Albumin: 4.3 g/dL (ref 3.6–5.1)
Alkaline phosphatase (APISO): 54 U/L (ref 37–153)
BUN: 9 mg/dL (ref 7–25)
CO2: 28 mmol/L (ref 20–32)
Calcium: 9.5 mg/dL (ref 8.6–10.4)
Chloride: 96 mmol/L — ABNORMAL LOW (ref 98–110)
Creat: 0.92 mg/dL (ref 0.50–0.99)
GFR, Est African American: 78 mL/min/{1.73_m2} (ref >=60)
GFR, Est Non African American: 67 mL/min/{1.73_m2} (ref >=60)
Globulin: 3.2 g/dL (calc) (ref 1.9–3.7)
Glucose, Bld: 96 mg/dL (ref 65–99)
Potassium: 4.5 mmol/L (ref 3.5–5.3)
Sodium: 134 mmol/L — ABNORMAL LOW (ref 135–146)
Total Bilirubin: 0.6 mg/dL (ref 0.2–1.2)
Total Protein: 7.5 g/dL (ref 6.1–8.1)

## 2019-07-21 LAB — LIPID PANEL
Cholesterol: 261 mg/dL — ABNORMAL HIGH (ref <200)
HDL: 42 mg/dL — ABNORMAL LOW (ref >=50)
LDL Cholesterol (Calc): 193 mg/dL (calc) — ABNORMAL HIGH
Non-HDL Cholesterol (Calc): 219 mg/dL (calc) — ABNORMAL HIGH (ref <130)
Total CHOL/HDL Ratio: 6.2 (calc) — ABNORMAL HIGH (ref <5.0)
Triglycerides: 128 mg/dL (ref <150)

## 2019-07-21 LAB — HEMOGLOBIN A1C
Hgb A1c MFr Bld: 5.1 % of total Hgb (ref <5.7)
Mean Plasma Glucose: 100 (calc)
eAG (mmol/L): 5.5 (calc)

## 2019-07-27 ENCOUNTER — Encounter: Payer: Self-pay | Admitting: Family Medicine

## 2019-07-28 ENCOUNTER — Other Ambulatory Visit: Payer: Self-pay | Admitting: Family Medicine

## 2019-07-28 MED ORDER — ATORVASTATIN CALCIUM 40 MG PO TABS: 40.0000 mg | ORAL_TABLET | Freq: Every day | ORAL | 3 refills | Status: DC

## 2019-09-20 ENCOUNTER — Emergency Department (HOSPITAL_COMMUNITY): Payer: BC Managed Care – PPO

## 2019-09-20 ENCOUNTER — Other Ambulatory Visit: Payer: Self-pay

## 2019-09-20 ENCOUNTER — Inpatient Hospital Stay (HOSPITAL_COMMUNITY)
Admission: EM | Admit: 2019-09-20 | Discharge: 2019-09-24 | DRG: 372 | Disposition: A | Discharge status: Home / Self Care | Payer: BC Managed Care – PPO | Attending: Internal Medicine | Admitting: Internal Medicine

## 2019-09-20 ENCOUNTER — Encounter (HOSPITAL_COMMUNITY): Payer: Self-pay | Admitting: *Deleted

## 2019-09-20 DIAGNOSIS — Z8249 Family history of ischemic heart disease and other diseases of the circulatory system: Secondary | ICD-10-CM | POA: Diagnosis not present

## 2019-09-20 DIAGNOSIS — I10 Essential (primary) hypertension: Secondary | ICD-10-CM | POA: Diagnosis not present

## 2019-09-20 DIAGNOSIS — Z885 Allergy status to narcotic agent status: Secondary | ICD-10-CM

## 2019-09-20 DIAGNOSIS — N1330 Unspecified hydronephrosis: Secondary | ICD-10-CM | POA: Diagnosis not present

## 2019-09-20 DIAGNOSIS — K3533 Acute appendicitis with perforation and localized peritonitis, with abscess: Principal | ICD-10-CM | POA: Diagnosis present

## 2019-09-20 DIAGNOSIS — N131 Hydronephrosis with ureteral stricture, not elsewhere classified: Secondary | ICD-10-CM | POA: Diagnosis not present

## 2019-09-20 DIAGNOSIS — Z888 Allergy status to other drugs, medicaments and biological substances status: Secondary | ICD-10-CM | POA: Diagnosis not present

## 2019-09-20 DIAGNOSIS — R1084 Generalized abdominal pain: Secondary | ICD-10-CM

## 2019-09-20 DIAGNOSIS — R112 Nausea with vomiting, unspecified: Secondary | ICD-10-CM | POA: Diagnosis not present

## 2019-09-20 DIAGNOSIS — F411 Generalized anxiety disorder: Secondary | ICD-10-CM | POA: Diagnosis present

## 2019-09-20 DIAGNOSIS — F17210 Nicotine dependence, cigarettes, uncomplicated: Secondary | ICD-10-CM | POA: Diagnosis not present

## 2019-09-20 DIAGNOSIS — K219 Gastro-esophageal reflux disease without esophagitis: Secondary | ICD-10-CM | POA: Diagnosis present

## 2019-09-20 DIAGNOSIS — E871 Hypo-osmolality and hyponatremia: Secondary | ICD-10-CM

## 2019-09-20 DIAGNOSIS — I340 Nonrheumatic mitral (valve) insufficiency: Secondary | ICD-10-CM | POA: Diagnosis not present

## 2019-09-20 DIAGNOSIS — E785 Hyperlipidemia, unspecified: Secondary | ICD-10-CM | POA: Diagnosis present

## 2019-09-20 DIAGNOSIS — K8020 Calculus of gallbladder without cholecystitis without obstruction: Secondary | ICD-10-CM | POA: Diagnosis not present

## 2019-09-20 DIAGNOSIS — Z720 Tobacco use: Secondary | ICD-10-CM | POA: Diagnosis not present

## 2019-09-20 DIAGNOSIS — K3532 Acute appendicitis with perforation and localized peritonitis, without abscess: Secondary | ICD-10-CM | POA: Diagnosis not present

## 2019-09-20 DIAGNOSIS — R1031 Right lower quadrant pain: Secondary | ICD-10-CM | POA: Diagnosis not present

## 2019-09-20 DIAGNOSIS — Z20822 Contact with and (suspected) exposure to covid-19: Secondary | ICD-10-CM | POA: Diagnosis present

## 2019-09-20 DIAGNOSIS — E782 Mixed hyperlipidemia: Secondary | ICD-10-CM | POA: Diagnosis not present

## 2019-09-20 DIAGNOSIS — R109 Unspecified abdominal pain: Secondary | ICD-10-CM | POA: Diagnosis present

## 2019-09-20 LAB — CBC WITH DIFFERENTIAL/PLATELET
Abs Immature Granulocytes: 0.08 10*3/uL — ABNORMAL HIGH (ref 0.00–0.07)
Basophils Absolute: 0 10*3/uL (ref 0.0–0.1)
Basophils Relative: 0 %
Eosinophils Absolute: 0 10*3/uL (ref 0.0–0.5)
Eosinophils Relative: 0 %
HCT: 36.8 % (ref 36.0–46.0)
Hemoglobin: 12.4 g/dL (ref 12.0–15.0)
Immature Granulocytes: 1 %
Lymphocytes Relative: 9 %
Lymphs Abs: 1 10*3/uL (ref 0.7–4.0)
MCH: 30.5 pg (ref 26.0–34.0)
MCHC: 33.7 g/dL (ref 30.0–36.0)
MCV: 90.4 fL (ref 80.0–100.0)
Monocytes Absolute: 0.6 10*3/uL (ref 0.1–1.0)
Monocytes Relative: 5 %
Neutro Abs: 9.7 10*3/uL — ABNORMAL HIGH (ref 1.7–7.7)
Neutrophils Relative %: 85 %
Platelets: 436 10*3/uL — ABNORMAL HIGH (ref 150–400)
RBC: 4.07 MIL/uL (ref 3.87–5.11)
RDW: 13 % (ref 11.5–15.5)
WBC: 11.4 10*3/uL — ABNORMAL HIGH (ref 4.0–10.5)
nRBC: 0 % (ref 0.0–0.2)

## 2019-09-20 LAB — URINALYSIS, ROUTINE W REFLEX MICROSCOPIC
Bacteria, UA: NONE SEEN
Bilirubin Urine: NEGATIVE
Glucose, UA: NEGATIVE mg/dL
Ketones, ur: NEGATIVE mg/dL
Leukocytes,Ua: NEGATIVE
Nitrite: NEGATIVE
Protein, ur: NEGATIVE mg/dL
Specific Gravity, Urine: 1.015 (ref 1.005–1.030)
pH: 6 (ref 5.0–8.0)

## 2019-09-20 LAB — RESPIRATORY PANEL BY RT PCR (FLU A&B, COVID)
Influenza A by PCR: NEGATIVE
Influenza B by PCR: NEGATIVE
SARS Coronavirus 2 by RT PCR: NEGATIVE

## 2019-09-20 LAB — COMPREHENSIVE METABOLIC PANEL
ALT: 29 U/L (ref 0–44)
AST: 24 U/L (ref 15–41)
Albumin: 3.3 g/dL — ABNORMAL LOW (ref 3.5–5.0)
Alkaline Phosphatase: 83 U/L (ref 38–126)
Anion gap: 12 (ref 5–15)
BUN: 10 mg/dL (ref 8–23)
CO2: 22 mmol/L (ref 22–32)
Calcium: 9 mg/dL (ref 8.9–10.3)
Chloride: 94 mmol/L — ABNORMAL LOW (ref 98–111)
Creatinine, Ser: 0.74 mg/dL (ref 0.44–1.00)
GFR calc Af Amer: >60 mL/min (ref >60)
GFR calc non Af Amer: >60 mL/min (ref >60)
Glucose, Bld: 100 mg/dL — ABNORMAL HIGH (ref 70–99)
Potassium: 3.6 mmol/L (ref 3.5–5.1)
Sodium: 128 mmol/L — ABNORMAL LOW (ref 135–145)
Total Bilirubin: 0.7 mg/dL (ref 0.3–1.2)
Total Protein: 8.4 g/dL — ABNORMAL HIGH (ref 6.5–8.1)

## 2019-09-20 LAB — PROTIME-INR
INR: 1.2 (ref 0.8–1.2)
Prothrombin Time: 14.9 seconds (ref 11.4–15.2)

## 2019-09-20 LAB — CREATININE, URINE, RANDOM: Creatinine, Urine: 43.73 mg/dL

## 2019-09-20 LAB — MAGNESIUM: Magnesium: 2.1 mg/dL (ref 1.7–2.4)

## 2019-09-20 LAB — SODIUM, URINE, RANDOM: Sodium, Ur: 13 mmol/L

## 2019-09-20 LAB — LIPASE, BLOOD: Lipase: 35 U/L (ref 11–51)

## 2019-09-20 MED ORDER — SODIUM CHLORIDE 0.9% FLUSH
3.0000 mL | Freq: Two times a day (BID) | INTRAVENOUS | Status: DC
  Administered 2019-09-20 – 2019-09-22 (×5): 3 mL via INTRAVENOUS

## 2019-09-20 MED ORDER — ONDANSETRON HCL 4 MG/2ML IJ SOLN
4.0000 mg | Freq: Once | INTRAMUSCULAR | Status: DC
  Filled 2019-09-20: qty 2

## 2019-09-20 MED ORDER — PIPERACILLIN-TAZOBACTAM 3.375 G IVPB 30 MIN
3.3750 g | Freq: Once | INTRAVENOUS | Status: AC
  Administered 2019-09-20: 3.375 g via INTRAVENOUS
  Filled 2019-09-20: qty 50

## 2019-09-20 MED ORDER — ONDANSETRON HCL 4 MG/2ML IJ SOLN: 4.0000 mg | Freq: Four times a day (QID) | INTRAMUSCULAR | Status: DC | PRN

## 2019-09-20 MED ORDER — ONDANSETRON HCL 4 MG/2ML IJ SOLN
4.0000 mg | Freq: Once | INTRAMUSCULAR | Status: AC
  Administered 2019-09-20: 4 mg via INTRAVENOUS
  Filled 2019-09-20: qty 2

## 2019-09-20 MED ORDER — NICOTINE 14 MG/24HR TD PT24
14.0000 mg | MEDICATED_PATCH | Freq: Every day | TRANSDERMAL | Status: DC | PRN
  Filled 2019-09-20 (×2): qty 1

## 2019-09-20 MED ORDER — PROMETHAZINE HCL 25 MG/ML IJ SOLN
12.5000 mg | Freq: Four times a day (QID) | INTRAMUSCULAR | Status: DC | PRN
  Administered 2019-09-20: 12.5 mg via INTRAVENOUS
  Filled 2019-09-20 (×2): qty 1

## 2019-09-20 MED ORDER — IOHEXOL 300 MG/ML  SOLN
100.0000 mL | Freq: Once | INTRAMUSCULAR | Status: AC | PRN
  Administered 2019-09-20: 100 mL via INTRAVENOUS

## 2019-09-20 MED ORDER — ONDANSETRON HCL 4 MG/2ML IJ SOLN: 4.0000 mg | Freq: Once | INTRAMUSCULAR | Status: DC

## 2019-09-20 MED ORDER — LACTATED RINGERS IV SOLN: INTRAVENOUS | Status: DC

## 2019-09-20 MED ORDER — HYDROMORPHONE HCL 1 MG/ML IJ SOLN
1.0000 mg | Freq: Once | INTRAMUSCULAR | Status: AC
  Administered 2019-09-20: 1 mg via INTRAVENOUS
  Filled 2019-09-20: qty 1

## 2019-09-20 MED ORDER — SODIUM CHLORIDE 0.9 % IV BOLUS
1000.0000 mL | Freq: Once | INTRAVENOUS | Status: AC
  Administered 2019-09-20: 1000 mL via INTRAVENOUS

## 2019-09-20 MED ORDER — MORPHINE SULFATE (PF) 2 MG/ML IV SOLN
2.0000 mg | Freq: Once | INTRAVENOUS | Status: AC
  Administered 2019-09-20: 2 mg via INTRAVENOUS
  Filled 2019-09-20: qty 1

## 2019-09-20 MED ORDER — PIPERACILLIN-TAZOBACTAM 3.375 G IVPB
3.3750 g | Freq: Three times a day (TID) | INTRAVENOUS | Status: DC
  Administered 2019-09-20 – 2019-09-24 (×11): 3.375 g via INTRAVENOUS
  Filled 2019-09-20 (×11): qty 50

## 2019-09-20 MED ORDER — MORPHINE SULFATE (PF) 2 MG/ML IV SOLN
2.0000 mg | INTRAVENOUS | Status: DC | PRN
  Administered 2019-09-20 – 2019-09-23 (×10): 2 mg via INTRAVENOUS
  Filled 2019-09-20 (×11): qty 1

## 2019-09-20 NOTE — H&P (Addendum)
History and Physical    PLEASE NOTE THAT DRAGON DICTATION SOFTWARE WAS USED IN THE CONSTRUCTION OF THIS NOTE.   Danielle Morris QQI:[id number] DOB: 1955/09/13 DOA: 09/20/2019  PCP: Susy Frizzle, MD Patient coming from: home   I have personally briefly reviewed patient's old medical records in [place]  Chief Complaint: Abdominal pain  HPI: Danielle Morris is a 64 y.o. female with medical history significant for hypertension, hyperlipidemia, who is admitted to [facility] on 09/20/2019 with ruptured appendix with associated abscess after presenting from home to [facility] Emergency Department complaining of abdominal pain.   The patient reports 2 weeks of generalized abdominal discomfort.  She describes this pain as sharp in nature, and while it is generalized in distribution, she reports that the pain is most severe in the right lower quadrant and worsens with tenderness over this area.  She reports associated intermittent nausea resulting in 2-3 episodes of nonbloody, nonbilious emesis over the last 48 hours.  She also reports associated intermittent loose stool over that timeframe in the absence of any associated melena or hematochezia.  Denies any recent travel or trauma.  She reports associated subjective fever in the absence of chills, rigors, or generalized myalgias.  Denies any associated headache, neck stiffness, rhinitis, rhinorrhea, sore throat, sob, cough, or rash. No known COVID-19 exposures. Denies dysuria, gross hematuria, or change in urinary urgency/frequency.  Denies any recent chest pain, diaphoresis, or palpitations.  The patient denies any history of coronary artery disease or chronic heart failure.  She reports that she is on no blood thinning agents at home, including no use of aspirin.   ED Course:  Vital signs in the ED were notable for the following: Temperature max 98.8; heart rate 6880; blood pressure 113/89 - 122/80; respiratory rate 15-20, and oxygen saturation  100% on room air.  Labs were notable for the following: CMP notable for sodium 128 relative to most recent prior value 134 on 07/20/2019, potassium 3.6, chloride 94, bicarbonate 21, anion gap 12, creatinine 0.74, glucose 100, and liver enzymes were found to be within normal limits.  Lipase 35.  CBC notable for white blood cell count of 11,400 with 85% neutrophils, hemoglobin 12.4.  INR 1.2.  Nasopharyngeal COVID-19 PCR swab was obtained in the ED this evening, with result currently pending.  CT abdomen/pelvis with contrast was notable for large rim-enhancing fluid collection in the vicinity of the appendix, consistent with appendiceal abscess, and also showed moderate right hydronephrosis with appearance of distal right ureteral obstruction due to overlying fluid collection in the absence of any evidence of ureterolithiasis, and showed no evidence of bowel obstruction or perforation.   The patient's case and imaging were discussed with the on-call general surgeon, Dr. Constance Haw, who recommended admission to the hospitalist service at [facility], with plan for Dr. Constance Haw to evaluate the patient in the morning, at which time it would be determined if the patient would benefit from IR drainage. In the meantime, Dr. Constance Haw recommended that the patient be kept n.p.o. and be started on IV antibiotics.  While in the ED, the following were administered: Morphine 2 mg IV x1, Dilaudid 1 mg IV x1, Zosyn x1 dose, Zofran 4 mg IV x2, and a 1 L IV normal saline bolus.    Review of Systems: As per HPI otherwise 10 point review of systems negative.   Past Medical History:  Diagnosis Date  . GAD (generalized anxiety disorder)   . HLD (hyperlipidemia)   .  Hypertension   . Panic attack   . Tobacco abuse     Past Surgical History:  Procedure Laterality Date  . COLONOSCOPY WITH PROPOFOL N/A 11/02/2013   Procedure: COLONOSCOPY WITH PROPOFOL;  Surgeon: Danie Binder, MD;  Location: [facility];  Service:  Endoscopy;  Laterality: N/A;  in cecum @ 1002; cecal withdrawal time- minutes  . HEMORRHOID BANDING N/A 11/02/2013   Procedure: HEMORRHOID BANDING;  Surgeon: Danie Binder, MD;  Location: [facility];  Service: Endoscopy;  Laterality: N/A;  . HEMORRHOID SURGERY N/A 11/05/2013   Procedure: EXTENSIVE HEMORRHOIDECTOMY ;  Surgeon: Jamesetta So, MD;  Location: [facility];  Service: General;  Laterality: N/A;  . POLYPECTOMY N/A 11/02/2013   Procedure: POLYPECTOMY;  Surgeon: Danie Binder, MD;  Location: [facility];  Service: Endoscopy;  Laterality: N/A;  sigmoid colon, hepatic flexure, hyperplastic rectal, and rectal polyps  . TONSILLECTOMY     age  30    Social History:  reports that she has been smoking cigarettes. She has a 5.00 pack-year smoking history. She has never used smokeless tobacco. She reports that she does not drink alcohol or use drugs.   Allergies  Allergen Reactions  . Amlodipine Shortness Of Breath, Anxiety and Palpitations  . Tramadol     Rushes thru body, miserable, shaking and crying  . Hydrocodone Nausea And Vomiting  . Lisinopril Other (See Comments)    unknown  . Chantix [Varenicline] Nausea Only and Anxiety    Family History  Problem Relation Age of Onset  . Heart disease Father 2       MI--H/O Rheumatic Fever as a child  . Hypertension Brother   . Hypertension Brother   . Diabetes Other     Prior to Admission medications   Medication Sig Start Date End Date Taking? Authorizing Provider  atorvastatin (LIPITOR) 40 MG tablet Take 1 tablet (40 mg total) by mouth daily. 07/28/19   Susy Frizzle, MD  losartan (COZAAR) 100 MG tablet TAKE 1 TABLET BY MOUTH DAILY 03/29/19   Alycia Rossetti, MD  sertraline (ZOLOFT) 50 MG tablet TAKE 1 TABLET(50 MG) BY MOUTH DAILY 06/21/19   Susy Frizzle, MD     Objective    Physical Exam: Vitals:   09/20/19 1511 09/20/19 1514 09/20/19 1600  BP:  122/80 113/89  Pulse:  80 75  Resp:  20 15  Temp:  98.8 F (37.1 C)   SpO2:   100% 100%  Weight:  54.4 kg   Height: _0  (1.702 m) _1  (1.702 m)     General: appears to be stated age; alert, oriented Skin: warm, dry, no rash Head:  AT/[place] Eyes:  PEARL b/l, EOMI Mouth:  Oral mucosa membranes appear dry, normal dentition Neck: supple; trachea midline Heart:  RRR; did not appreciate any M/R/G Lungs: CTAB, did not appreciate any wheezes, rales, or rhonchi Abdomen: + BS; soft, ND; mild generalized tenderness to palpation in the absence of any associated guarding, rigidity, or rebound tenderness. Vascular: 2+ pedal pulses b/l; 2+ radial pulses b/l Extremities: no peripheral edema, no muscle wasting Neuro: strength and sensation intact in upper and lower extremities b/l   Labs on Admission: I have personally reviewed following labs and imaging studies  CBC: Recent Labs  Lab 09/20/19 1558  WBC 11.4*  NEUTROABS 9.7*  HGB 12.4  HCT 36.8  MCV 90.4  PLT 443*   Basic Metabolic Panel: Recent Labs  Lab 09/20/19 1558  NA 128*  K 3.6  CL 94*  CO2 22  GLUCOSE 100*  BUN 10  CREATININE 0.74  CALCIUM 9.0   GFR: Estimated Creatinine Clearance: 63.4 mL/min (by C-G formula based on SCr of 0.74 mg/dL). Liver Function Tests: Recent Labs  Lab 09/20/19 1558  AST 24  ALT 29  ALKPHOS 83  BILITOT 0.7  PROT 8.4*  ALBUMIN 3.3*   Recent Labs  Lab 09/20/19 1558  LIPASE 35   No results for input(s): AMMONIA in the last 168 hours. Coagulation Profile: No results for input(s): INR, PROTIME in the last 168 hours. Cardiac Enzymes: No results for input(s): CKTOTAL, CKMB, CKMBINDEX, TROPONINI in the last 168 hours. BNP (last 3 results) No results for input(s): PROBNP in the last 8760 hours. HbA1C: No results for input(s): HGBA1C in the last 72 hours. CBG: No results for input(s): GLUCAP in the last 168 hours. Lipid Profile: No results for input(s): CHOL, HDL, LDLCALC, TRIG, CHOLHDL, LDLDIRECT in the last 72 hours. Thyroid Function Tests: No results for  input(s): TSH, T4TOTAL, FREET4, T3FREE, THYROIDAB in the last 72 hours. Anemia Panel: No results for input(s): VITAMINB12, FOLATE, FERRITIN, TIBC, IRON, RETICCTPCT in the last 72 hours. Urine analysis:    Component Value Date/Time   COLORURINE YELLOW 06/16/2017 1915   APPEARANCEUR CLEAR 06/16/2017 1915   LABSPEC 1.005 06/16/2017 1915   PHURINE 6.0 06/16/2017 1915   GLUCOSEU NEGATIVE 06/16/2017 1915   HGBUR NEGATIVE 06/16/2017 1915   BILIRUBINUR NEGATIVE 06/16/2017 1915   KETONESUR NEGATIVE 06/16/2017 1915   PROTEINUR NEGATIVE 06/16/2017 1915   UROBILINOGEN 0.2 10/23/2013 2120   NITRITE NEGATIVE 06/16/2017 1915   LEUKOCYTESUR NEGATIVE 06/16/2017 1915    Radiological Exams on Admission: CT ABDOMEN PELVIS W CONTRAST  Result Date: 09/20/2019 CLINICAL DATA:  Abdominal pain for 2 weeks, neutropenia EXAM: CT ABDOMEN AND PELVIS WITH CONTRAST TECHNIQUE: Multidetector CT imaging of the abdomen and pelvis was performed using the standard protocol following bolus administration of intravenous contrast. CONTRAST:  160m OMNIPAQUE IOHEXOL 300 MG/ML  SOLN COMPARISON:  None. FINDINGS: Lower chest: No acute abnormality.  Coronary artery disease. Hepatobiliary: No solid liver abnormality is seen. Rim calcified gallstone in the gallbladder. No gallbladder wall thickening, or biliary dilatation. Pancreas: Unremarkable. No pancreatic ductal dilatation or surrounding inflammatory changes. Spleen: Normal in size without significant abnormality. Adrenals/Urinary Tract: Adrenal glands are unremarkable. Moderate right hydronephrosis and hydroureter, the distal third of the right ureter appears effaced and obstructed by overlying fluid collection. The left kidney is normal. Bladder is unremarkable. Stomach/Bowel: Stomach is within normal limits. There is a large, rim enhancing fluid collection in the expected vicinity of the appendix measuring 9.5 x 4.5 x 5.0 cm (series 2, image 62). There appears to be a dependent  calcified appendicolith. This collection is very closely apposed to the terminal loop of ileum, which is thickened appearing. No evidence of bowel wall thickening, distention, or inflammatory changes. Vascular/Lymphatic: Aortic atherosclerosis. No enlarged abdominal or pelvic lymph nodes. Reproductive: No mass or other significant abnormality. Other: No abdominal wall hernia or abnormality. Trace fluid in the right lower quadrant. Musculoskeletal: No acute or significant osseous findings. IMPRESSION: 1. There is a large, rim enhancing fluid collection in the expected vicinity of the appendix measuring 9.5 x 4.5 x 5.0 cm, most consistent with an unusually large appendiceal abscess. 2. The terminal loop of ileum adjacent to this collection is thickened appearing and very closely apposed, likely reactive to adjacent infection. 3. Moderate right hydronephrosis and hydroureter, the distal third of the right ureter appears effaced  and obstructed by overlying fluid collection. 4.  Cholelithiasis. 5.  Coronary artery disease.  Aortic Atherosclerosis (ICD10-I70.0). Electronically Signed   By: Eddie Candle M.D.   On: 09/20/2019 17:31     Assessment/Plan   Danielle Morris is a 64 y.o. female with medical history significant for hypertension, hyperlipidemia, who is admitted to [facility] on 09/20/2019 with ruptured appendix with associated abscess after presenting from home to [facility] Emergency Department complaining of abdominal pain.    Principal Problem:   Perforated appendix Active Problems:   Hypertension   Generalized anxiety disorder   Hyperlipemia   Tobacco abuse   Abdominal pain   Nausea and vomiting   #) Perforated appendix with abscess: Diagnosis on the basis of patient's report of 2 weeks of abdominal discomfort associated with nausea/vomiting, and presenting CT abdomen/pelvis showing evidence of large rim-enhancing fluid collection in the vicinity of the appendix, consistent with appendiceal abscess, in  the absence of any evidence of bowel obstruction/perforation. The patient's case and imaging were discussed with the on-call general surgeon, Dr. Constance Haw, who recommended admission to the hospitalist service at [facility], with plan for Dr. Constance Haw to evaluate the patient in the morning, at which time it would be determined if the patient would benefit from IR drainage. In the meantime, Dr. Constance Haw recommended that the patient be kept n.p.o. and be started on IV antibiotics.  SIRS criteria are not met at this time for the patient to be considered septic, and she appears to be hemodynamically stable.  Per my physical exam, abdominal evaluation is not associated with any peritoneal signs at this time.  Zosyn x1 dose administered in the ED this evening.  Of note, presenting INR 1.2.  Plan: General surgery consulted, with Dr. Constance Haw to evaluate the patient in the morning (09/21/19), as above, with interval recommendations for n.p.o. status as well as IV antibiotics.  Check blood cultures x2.  Continue IV Zosyn, which will provide anaerobic coverage.  Lactated Ringer's at 125 cc/h.  As needed IV morphine.  As needed IV Zofran.  EKG has been ordered for preoperative evaluation.  Repeat CBC with differential and CMP in the morning.  Add on serum magnesium level.  Refrain from pharmacologic DVT prophylaxis.  SCDs.     #) Acute on chronic hypoosmolar hyponatremia: Per chart review, it appears that the patient has chronic mild hyponatremia, with baseline serum sodium in the range of 1 31-1 36.  Per this evening's presenting labs, there appears to be a mild exacerbation of this chronic hyponatremia, with presenting serum sodium found to be 128 in the absence of any hyperglycemic adjustment required.  Suspect that this acute exacerbation is consistent with acute hypovolemic hypoosmolar hyponatremia in the setting of recent gastrointestinal losses in the form of nausea/vomiting as well as loose stool, as further  described above.  In terms of contributing factors leading to patient's chronic mild hyponatremia, potential contributing factors include chronic Zoloft therapy as well as potential reset osmo-stat.   Plan: Check urinalysis.  Check random urine sodium as well as random urine osmolality.  IV fluids, as above.  Monitor strict I's and O's and daily weights.  Repeat CMP in the morning.  will also check TSH.     #) Chronic tobacco abuse: The patient reports that she is a current smoker having smoked approximately half pack per day over the last 10 years.  Plan: Counseled the patient on the importance of complete smoking discontinuation.  I have also ordered a  as needed nicotine patch for use during this hospitalization.     #) Essential hypertension: On losartan as an outpatient.  Blood pressures noted to be normotensive since presenting to the ED today.  Plan: We will hold home losartan for now in the setting of presenting intra-abdominal infection as well as current n.p.o. status.  Close monitoring of ensuing blood pressures via routine vital signs.      #) Hyperlipidemia: On atorvastatin as an outpatient.  Plan: Hold him statin in the setting of current n.p.o. status, as above.     #) Generalized anxiety disorder: On Zoloft at home.  Plan: Hold home SSRI in the setting of current n.p.o. status.    DVT prophylaxis: scd's  Code Status: Full code Family Communication: none Disposition Plan: Per Rounding Team Consults called: Case and imaging were discussed with the on-call general surgeon, Dr. Constance Haw, as further described above. Admission status: Inpatient; med telemetry.    PLEASE NOTE THAT DRAGON DICTATION SOFTWARE WAS USED IN THE CONSTRUCTION OF THIS NOTE.   [facility] Pager [phone] From [place].   Otherwise, please contact night-coverage  [email or web address] Password [facility]  09/20/2019, 6:26 PM

## 2019-09-20 NOTE — ED Notes (Signed)
RN to room to give Zofran due to pt calling out for emesis. Pt refused stating she just wanted Ginger Ale.

## 2019-09-20 NOTE — ED Triage Notes (Signed)
States she has had diarrhea and denies vomiting. States she just feels bad.

## 2019-09-20 NOTE — ED Triage Notes (Signed)
C/o abdominal pain onset 2 weeks ago, also has sharp in her chest onset last night

## 2019-09-20 NOTE — ED Provider Notes (Signed)
[facility] Provider Note   CSN: [id number] Arrival date & time: 09/20/19  1424     History Chief Complaint  Patient presents with  . Abdominal Pain    Danielle Morris is a 64 y.o. female.  Patient complains of abdominal pain for 2 weeks with some diarrhea.  Some chills no fever  The history is provided by the patient, medical records and a relative. No language interpreter was used.  Abdominal Pain Pain location:  Generalized Pain radiates to:  Does not radiate Pain severity:  Moderate Onset quality:  Sudden Progression:  Worsening Chronicity:  New Context: not alcohol use   Relieved by:  Nothing Associated symptoms: diarrhea   Associated symptoms: no chest pain, no cough, no fatigue and no hematuria        Past Medical History:  Diagnosis Date  . GAD (generalized anxiety disorder)   . HLD (hyperlipidemia)   . Hypertension   . Panic attack   . Tobacco abuse     Patient Active Problem List   Diagnosis Date Noted  . HLD (hyperlipidemia)   . Tobacco abuse   . GAD (generalized anxiety disorder)   . Hyperlipemia 10/27/2017  . Generalized anxiety disorder 09/27/2013  . Panic disorder 09/27/2013  . Smoker 08/25/2013  . Hypertension     Past Surgical History:  Procedure Laterality Date  . COLONOSCOPY WITH PROPOFOL N/A 11/02/2013   Procedure: COLONOSCOPY WITH PROPOFOL;  Surgeon: Danie Binder, MD;  Location: [facility];  Service: Endoscopy;  Laterality: N/A;  in cecum @ 1002; cecal withdrawal time- minutes  . HEMORRHOID BANDING N/A 11/02/2013   Procedure: HEMORRHOID BANDING;  Surgeon: Danie Binder, MD;  Location: [facility];  Service: Endoscopy;  Laterality: N/A;  . HEMORRHOID SURGERY N/A 11/05/2013   Procedure: EXTENSIVE HEMORRHOIDECTOMY ;  Surgeon: Jamesetta So, MD;  Location: [facility];  Service: General;  Laterality: N/A;  . POLYPECTOMY N/A 11/02/2013   Procedure: POLYPECTOMY;  Surgeon: Danie Binder, MD;  Location: [facility];  Service: Endoscopy;   Laterality: N/A;  sigmoid colon, hepatic flexure, hyperplastic rectal, and rectal polyps  . TONSILLECTOMY     age  34     OB History    Gravida  3   Para  3   Term  3   Preterm      AB      Living  3     SAB      TAB      Ectopic      Multiple      Live Births              Family History  Problem Relation Age of Onset  . Heart disease Father 51       MI--H/O Rheumatic Fever as a child  . Hypertension Brother   . Hypertension Brother   . Diabetes Other     Social History   Tobacco Use  . Smoking status: Current Some Day Smoker    Packs/day: 0.50    Years: 10.00    Pack years: 5.00    Types: Cigarettes  . Smokeless tobacco: Never Used  . Tobacco comment: uses E-cigs  Substance Use Topics  . Alcohol use: No  . Drug use: No    Home Medications Prior to Admission medications   Medication Sig Start Date End Date Taking? Authorizing Provider  atorvastatin (LIPITOR) 40 MG tablet Take 1 tablet (40 mg total) by mouth daily. 07/28/19  Yes Jenna Luo  T, MD  docusate sodium (STOOL SOFTENER) 100 MG capsule Take 100 mg by mouth daily.   Yes [provider]  doxylamine, Sleep, (UNISOM) 25 MG tablet Take 25 mg by mouth at bedtime as needed for sleep.   Yes [provider]  losartan (COZAAR) 100 MG tablet TAKE 1 TABLET BY MOUTH DAILY Patient taking differently: Take 100 mg by mouth daily.  03/29/19  Yes [place], Modena Nunnery, MD  sertraline (ZOLOFT) 50 MG tablet TAKE 1 TABLET(50 MG) BY MOUTH DAILY Patient taking differently: Take 50 mg by mouth daily.  06/21/19  Yes Susy Frizzle, MD    Allergies    Amlodipine, Tramadol, Hydrocodone, Lisinopril, and Chantix [varenicline]  Review of Systems   Review of Systems  Constitutional: Negative for appetite change and fatigue.  HENT: Negative for congestion, ear discharge and sinus pressure.   Eyes: Negative for discharge.  Respiratory: Negative for cough.   Cardiovascular: Negative for chest  pain.  Gastrointestinal: Positive for abdominal pain and diarrhea.  Genitourinary: Negative for frequency and hematuria.  Musculoskeletal: Negative for back pain.  Skin: Negative for rash.  Neurological: Negative for seizures and headaches.  Psychiatric/Behavioral: Negative for hallucinations.    Physical Exam Updated Vital Signs BP 122/77   Pulse (!) 59   Temp 98.8 F (37.1 C)   Resp 13   Ht 5\' 7"  (1.702 m)   Wt 54.4 kg   SpO2 100%   BMI 18.79 kg/m   Physical Exam Vitals and nursing note reviewed.  Constitutional:      Appearance: She is well-developed.  HENT:     Head: Normocephalic.     Nose: Nose normal.  Eyes:     General: No scleral icterus.    Conjunctiva/sclera: Conjunctivae normal.  Neck:     Thyroid: No thyromegaly.  Cardiovascular:     Rate and Rhythm: Normal rate and regular rhythm.     Heart sounds: No murmur. No friction rub. No gallop.   Pulmonary:     Breath sounds: No stridor. No wheezing or rales.  Chest:     Chest wall: No tenderness.  Abdominal:     General: There is no distension.     Tenderness: There is abdominal tenderness. There is no rebound.  Musculoskeletal:        General: Normal range of motion.     Cervical back: Neck supple.  Lymphadenopathy:     Cervical: No cervical adenopathy.  Skin:    Findings: No erythema or rash.  Neurological:     Mental Status: She is alert and oriented to person, place, and time.     Motor: No abnormal muscle tone.     Coordination: Coordination normal.  Psychiatric:        Behavior: Behavior normal.     ED Results / Procedures / Treatments   Labs (all labs ordered are listed, but only abnormal results are displayed) Labs Reviewed  CBC WITH DIFFERENTIAL/PLATELET - Abnormal; Notable for the following components:      Result Value   WBC 11.4 (*)    Platelets 436 (*)    Neutro Abs 9.7 (*)    Abs Immature Granulocytes 0.08 (*)    All other components within normal limits  COMPREHENSIVE  METABOLIC PANEL - Abnormal; Notable for the following components:   Sodium 128 (*)    Chloride 94 (*)    Glucose, Bld 100 (*)    Total Protein 8.4 (*)    Albumin 3.3 (*)    All  other components within normal limits  RESPIRATORY PANEL BY RT PCR (FLU A&B, COVID)  LIPASE, BLOOD  PROTIME-INR  URINALYSIS, ROUTINE W REFLEX MICROSCOPIC    EKG None  Radiology CT ABDOMEN PELVIS W CONTRAST  Result Date: 09/20/2019 CLINICAL DATA:  Abdominal pain for 2 weeks, neutropenia EXAM: CT ABDOMEN AND PELVIS WITH CONTRAST TECHNIQUE: Multidetector CT imaging of the abdomen and pelvis was performed using the standard protocol following bolus administration of intravenous contrast. CONTRAST:  158mL OMNIPAQUE IOHEXOL 300 MG/ML  SOLN COMPARISON:  None. FINDINGS: Lower chest: No acute abnormality.  Coronary artery disease. Hepatobiliary: No solid liver abnormality is seen. Rim calcified gallstone in the gallbladder. No gallbladder wall thickening, or biliary dilatation. Pancreas: Unremarkable. No pancreatic ductal dilatation or surrounding inflammatory changes. Spleen: Normal in size without significant abnormality. Adrenals/Urinary Tract: Adrenal glands are unremarkable. Moderate right hydronephrosis and hydroureter, the distal third of the right ureter appears effaced and obstructed by overlying fluid collection. The left kidney is normal. Bladder is unremarkable. Stomach/Bowel: Stomach is within normal limits. There is a large, rim enhancing fluid collection in the expected vicinity of the appendix measuring 9.5 x 4.5 x 5.0 cm (series 2, image 62). There appears to be a dependent calcified appendicolith. This collection is very closely apposed to the terminal loop of ileum, which is thickened appearing. No evidence of bowel wall thickening, distention, or inflammatory changes. Vascular/Lymphatic: Aortic atherosclerosis. No enlarged abdominal or pelvic lymph nodes. Reproductive: No mass or other significant abnormality.  Other: No abdominal wall hernia or abnormality. Trace fluid in the right lower quadrant. Musculoskeletal: No acute or significant osseous findings. IMPRESSION: 1. There is a large, rim enhancing fluid collection in the expected vicinity of the appendix measuring 9.5 x 4.5 x 5.0 cm, most consistent with an unusually large appendiceal abscess. 2. The terminal loop of ileum adjacent to this collection is thickened appearing and very closely apposed, likely reactive to adjacent infection. 3. Moderate right hydronephrosis and hydroureter, the distal third of the right ureter appears effaced and obstructed by overlying fluid collection. 4.  Cholelithiasis. 5.  Coronary artery disease.  Aortic Atherosclerosis (ICD10-I70.0). Electronically Signed   By: Eddie Candle M.D.   On: 09/20/2019 17:31    Procedures Procedures (including critical care time)  Medications Ordered in ED Medications  ondansetron (ZOFRAN) injection 4 mg (4 mg Intravenous Refused 09/20/19 2001)  sodium chloride 0.9 % bolus 1,000 mL (0 mLs Intravenous Stopped 09/20/19 2010)  ondansetron (ZOFRAN) injection 4 mg (4 mg Intravenous Given 09/20/19 1603)  morphine 2 MG/ML injection 2 mg (2 mg Intravenous Given 09/20/19 1603)  iohexol (OMNIPAQUE) 300 MG/ML solution 100 mL (100 mLs Intravenous Contrast Given 09/20/19 1700)  HYDROmorphone (DILAUDID) injection 1 mg (1 mg Intravenous Given 09/20/19 1853)  ondansetron (ZOFRAN) injection 4 mg (4 mg Intravenous Given 09/20/19 1853)  piperacillin-tazobactam (ZOSYN) IVPB 3.375 g (0 g Intravenous Stopped 09/20/19 2010)    ED Course  I have reviewed the triage vital signs and the nursing notes.  Pertinent labs & imaging results that were available during my care of the patient were reviewed by me and considered in my medical decision making (see chart for details).    MDM Rules/Calculators/A&P                      CT scan shows ruptured appendix with abscess.  I spoke with general surgery Dr. Constance Haw and  she states to have medicine admit with antibiotics and she will see the patient tomorrow.  Interventional radiology may drain the abscess tomorrow   CRITICAL CARE Performed by: Milton Ferguson Total critical care time: 40 minutes Critical care time was exclusive of separately billable procedures and treating other patients. Critical care was necessary to treat or prevent imminent or life-threatening deterioration. Critical care was time spent personally by me on the following activities: development of treatment plan with patient and/or surrogate as well as nursing, discussions with consultants, evaluation of patient's response to treatment, examination of patient, obtaining history from patient or surrogate, ordering and performing treatments and interventions, ordering and review of laboratory studies, ordering and review of radiographic studies, pulse oximetry and re-evaluation of patient's condition.\  Final Clinical Impression(s) / ED Diagnoses Final diagnoses:  None    This patient presents to the ED for concern of abdominal pain, this involves an extensive number of treatment options, and is a complaint that carries with it a high risk of complications and morbidity.  The differential diagnosis includes appendicitis colitis   Lab Tests:  I Ordered, reviewed, and interpreted labs, which included CBC chemistry which showed hyponatremia and leukocytosis Medicines ordered:   I ordered medication Dilaudid and Zofran for nausea and vomiting  Imaging Studies ordered:   I ordered imaging studies which included CT scan of the and  I independently visualized and interpreted imaging which showed large abscess near the appendix  Additional history obtained:   Additional history obtained from relative  Previous records obtained and reviewed  Consultations Obtained:   I consulted general surgery and discussed lab and imaging findings  Reevaluation:  After the interventions  stated above, I reevaluated the patient and found patient pain medicines and fluids help with her pain.  She has a large abscess of the abdomen and she has been admitted by medicine start on antibiotics  Critical Interventions:  .   Rx / DC Orders ED Discharge Orders    None       Milton Ferguson, MD 09/20/19 2019

## 2019-09-20 NOTE — Progress Notes (Signed)
[facility]  Perforated appendix with abscess. Needs IV antibiotics, NPO midnight, INR now, IR drainage. Hospitalist admission.   Will see tomorrow. Curlene Labrum, MD

## 2019-09-21 ENCOUNTER — Inpatient Hospital Stay (HOSPITAL_COMMUNITY): Payer: BC Managed Care – PPO

## 2019-09-21 DIAGNOSIS — E782 Mixed hyperlipidemia: Secondary | ICD-10-CM | POA: Diagnosis not present

## 2019-09-21 DIAGNOSIS — E871 Hypo-osmolality and hyponatremia: Secondary | ICD-10-CM | POA: Diagnosis not present

## 2019-09-21 DIAGNOSIS — F411 Generalized anxiety disorder: Secondary | ICD-10-CM | POA: Diagnosis not present

## 2019-09-21 DIAGNOSIS — I340 Nonrheumatic mitral (valve) insufficiency: Secondary | ICD-10-CM | POA: Diagnosis not present

## 2019-09-21 DIAGNOSIS — Z20822 Contact with and (suspected) exposure to covid-19: Secondary | ICD-10-CM | POA: Diagnosis not present

## 2019-09-21 DIAGNOSIS — N131 Hydronephrosis with ureteral stricture, not elsewhere classified: Secondary | ICD-10-CM | POA: Diagnosis not present

## 2019-09-21 DIAGNOSIS — K3532 Acute appendicitis with perforation and localized peritonitis, without abscess: Secondary | ICD-10-CM | POA: Diagnosis not present

## 2019-09-21 DIAGNOSIS — K3533 Acute appendicitis with perforation and localized peritonitis, with abscess: Secondary | ICD-10-CM | POA: Diagnosis not present

## 2019-09-21 DIAGNOSIS — I10 Essential (primary) hypertension: Secondary | ICD-10-CM | POA: Diagnosis not present

## 2019-09-21 LAB — CBC WITH DIFFERENTIAL/PLATELET
Abs Immature Granulocytes: 0.05 10*3/uL (ref 0.00–0.07)
Basophils Absolute: 0 10*3/uL (ref 0.0–0.1)
Basophils Relative: 0 %
Eosinophils Absolute: 0.1 10*3/uL (ref 0.0–0.5)
Eosinophils Relative: 2 %
HCT: 32.6 % — ABNORMAL LOW (ref 36.0–46.0)
Hemoglobin: 10.5 g/dL — ABNORMAL LOW (ref 12.0–15.0)
Immature Granulocytes: 1 %
Lymphocytes Relative: 19 %
Lymphs Abs: 1.3 10*3/uL (ref 0.7–4.0)
MCH: 30.1 pg (ref 26.0–34.0)
MCHC: 32.2 g/dL (ref 30.0–36.0)
MCV: 93.4 fL (ref 80.0–100.0)
Monocytes Absolute: 0.4 10*3/uL (ref 0.1–1.0)
Monocytes Relative: 6 %
Neutro Abs: 5 10*3/uL (ref 1.7–7.7)
Neutrophils Relative %: 72 %
Platelets: 366 10*3/uL (ref 150–400)
RBC: 3.49 MIL/uL — ABNORMAL LOW (ref 3.87–5.11)
RDW: 13.1 % (ref 11.5–15.5)
WBC: 6.8 10*3/uL (ref 4.0–10.5)
nRBC: 0 % (ref 0.0–0.2)

## 2019-09-21 LAB — COMPREHENSIVE METABOLIC PANEL
ALT: 22 U/L (ref 0–44)
AST: 16 U/L (ref 15–41)
Albumin: 2.7 g/dL — ABNORMAL LOW (ref 3.5–5.0)
Alkaline Phosphatase: 65 U/L (ref 38–126)
Anion gap: 10 (ref 5–15)
BUN: 10 mg/dL (ref 8–23)
CO2: 23 mmol/L (ref 22–32)
Calcium: 8.7 mg/dL — ABNORMAL LOW (ref 8.9–10.3)
Chloride: 101 mmol/L (ref 98–111)
Creatinine, Ser: 0.73 mg/dL (ref 0.44–1.00)
GFR calc Af Amer: >60 mL/min (ref >60)
GFR calc non Af Amer: >60 mL/min (ref >60)
Glucose, Bld: 89 mg/dL (ref 70–99)
Potassium: 4 mmol/L (ref 3.5–5.1)
Sodium: 134 mmol/L — ABNORMAL LOW (ref 135–145)
Total Bilirubin: 0.7 mg/dL (ref 0.3–1.2)
Total Protein: 6.9 g/dL (ref 6.5–8.1)

## 2019-09-21 LAB — TSH: TSH: 0.695 u[IU]/mL (ref 0.350–4.500)

## 2019-09-21 LAB — OSMOLALITY, URINE: Osmolality, Ur: 169 mOsm/kg — ABNORMAL LOW (ref 300–900)

## 2019-09-21 LAB — ECHOCARDIOGRAM COMPLETE
Height: 67 in
Weight: 1932.99 oz

## 2019-09-21 LAB — HIV ANTIBODY (ROUTINE TESTING W REFLEX): HIV Screen 4th Generation wRfx: NONREACTIVE

## 2019-09-21 LAB — MAGNESIUM: Magnesium: 2.1 mg/dL (ref 1.7–2.4)

## 2019-09-21 MED ORDER — LORAZEPAM 2 MG/ML IJ SOLN
1.0000 mg | Freq: Once | INTRAMUSCULAR | Status: AC
  Administered 2019-09-21: 1 mg via INTRAVENOUS
  Filled 2019-09-21: qty 1

## 2019-09-21 MED ORDER — BOOST / RESOURCE BREEZE PO LIQD CUSTOM
1.0000 | Freq: Three times a day (TID) | ORAL | Status: DC
  Administered 2019-09-24 (×2): 1 via ORAL

## 2019-09-21 MED ORDER — DIPHENHYDRAMINE HCL 25 MG PO CAPS
25.0000 mg | ORAL_CAPSULE | Freq: Every evening | ORAL | Status: DC | PRN
  Administered 2019-09-23 (×2): 25 mg via ORAL
  Filled 2019-09-21 (×2): qty 1

## 2019-09-21 NOTE — Progress Notes (Signed)
*  PRELIMINARY RESULTS* Echocardiogram 2D Echocardiogram has been performed.  Leavy Cella 09/21/2019, 12:04 PM

## 2019-09-21 NOTE — Consult Note (Signed)
[facility] Consult  Reason for Consult: Perforated appendicitis  Referring Physician:  Dr. Carles Collet   Chief Complaint    Abdominal Pain      HPI: Danielle Morris is a 64 y.o. female with a history of 2-3 weeks of lower abdominal pain radiating to her right side and to her back. She says that she had some associated nausea and vomiting but overall has just felt poorly. She has been having some diarrhea but no reported rectal bleeding. She says that she was worried about having COVID and did not know what to think about feeling bad for so long. She denies any fevers but has had some chills.  She has some anxiety at baseline.  She had a prior colonoscopy in 2015 with Dr. Oneida Alar for rectal bleeding and had 8 colorectal polyps removed. She had a recommendation of followup in 3 years but has not had this completed to date.    Past Medical History:  Diagnosis Date  . GAD (generalized anxiety disorder)   . HLD (hyperlipidemia)   . Hypertension   . Panic attack   . Tobacco abuse     Past Surgical History:  Procedure Laterality Date  . COLONOSCOPY WITH PROPOFOL N/A 11/02/2013   Procedure: COLONOSCOPY WITH PROPOFOL;  Surgeon: Danie Binder, MD;  Location: [facility];  Service: Endoscopy;  Laterality: N/A;  in cecum @ 1002; cecal withdrawal time- minutes  . HEMORRHOID BANDING N/A 11/02/2013   Procedure: HEMORRHOID BANDING;  Surgeon: Danie Binder, MD;  Location: [facility];  Service: Endoscopy;  Laterality: N/A;  . HEMORRHOID SURGERY N/A 11/05/2013   Procedure: EXTENSIVE HEMORRHOIDECTOMY ;  Surgeon: Jamesetta So, MD;  Location: [facility];  Service: General;  Laterality: N/A;  . POLYPECTOMY N/A 11/02/2013   Procedure: POLYPECTOMY;  Surgeon: Danie Binder, MD;  Location: [facility];  Service: Endoscopy;  Laterality: N/A;  sigmoid colon, hepatic flexure, hyperplastic rectal, and rectal polyps  . TONSILLECTOMY     age  54    Family History  Problem Relation Age of Onset  . Heart disease Father 45        MI--H/O Rheumatic Fever as a child  . Hypertension Brother   . Hypertension Brother   . Diabetes Other     Social History   Tobacco Use  . Smoking status: Current Some Day Smoker    Packs/day: 0.50    Years: 10.00    Pack years: 5.00    Types: Cigarettes  . Smokeless tobacco: Never Used  . Tobacco comment: uses E-cigs  Substance Use Topics  . Alcohol use: No  . Drug use: No    Medications:  I have reviewed the patient's current medications. Prior to Admission:  Medications Prior to Admission  Medication Sig Dispense Refill Last Dose  . atorvastatin (LIPITOR) 40 MG tablet Take 1 tablet (40 mg total) by mouth daily. 90 tablet 3 Past Month at Unknown time  . docusate sodium (STOOL SOFTENER) 100 MG capsule Take 100 mg by mouth daily.   09/19/2019 at Unknown time  . doxylamine, Sleep, (UNISOM) 25 MG tablet Take 25 mg by mouth at bedtime as needed for sleep.   09/19/2019 at Unknown time  . losartan (COZAAR) 100 MG tablet TAKE 1 TABLET BY MOUTH DAILY (Patient taking differently: Take 100 mg by mouth daily. ) 30 tablet 5 09/20/2019 at Unknown time  . sertraline (ZOLOFT) 50 MG tablet TAKE 1 TABLET(50 MG) BY MOUTH DAILY (Patient taking differently: Take 50 mg by  mouth daily. ) 90 tablet 0 Past Week at Unknown time   Scheduled: . sodium chloride flush  3 mL Intravenous Q12H   Continuous: . lactated ringers 125 mL/hr at 09/20/19 2326  . piperacillin-tazobactam (ZOSYN)  IV 3.375 g (09/21/19 1225)   [id number] injection, nicotine, ondansetron (ZOFRAN) IV, promethazine  Allergies  Allergen Reactions  . Amlodipine Shortness Of Breath, Anxiety and Palpitations  . Tramadol     Rushes thru body, miserable, shaking and crying  . Hydrocodone Nausea And Vomiting  . Lisinopril Other (See Comments)    unknown  . Chantix [Varenicline] Nausea Only and Anxiety     ROS:  A comprehensive review of systems was negative except for: Constitutional: positive for chills and  malaise Gastrointestinal: positive for abdominal pain, diarrhea and nausea  Blood pressure 100/69, pulse (!) 59, temperature 97.8 F (36.6 C), temperature source Oral, resp. rate 18, height 5\' 7"  (1.702 m), weight 54.8 kg, SpO2 99 %. Physical Exam Vitals reviewed.  Constitutional:      Appearance: She is normal weight.  HENT:     Head: Normocephalic.  Eyes:     Extraocular Movements: Extraocular movements intact.  Cardiovascular:     Rate and Rhythm: Normal rate.  Pulmonary:     Effort: Pulmonary effort is normal.  Abdominal:     General: Abdomen is flat. There is no distension.     Palpations: Abdomen is soft.     Tenderness: There is abdominal tenderness in the right lower quadrant and suprapubic area. There is no guarding or rebound.  Musculoskeletal:     Comments: Moves all extremities   Skin:    General: Skin is warm and dry.  Neurological:     General: No focal deficit present.     Mental Status: She is alert and oriented to person, place, and time.  Psychiatric:        Mood and Affect: Mood normal.        Behavior: Behavior normal.     Results: Results for orders placed or performed during the hospital encounter of 09/20/19 (from the past 48 hour(s))  CBC with Differential/Platelet     Status: Abnormal   Collection Time: 09/20/19  3:58 PM  Result Value Ref Range   WBC 11.4 (H) 4.0 - 10.5 K/uL   RBC 4.07 3.87 - 5.11 MIL/uL   Hemoglobin 12.4 12.0 - 15.0 g/dL   HCT 36.8 36.0 - 46.0 %   MCV 90.4 80.0 - 100.0 fL   MCH 30.5 26.0 - 34.0 pg   MCHC 33.7 30.0 - 36.0 g/dL   RDW 13.0 11.5 - 15.5 %   Platelets 436 (H) 150 - 400 K/uL   nRBC 0.0 0.0 - 0.2 %   Neutrophils Relative % 85 %   Neutro Abs 9.7 (H) 1.7 - 7.7 K/uL   Lymphocytes Relative 9 %   Lymphs Abs 1.0 0.7 - 4.0 K/uL   Monocytes Relative 5 %   Monocytes Absolute 0.6 0.1 - 1.0 K/uL   Eosinophils Relative 0 %   Eosinophils Absolute 0.0 0.0 - 0.5 K/uL   Basophils Relative 0 %   Basophils Absolute 0.0 0.0 -  0.1 K/uL   Immature Granulocytes 1 %   Abs Immature Granulocytes 0.08 (H) 0.00 - 0.07 K/uL    Comment: Performed at [facility], [address], [place], [place] [address]  Comprehensive metabolic panel     Status: Abnormal   Collection Time: 09/20/19  3:58 PM  Result Value Ref Range  Sodium 128 (L) 135 - 145 mmol/L   Potassium 3.6 3.5 - 5.1 mmol/L   Chloride 94 (L) 98 - 111 mmol/L   CO2 22 22 - 32 mmol/L   Glucose, Bld 100 (H) 70 - 99 mg/dL    Comment: Glucose reference range applies only to samples taken after fasting for at least 8 hours.   BUN 10 8 - 23 mg/dL   Creatinine, Ser 0.74 0.44 - 1.00 mg/dL   Calcium 9.0 8.9 - 10.3 mg/dL   Total Protein 8.4 (H) 6.5 - 8.1 g/dL   Albumin 3.3 (L) 3.5 - 5.0 g/dL   AST 24 15 - 41 U/L   ALT 29 0 - 44 U/L   Alkaline Phosphatase 83 38 - 126 U/L   Total Bilirubin 0.7 0.3 - 1.2 mg/dL   GFR calc non Af Amer >60 >60 mL/min   GFR calc Af Amer >60 >60 mL/min   Anion gap 12 5 - 15    Comment: Performed at [facility], [address], [place], [place] [address]  Lipase, blood     Status: None   Collection Time: 09/20/19  3:58 PM  Result Value Ref Range   Lipase 35 11 - 51 U/L    Comment: Performed at [facility] [place], [address], [place], [place] [address]  Protime-INR     Status: None   Collection Time: 09/20/19  3:58 PM  Result Value Ref Range   Prothrombin Time 14.9 11.4 - 15.2 seconds   INR 1.2 0.8 - 1.2    Comment: (NOTE) INR goal varies based on device and disease states. Performed at [facility], [address], [place], [place] [address]   Magnesium     Status: None   Collection Time: 09/20/19  3:58 PM  Result Value Ref Range   Magnesium 2.1 1.7 - 2.4 mg/dL    Comment: Performed at [facility], [address], [place], [place] [address]  Respiratory Panel by RT PCR (Flu A&B, Covid) - Nasopharyngeal Swab     Status: None   Collection Time: 09/20/19  8:10 PM   Specimen: Nasopharyngeal Swab  Result Value Ref Range   SARS Coronavirus  2 by RT PCR NEGATIVE NEGATIVE    Comment: (NOTE) SARS-CoV-2 target nucleic acids are NOT DETECTED. The SARS-CoV-2 RNA is generally detectable in upper respiratoy specimens during the acute phase of infection. The lowest concentration of SARS-CoV-2 viral copies this assay can detect is 131 copies/mL. A negative result does not preclude SARS-Cov-2 infection and should not be used as the sole basis for treatment or other patient management decisions. A negative result may occur with  improper specimen collection/handling, submission of specimen other than nasopharyngeal swab, presence of viral mutation(s) within the areas targeted by this assay, and inadequate number of viral copies (<131 copies/mL). A negative result must be combined with clinical observations, patient history, and epidemiological information. The expected result is Negative. Fact Sheet for Patients:  PinkCheek.be Fact Sheet for Healthcare Providers:  GravelBags.it This Morris is not yet ap proved or cleared by the [place] FDA and  has been authorized for detection and/or diagnosis of SARS-CoV-2 by FDA under an Emergency Use Authorization (EUA). This EUA will remain  in effect (meaning this Morris can be used) for the duration of the COVID-19 declaration under Section 564(b)(1) of the Act, 21 U.S.C. section 360bbb-3(b)(1), unless the authorization is terminated or revoked sooner.    Influenza A by PCR NEGATIVE NEGATIVE   Influenza B by PCR  NEGATIVE NEGATIVE    Comment: (NOTE) The Xpert Xpress SARS-CoV-2/FLU/RSV assay is intended as an aid in  the diagnosis of influenza from Nasopharyngeal swab specimens and  should not be used as a sole basis for treatment. Nasal washings and  aspirates are unacceptable for Xpert Xpress SARS-CoV-2/FLU/RSV  testing. Fact Sheet for Patients: PinkCheek.be Fact Sheet for Healthcare  Providers: GravelBags.it This Morris is not yet approved or cleared by the [place] FDA and  has been authorized for detection and/or diagnosis of SARS-CoV-2 by  FDA under an Emergency Use Authorization (EUA). This EUA will remain  in effect (meaning this Morris can be used) for the duration of the  Covid-19 declaration under Section 564(b)(1) of the Act, 21  U.S.C. section 360bbb-3(b)(1), unless the authorization is  terminated or revoked. Performed at [facility], [address], [place], [place] [address]   Urinalysis, Routine w reflex microscopic     Status: Abnormal   Collection Time: 09/20/19  9:25 PM  Result Value Ref Range   Color, Urine YELLOW YELLOW   APPearance CLEAR CLEAR   Specific Gravity, Urine 1.015 1.005 - 1.030   pH 6.0 5.0 - 8.0   Glucose, UA NEGATIVE NEGATIVE mg/dL   Hgb urine dipstick SMALL (A) NEGATIVE   Bilirubin Urine NEGATIVE NEGATIVE   Ketones, ur NEGATIVE NEGATIVE mg/dL   Protein, ur NEGATIVE NEGATIVE mg/dL   Nitrite NEGATIVE NEGATIVE   Leukocytes,Ua NEGATIVE NEGATIVE   RBC / HPF 0-5 0 - 5 RBC/hpf   WBC, UA 0-5 0 - 5 WBC/hpf   Bacteria, UA NONE SEEN NONE SEEN   Squamous Epithelial / LPF 0-5 0 - 5   Mucus PRESENT     Comment: Performed at [facility], [address] [place], [place] [address]  Sodium, urine, random     Status: None   Collection Time: 09/20/19  9:28 PM  Result Value Ref Range   Sodium, Ur 13 mmol/L    Comment: Performed at [facility], [address], [place], [place] [address]  Creatinine, urine, random     Status: None   Collection Time: 09/20/19  9:28 PM  Result Value Ref Range   Creatinine, Urine 43.73 mg/dL    Comment: Performed at [facility], [address] [place], [place] [address]  Culture, blood (Routine X 2) w Reflex to ID Panel     Status: None (Preliminary result)   Collection Time: 09/20/19  9:39 PM   Specimen: Left Antecubital; Blood  Result Value Ref Range   Specimen Description LEFT  ANTECUBITAL    Special Requests      BOTTLES DRAWN AEROBIC AND ANAEROBIC Blood Culture adequate volume Performed at [facility], [address], [place], [place] [address]    Culture PENDING    Report Status PENDING   Culture, blood (Routine X 2) w Reflex to ID Panel     Status: None (Preliminary result)   Collection Time: 09/20/19  9:41 PM   Specimen: BLOOD RIGHT ARM  Result Value Ref Range   Specimen Description BLOOD RIGHT ARM    Special Requests      BOTTLES DRAWN AEROBIC AND ANAEROBIC Blood Culture adequate volume Performed at [facility], [address], [place],  [address]    Culture PENDING    Report Status PENDING   Magnesium     Status: None   Collection Time: 09/21/19  4:21 AM  Result Value Ref Range   Magnesium 2.1 1.7 - 2.4 mg/dL    Comment: Performed at  [address] [place], [place] [address]  Comprehensive metabolic panel     Status: Abnormal   Collection Time: 09/21/19  4:21 AM  Result Value Ref Range   Sodium 134 (L) 135 - 145 mmol/L   Potassium 4.0 3.5 - 5.1 mmol/L   Chloride 101 98 - 111 mmol/L   CO2 23 22 - 32 mmol/L   Glucose, Bld 89 70 - 99 mg/dL    Comment: Glucose reference range applies only to samples taken after fasting for at least 8 hours.   BUN 10 8 - 23 mg/dL   Creatinine, Ser 0.73 0.44 - 1.00 mg/dL   Calcium 8.7 (L) 8.9 - 10.3 mg/dL   Total Protein 6.9 6.5 - 8.1 g/dL   Albumin 2.7 (L) 3.5 - 5.0 g/dL   AST 16 15 - 41 U/L   ALT 22 0 - 44 U/L   Alkaline Phosphatase 65 38 - 126 U/L   Total Bilirubin 0.7 0.3 - 1.2 mg/dL   GFR calc non Af Amer >60 >60 mL/min   GFR calc Af Amer >60 >60 mL/min   Anion gap 10 5 - 15    Comment: Performed at [facility], [address], [place], [place] [address]  TSH     Status: None   Collection Time: 09/21/19  4:21 AM  Result Value Ref Range   TSH 0.695 0.350 - 4.500 uIU/mL    Comment: Performed by a 3rd Generation assay with a functional sensitivity of <=0.01 uIU/mL. Performed at [facility], [address] [place], [place] [address]   CBC WITH DIFFERENTIAL     Status: Abnormal   Collection Time: 09/21/19  4:21 AM  Result Value Ref Range   WBC 6.8 4.0 - 10.5 K/uL   RBC 3.49 (L) 3.87 - 5.11 MIL/uL   Hemoglobin 10.5 (L) 12.0 - 15.0 g/dL   HCT 32.6 (L) 36.0 - 46.0 %   MCV 93.4 80.0 - 100.0 fL   MCH 30.1 26.0 - 34.0 pg   MCHC 32.2 30.0 - 36.0 g/dL   RDW 13.1 11.5 - 15.5 %   Platelets 366 150 - 400 K/uL   nRBC 0.0 0.0 - 0.2 %   Neutrophils Relative % 72 %   Neutro Abs 5.0 1.7 - 7.7 K/uL   Lymphocytes Relative 19 %   Lymphs Abs 1.3 0.7 - 4.0 K/uL   Monocytes Relative 6 %   Monocytes Absolute 0.4 0.1 - 1.0 K/uL   Eosinophils Relative 2 %   Eosinophils Absolute 0.1 0.0 - 0.5 K/uL   Basophils Relative 0 %   Basophils Absolute 0.0 0.0 - 0.1 K/uL   Immature Granulocytes 1 %   Abs Immature Granulocytes 0.05 0.00 - 0.07 K/uL    Comment: Performed at [facility], [address] [place], [place] [address]   Personally reviewed- rim enhancing collection and appendix with possible appendicolith  CT ABDOMEN PELVIS W CONTRAST  Result Date: 09/20/2019 CLINICAL DATA:  Abdominal pain for 2 weeks, neutropenia EXAM: CT ABDOMEN AND PELVIS WITH CONTRAST TECHNIQUE: Multidetector CT imaging of the abdomen and pelvis was performed using the standard protocol following bolus administration of intravenous contrast. CONTRAST:  174mL OMNIPAQUE IOHEXOL 300 MG/ML  SOLN COMPARISON:  None. FINDINGS: Lower chest: No acute abnormality.  Coronary artery disease. Hepatobiliary: No solid liver abnormality is seen. Rim calcified gallstone in the gallbladder. No gallbladder wall thickening, or biliary dilatation. Pancreas: Unremarkable. No pancreatic ductal dilatation or surrounding inflammatory changes. Spleen: Normal in size  without significant abnormality. Adrenals/Urinary Tract: Adrenal glands are unremarkable. Moderate right hydronephrosis and hydroureter, the distal third of the right ureter appears  effaced and obstructed by overlying fluid collection. The left kidney is normal. Bladder is unremarkable. Stomach/Bowel: Stomach is within normal limits. There is a large, rim enhancing fluid collection in the expected vicinity of the appendix measuring 9.5 x 4.5 x 5.0 cm (series 2, image 62). There appears to be a dependent calcified appendicolith. This collection is very closely apposed to the terminal loop of ileum, which is thickened appearing. No evidence of bowel wall thickening, distention, or inflammatory changes. Vascular/Lymphatic: Aortic atherosclerosis. No enlarged abdominal or pelvic lymph nodes. Reproductive: No mass or other significant abnormality. Other: No abdominal wall hernia or abnormality. Trace fluid in the right lower quadrant. Musculoskeletal: No acute or significant osseous findings. IMPRESSION: 1. There is a large, rim enhancing fluid collection in the expected vicinity of the appendix measuring 9.5 x 4.5 x 5.0 cm, most consistent with an unusually large appendiceal abscess. 2. The terminal loop of ileum adjacent to this collection is thickened appearing and very closely apposed, likely reactive to adjacent infection. 3. Moderate right hydronephrosis and hydroureter, the distal third of the right ureter appears effaced and obstructed by overlying fluid collection. 4.  Cholelithiasis. 5.  Coronary artery disease.  Aortic Atherosclerosis (ICD10-I70.0). Electronically Signed   By: Eddie Candle M.D.   On: 09/20/2019 17:31    Assessment & Plan:  Danielle Morris is a 64 y.o. female with what appears to be perforated appendicitis with appendicolith. She has had pain in the last 2-3 weeks. She has been admitted for IV antibiotics and IR drainage if IR is able to access this collection.  Discussed with patient and her husband that we think this is from ruptured appendicitis but that this could be related to inflammatory bowel disease versus something related to her colorectal polyps/ cancer given  her history.   -IV antibiotics  -IR to see if they can drain  Discussed that it can take several weeks of the drain and repeat imaging. Discussed need for follow up colonoscopy. Discussed eventual need for appendectomy in 8 weeks+ given her perforation and appendicolith.    Can have clears if IR unable to do proceed today.   All questions were answered to the satisfaction of the patient and family. Discussed with Dr. Carles Collet.   Virl Cagey 09/21/2019, 8:08 AM

## 2019-09-21 NOTE — Progress Notes (Signed)
Initial Nutrition Assessment  DOCUMENTATION CODES:      INTERVENTION:  Boost Breeze po TID, each supplement provides 250 kcal and 9 grams of protein    NUTRITION DIAGNOSIS:   Increased nutrient needs related to acute illness(intra-abdominal abcess) as evidenced by estimated needs.   GOAL:   Patient will meet greater than or equal to 90% of their needs   MONITOR:   Diet advancement, PO intake, Labs, Weight trends, Skin, Supplement acceptance  REASON FOR ASSESSMENT:   Malnutrition Screening Tool    ASSESSMENT: Patient is a 64 yo female with hx of GERD, HTN, HLD and tobacco abuse.  Presents with abdominal pain and found to have an appendicial abscess. Scheduled for image-guided aspiration/drainage by IR tomorrow at [facility]    Patient tolerating clears-240 ml per chart. No emesis reported.   Weight-54.8 kg reflects a loss of 5% x 2 months. History of wt loss of 27.6 kg (~33%) between 04/2018 to 03/25/2019.   Labs reviewed: Sodium 134, Alb. 2.7, Hgb 10.5.  NUTRITION - FOCUSED PHYSICAL EXAM: RD working remotely.   Diet Order:   Diet Order            Diet clear liquid Room service appropriate? Yes; Fluid consistency: Thin  Diet effective now              EDUCATION NEEDS:   Not appropriate for education at this time  Skin:  Skin Assessment: Reviewed RN Assessment  Last BM:  4/11  Height:   Ht Readings from Last 1 Encounters:  09/20/19 5\' 7"  (1.702 m)    Weight:   Wt Readings from Last 1 Encounters:  09/20/19 54.8 kg    Ideal Body Weight:   61 kg   BMI:  Body mass index is 18.92 kg/m.  Estimated Nutritional Needs:   Kcal:  [id number]  Protein:  77-87 gr  Fluid:  >1500 ml daily   Colman Cater MS,RD,CSG,LDN Contact through [employer]

## 2019-09-21 NOTE — Progress Notes (Addendum)
PROGRESS NOTE  Danielle Morris [id number] DOB: 04-14-1956 DOA: 09/20/2019 PCP: Susy Frizzle, MD  Brief History:  64 year old female with a history of hypertension, hyperlipidemia, tobacco abuse, GERD presenting with 2-week history of generalized abdominal pain, worsening in the right lower quadrant.  She describes the pain as severe and colicky worsening with food.  The patient notes that she has had loose bowel movements for about a month without any hematochezia or melena.  She had some subjective chills without any fevers.  She has had some loose stools, but denies any hematochezia, melena, dysuria, hematuria.  She denies any headache, shortness of breath, coughing, hemoptysis.  She had an episode of chest pain on 09/19/2019 lasting 5 seconds and mid left chest.  She did not have any associated nausea, diaphoresis, shortness of breath.  She continues to smoke 1/2 pack/day.  She was at over 50-pack-year history. In the emergency department, the patient was afebrile hemodynamically stable with oxygen saturation 100% room air.  BMP showed a sodium of 128.  Otherwise was unremarkable.  LFTs were unremarkable.  WBC 11.4, hemoglobin 12.4, platelets 136,000.  Lipase 35.  CT of the abdomen and pelvis showed a 9.5 x 4.5 x 5.0 cm rim-enhancing fluid collection in the vicinity of the appendix close to the terminal ileum.  There was right hydronephrosis with distal third of the right ureter effaced and obstructed by overlying fluid collection.  The patient was started on Zosyn.  General surgery was consulted to assist with management.  Assessment/Plan: Intra-abdominal abscess secondary to perforated appendix -Continue Zosyn -General surgery consult -N.p.o. for now -Continue IV fluids -IV opioids for pain control  Hyponatremia -Patient has chronic hyponatremia likely secondary to Zoloft -FeNa--0.17% -Acute on chronic hyponatremia secondary to volume depletion secondary to vomiting and loose  stools  Tobacco abuse -She has over 50-pack-year history -Stable on room air I have discussed tobacco cessation with the patient.  I have counseled the patient regarding the negative impacts of continued tobacco use including but not limited to lung cancer, COPD, and cardiovascular disease.  I have discussed alternatives to tobacco and modalities that may help facilitate tobacco cessation including but not limited to biofeedback, hypnosis, and medications.  Total time spent with tobacco counseling was 4 minutes.  Essential hypertension -Holding losartan secondary to soft blood pressure  Hyperlipidemia -Patient states that she quit taking Lipitor 1 month ago -She felt there was causing her abdominal pain and diarrhea  Generalized anxiety disorder -Restart sertraline when able to tolerate p.o.  Abnormal EKG -TWI inferior leads -Echo -personally reviewed EKG--sinus, TWI--II, III, aVF new since Jan 2019     Disposition Plan: Patient From: Home D/C Place: Home- 2-3  Days Barriers: Not Clinically Stable--surgical evaluation for intraabdominal abscess requiring IV abx  Family Communication:  No Family at bedside  Consultants:  General surgery  Code Status:  FULL   DVT Prophylaxis:  [place] Heparin    Procedures: As Listed in Progress Note Above  Antibiotics: Zosyn 4/12>>>     Subjective: Patient continues to complain of abdominal pain a little better than yesterday.  She had one episode of nausea vomiting overnight.  She has subjective fevers and chills.  She denies any chest pain, shortness breath, coughing, hemoptysis.  There is no dysuria or hematuria.  Objective: Vitals:   09/20/19 2100 09/20/19 2244 09/20/19 2244 09/21/19 0305  BP: 115/73  107/77 (!) 96/54  Pulse: 65  (!) 58 (!) 54  Resp: 14  16 16   Temp:   98 F (36.7 C) 97.8 F (36.6 C)  TempSrc:   Oral Oral  SpO2: 94%  100% 100%  Weight:  54.8 kg    Height:  5\' 7"  (1.702 m)      Intake/Output Summary  (Last 24 hours) at 09/21/2019 0710 Last data filed at 09/21/2019 0300 Gross per 24 hour  Intake 493.86 ml  Output --  Net 493.86 ml   Weight change:  Exam:   General:  Pt is alert, follows commands appropriately, not in acute distress  HEENT: No icterus, No thrush, No neck mass, [place]/AT  Cardiovascular: RRR, S1/S2, no rubs, no gallops  Respiratory: bibasilar crackles. No wheeze  Abdomen: Soft/+BS, non tender, non distended, no guarding  Extremities: No edema, No lymphangitis, No petechiae, No rashes, no synovitis   Data Reviewed: I have personally reviewed following labs and imaging studies Basic Metabolic Panel: Recent Labs  Lab 09/20/19 1558 09/21/19 0421  NA 128* 134*  K 3.6 4.0  CL 94* 101  CO2 22 23  GLUCOSE 100* 89  BUN 10 10  CREATININE 0.74 0.73  CALCIUM 9.0 8.7*  MG 2.1 2.1   Liver Function Tests: Recent Labs  Lab 09/20/19 1558 09/21/19 0421  AST 24 16  ALT 29 22  ALKPHOS 83 65  BILITOT 0.7 0.7  PROT 8.4* 6.9  ALBUMIN 3.3* 2.7*   Recent Labs  Lab 09/20/19 1558  LIPASE 35   No results for input(s): AMMONIA in the last 168 hours. Coagulation Profile: Recent Labs  Lab 09/20/19 1558  INR 1.2   CBC: Recent Labs  Lab 09/20/19 1558 09/21/19 0421  WBC 11.4* 6.8  NEUTROABS 9.7* 5.0  HGB 12.4 10.5*  HCT 36.8 32.6*  MCV 90.4 93.4  PLT 436* 366   Cardiac Enzymes: No results for input(s): CKTOTAL, CKMB, CKMBINDEX, TROPONINI in the last 168 hours. BNP: Invalid input(s): POCBNP CBG: No results for input(s): GLUCAP in the last 168 hours. HbA1C: No results for input(s): HGBA1C in the last 72 hours. Urine analysis:    Component Value Date/Time   COLORURINE YELLOW 09/20/2019 2125   APPEARANCEUR CLEAR 09/20/2019 2125   LABSPEC 1.015 09/20/2019 2125   PHURINE 6.0 09/20/2019 2125   GLUCOSEU NEGATIVE 09/20/2019 2125   HGBUR SMALL (A) 09/20/2019 2125   BILIRUBINUR NEGATIVE 09/20/2019 2125   KETONESUR NEGATIVE 09/20/2019 2125   PROTEINUR  NEGATIVE 09/20/2019 2125   UROBILINOGEN 0.2 10/23/2013 2120   NITRITE NEGATIVE 09/20/2019 2125   LEUKOCYTESUR NEGATIVE 09/20/2019 2125   Sepsis Labs: @LABRCNTIP (procalcitonin:4,lacticidven:4) ) Recent Results (from the past 240 hour(s))  Respiratory Panel by RT PCR (Flu A&B, Covid) - Nasopharyngeal Swab     Status: None   Collection Time: 09/20/19  8:10 PM   Specimen: Nasopharyngeal Swab  Result Value Ref Range Status   SARS Coronavirus 2 by RT PCR NEGATIVE NEGATIVE Final    Comment: (NOTE) SARS-CoV-2 target nucleic acids are NOT DETECTED. The SARS-CoV-2 RNA is generally detectable in upper respiratoy specimens during the acute phase of infection. The lowest concentration of SARS-CoV-2 viral copies this assay can detect is 131 copies/mL. A negative result does not preclude SARS-Cov-2 infection and should not be used as the sole basis for treatment or other patient management decisions. A negative result may occur with  improper specimen collection/handling, submission of specimen other than nasopharyngeal swab, presence of viral mutation(s) within the areas targeted by this assay, and inadequate number of viral copies (<131 copies/mL). A negative result  must be combined with clinical observations, patient history, and epidemiological information. The expected result is Negative. Fact Sheet for Patients:  PinkCheek.be Fact Sheet for Healthcare Providers:  GravelBags.it This test is not yet ap proved or cleared by the [place] FDA and  has been authorized for detection and/or diagnosis of SARS-CoV-2 by FDA under an Emergency Use Authorization (EUA). This EUA will remain  in effect (meaning this test can be used) for the duration of the COVID-19 declaration under Section 564(b)(1) of the Act, 21 U.S.C. section 360bbb-3(b)(1), unless the authorization is terminated or revoked sooner.    Influenza A by PCR NEGATIVE  NEGATIVE Final   Influenza B by PCR NEGATIVE NEGATIVE Final    Comment: (NOTE) The Xpert Xpress SARS-CoV-2/FLU/RSV assay is intended as an aid in  the diagnosis of influenza from Nasopharyngeal swab specimens and  should not be used as a sole basis for treatment. Nasal washings and  aspirates are unacceptable for Xpert Xpress SARS-CoV-2/FLU/RSV  testing. Fact Sheet for Patients: PinkCheek.be Fact Sheet for Healthcare Providers: GravelBags.it This test is not yet approved or cleared by the [place] FDA and  has been authorized for detection and/or diagnosis of SARS-CoV-2 by  FDA under an Emergency Use Authorization (EUA). This EUA will remain  in effect (meaning this test can be used) for the duration of the  Covid-19 declaration under Section 564(b)(1) of the Act, 21  U.S.C. section 360bbb-3(b)(1), unless the authorization is  terminated or revoked. Performed at [facility], [address], [place], [place] [address]   Culture, blood (Routine X 2) w Reflex to ID Panel     Status: None (Preliminary result)   Collection Time: 09/20/19  9:39 PM   Specimen: Left Antecubital; Blood  Result Value Ref Range Status   Specimen Description LEFT ANTECUBITAL  Final   Special Requests   Final    BOTTLES DRAWN AEROBIC AND ANAEROBIC Blood Culture adequate volume Performed at [facility], [address] [place], [place] [address]    Culture PENDING  Incomplete   Report Status PENDING  Incomplete  Culture, blood (Routine X 2) w Reflex to ID Panel     Status: None (Preliminary result)   Collection Time: 09/20/19  9:41 PM   Specimen: BLOOD RIGHT ARM  Result Value Ref Range Status   Specimen Description BLOOD RIGHT ARM  Final   Special Requests   Final    BOTTLES DRAWN AEROBIC AND ANAEROBIC Blood Culture adequate volume Performed at [facility], [address], [place], [place] [address]    Culture PENDING  Incomplete   Report  Status PENDING  Incomplete     Scheduled Meds: . sodium chloride flush  3 mL Intravenous Q12H   Continuous Infusions: . lactated ringers 125 mL/hr at 09/20/19 2326  . piperacillin-tazobactam (ZOSYN)  IV 3.375 g (09/21/19 0536)    Procedures/Studies: CT ABDOMEN PELVIS W CONTRAST  Result Date: 09/20/2019 CLINICAL DATA:  Abdominal pain for 2 weeks, neutropenia EXAM: CT ABDOMEN AND PELVIS WITH CONTRAST TECHNIQUE: Multidetector CT imaging of the abdomen and pelvis was performed using the standard protocol following bolus administration of intravenous contrast. CONTRAST:  129mL OMNIPAQUE IOHEXOL 300 MG/ML  SOLN COMPARISON:  None. FINDINGS: Lower chest: No acute abnormality.  Coronary artery disease. Hepatobiliary: No solid liver abnormality is seen. Rim calcified gallstone in the gallbladder. No gallbladder wall thickening, or biliary dilatation. Pancreas: Unremarkable. No pancreatic ductal dilatation or surrounding inflammatory changes. Spleen: Normal in size without significant abnormality. Adrenals/Urinary Tract: Adrenal glands  are unremarkable. Moderate right hydronephrosis and hydroureter, the distal third of the right ureter appears effaced and obstructed by overlying fluid collection. The left kidney is normal. Bladder is unremarkable. Stomach/Bowel: Stomach is within normal limits. There is a large, rim enhancing fluid collection in the expected vicinity of the appendix measuring 9.5 x 4.5 x 5.0 cm (series 2, image 62). There appears to be a dependent calcified appendicolith. This collection is very closely apposed to the terminal loop of ileum, which is thickened appearing. No evidence of bowel wall thickening, distention, or inflammatory changes. Vascular/Lymphatic: Aortic atherosclerosis. No enlarged abdominal or pelvic lymph nodes. Reproductive: No mass or other significant abnormality. Other: No abdominal wall hernia or abnormality. Trace fluid in the right lower quadrant. Musculoskeletal: No  acute or significant osseous findings. IMPRESSION: 1. There is a large, rim enhancing fluid collection in the expected vicinity of the appendix measuring 9.5 x 4.5 x 5.0 cm, most consistent with an unusually large appendiceal abscess. 2. The terminal loop of ileum adjacent to this collection is thickened appearing and very closely apposed, likely reactive to adjacent infection. 3. Moderate right hydronephrosis and hydroureter, the distal third of the right ureter appears effaced and obstructed by overlying fluid collection. 4.  Cholelithiasis. 5.  Coronary artery disease.  Aortic Atherosclerosis (ICD10-I70.0). Electronically Signed   By: Eddie Candle M.D.   On: 09/20/2019 17:31    Orson Eva, DO  [facility]  If 7PM-7AM, please contact night-coverage [email or web address] Password [email or web address] 09/21/2019, 7:10 AM   LOS: 1 day

## 2019-09-21 NOTE — Progress Notes (Signed)
IR requested by Dr. Carles Collet for possible image-guided aspiration/drainage of appendiceal abscess.  Images have been reviewed by Dr. Anselm Pancoast who approves procedure. Plan for image-guided aspiration/drainage of appendiceal abscess tentatively for tomorrow in IR. Patient will be transferred from [facility] to [facility] for procedure, and back to [facility] following procedure. Patient to arrive at [facility] no later than 1200 for 1300 procedure. Dianna, RN from [facility] aware to facilitate transport. Patient to be NPO at midnight. Patient currently not on any blood thinners- please ensure that patient does not receive any prior to procedure. Patient will be seen/consented for procedure tomorrow either at [facility] prior to transport or at [facility] prior to procedure.  Please call IR with questions/concerns.   Bea Graff Sharbel Sahagun, PA-C 09/21/2019, 3:26 PM

## 2019-09-22 ENCOUNTER — Ambulatory Visit (HOSPITAL_COMMUNITY): Payer: BC Managed Care – PPO | Attending: Internal Medicine

## 2019-09-22 DIAGNOSIS — R112 Nausea with vomiting, unspecified: Secondary | ICD-10-CM

## 2019-09-22 DIAGNOSIS — Z720 Tobacco use: Secondary | ICD-10-CM

## 2019-09-22 DIAGNOSIS — F411 Generalized anxiety disorder: Secondary | ICD-10-CM | POA: Diagnosis not present

## 2019-09-22 DIAGNOSIS — R1031 Right lower quadrant pain: Secondary | ICD-10-CM | POA: Diagnosis not present

## 2019-09-22 DIAGNOSIS — K3532 Acute appendicitis with perforation and localized peritonitis, without abscess: Secondary | ICD-10-CM | POA: Diagnosis not present

## 2019-09-22 DIAGNOSIS — K3533 Acute appendicitis with perforation and localized peritonitis, with abscess: Secondary | ICD-10-CM | POA: Diagnosis not present

## 2019-09-22 DIAGNOSIS — I10 Essential (primary) hypertension: Secondary | ICD-10-CM | POA: Diagnosis not present

## 2019-09-22 DIAGNOSIS — E782 Mixed hyperlipidemia: Secondary | ICD-10-CM | POA: Diagnosis not present

## 2019-09-22 DIAGNOSIS — E871 Hypo-osmolality and hyponatremia: Secondary | ICD-10-CM

## 2019-09-22 LAB — CBC
HCT: 32.1 % — ABNORMAL LOW (ref 36.0–46.0)
Hemoglobin: 10.1 g/dL — ABNORMAL LOW (ref 12.0–15.0)
MCH: 29.9 pg (ref 26.0–34.0)
MCHC: 31.5 g/dL (ref 30.0–36.0)
MCV: 95 fL (ref 80.0–100.0)
Platelets: 382 10*3/uL (ref 150–400)
RBC: 3.38 MIL/uL — ABNORMAL LOW (ref 3.87–5.11)
RDW: 13.1 % (ref 11.5–15.5)
WBC: 4.5 10*3/uL (ref 4.0–10.5)
nRBC: 0 % (ref 0.0–0.2)

## 2019-09-22 LAB — BASIC METABOLIC PANEL
Anion gap: 6 (ref 5–15)
BUN: 7 mg/dL — ABNORMAL LOW (ref 8–23)
CO2: 26 mmol/L (ref 22–32)
Calcium: 8.4 mg/dL — ABNORMAL LOW (ref 8.9–10.3)
Chloride: 104 mmol/L (ref 98–111)
Creatinine, Ser: 0.8 mg/dL (ref 0.44–1.00)
GFR calc Af Amer: >60 mL/min (ref >60)
GFR calc non Af Amer: >60 mL/min (ref >60)
Glucose, Bld: 77 mg/dL (ref 70–99)
Potassium: 4 mmol/L (ref 3.5–5.1)
Sodium: 136 mmol/L (ref 135–145)

## 2019-09-22 LAB — MAGNESIUM: Magnesium: 2 mg/dL (ref 1.7–2.4)

## 2019-09-22 MED ORDER — MIDAZOLAM HCL 2 MG/2ML IJ SOLN
INTRAMUSCULAR | Status: AC
  Filled 2019-09-22: qty 2

## 2019-09-22 MED ORDER — FENTANYL CITRATE (PF) 100 MCG/2ML IJ SOLN
INTRAMUSCULAR | Status: AC
  Filled 2019-09-22: qty 2

## 2019-09-22 MED ORDER — SODIUM CHLORIDE 0.9% FLUSH
5.0000 mL | Freq: Three times a day (TID) | INTRAVENOUS | Status: DC
  Administered 2019-09-22 – 2019-09-24 (×5): 5 mL

## 2019-09-22 MED ORDER — MORPHINE SULFATE (PF) 2 MG/ML IV SOLN
INTRAVENOUS | Status: AC
  Administered 2019-09-22: 2 mg via INTRAVENOUS
  Filled 2019-09-22: qty 1

## 2019-09-22 MED ORDER — FENTANYL CITRATE (PF) 100 MCG/2ML IJ SOLN
INTRAMUSCULAR | Status: AC | PRN
  Administered 2019-09-22: 25 ug via INTRAVENOUS
  Administered 2019-09-22: 50 ug via INTRAVENOUS
  Administered 2019-09-22 (×3): 25 ug via INTRAVENOUS

## 2019-09-22 MED ORDER — MIDAZOLAM HCL 2 MG/2ML IJ SOLN
INTRAMUSCULAR | Status: AC | PRN
  Administered 2019-09-22 (×2): 0.5 mg via INTRAVENOUS
  Administered 2019-09-22: 1 mg via INTRAVENOUS
  Administered 2019-09-22: 0.5 mg via INTRAVENOUS

## 2019-09-22 NOTE — Progress Notes (Signed)
PROGRESS NOTE  Danielle Morris [id number] DOB: [birth year] DOA: 09/20/2019 PCP: Susy Frizzle, MD  Brief History:  64 year old female with a history of hypertension, hyperlipidemia, tobacco abuse, GERD presenting with 2-week history of generalized abdominal pain, worsening in the right lower quadrant.  She describes the pain as severe and colicky worsening with food.  The patient notes that she has had loose bowel movements for about a month without any hematochezia or melena.  She had some subjective chills without any fevers.  She has had some loose stools, but denies any hematochezia, melena, dysuria, hematuria.  She denies any headache, shortness of breath, coughing, hemoptysis.  She had an episode of chest pain on 09/19/2019 lasting 5 seconds and mid left chest.  She did not have any associated nausea, diaphoresis, shortness of breath.  She continues to smoke 1/2 pack/day.  She was at over 50-pack-year history. In the emergency department, the patient was afebrile hemodynamically stable with oxygen saturation 100% room air.  BMP showed a sodium of 128.  Otherwise was unremarkable.  LFTs were unremarkable.  WBC 11.4, hemoglobin 12.4, platelets 136,000.  Lipase 35.  CT of the abdomen and pelvis showed a 9.5 x 4.5 x 5.0 cm rim-enhancing fluid collection in the vicinity of the appendix close to the terminal ileum.  There was right hydronephrosis with distal third of the right ureter effaced and obstructed by overlying fluid collection.  The patient was started on Zosyn.  General surgery was consulted to assist with management.  Assessment/Plan: Intra-abdominal abscess secondary to perforated appendix -Continue Zosyn -General surgery consultation appreciated; will follow further recommendations. -Advance diet to full liquid after drain placement. -Continue IV fluids -Continue IV opioids for pain control  Hyponatremia -Patient has chronic hyponatremia likely secondary to  Zoloft -FeNa--0.17% -Acute on chronic hyponatremia secondary to volume depletion secondary to vomiting and loose stools. -Continue supplementation and follow electrolytes trend.  Tobacco abuse -She has over 50-pack-year history -Stable on room air -Cessation counseling provided.  Essential hypertension -Continue holding losartan secondary to soft blood pressure  Hyperlipidemia -Patient states that she quit taking Lipitor 1 month ago -She felt there was causing her abdominal pain and diarrhea; recommend resumption at time of discharge.  Generalized anxiety disorder -Restart sertraline when able to tolerate p.o.  Abnormal EKG -TWI inferior leads -Echo -personally reviewed EKG--sinus, TWI--II, III, aVF new since Jan 2019   Disposition Plan: Patient From: Home D/C Place: Home- 2-3  Days Barriers: Not Clinically Stable--surgical evaluation for intraabdominal abscess requiring IV abx; planning for interventional radiology's percutaneous drain placement later today.  Continue antibiotics.  Follow follow general surgery recommendations.  Family Communication:  No Family at bedside  Consultants:  General surgery, IR.  Code Status:  FULL   DVT Prophylaxis:  [place] Heparin    Procedures: As Listed in Progress Note Above  Antibiotics: Zosyn 4/12>>>   Subjective: Still complaining of abdominal pain/pressure (right lower quadrant); no nausea, no vomiting, no shortness of breath.  Patient is afebrile.  Objective: Vitals:   09/22/19 1355 09/22/19 1400 09/22/19 1415 09/22/19 1430  BP: 129/65 122/66 120/66 123/74  Pulse: 68 70 68 80  Resp: 12 16    Temp:      TempSrc:      SpO2: 100% 100% 100% 100%  Weight:      Height:        Intake/Output Summary (Last 24 hours) at 09/22/2019 1448 Last data filed at 09/21/2019 1700 Gross  per 24 hour  Intake 240 ml  Output --  Net 240 ml   Weight change: 2.768 kg  Exam: General exam: Alert, awake, oriented x 3; in no acute distress  and able to follow commands appropriately.  Reports no nausea or vomiting; continue complaining of abdominal discomfort and right lower quadrant pressure.  Afebrile Respiratory system: Clear to auscultation. Respiratory effort normal. Cardiovascular system:RRR. No murmurs, rubs, gallops. Gastrointestinal system: Abdomen is nondistended, soft and mildly tender to deep palpation in her right lower quadrant; no guarding, positive bowel sounds.  Central nervous system: Alert and oriented. No focal neurological deficits. Extremities: No C/C/E, +pedal pulses Skin: No rashes, lesions or ulcers Psychiatry: Judgement and insight appear normal. Mood & affect appropriate.    Data Reviewed: I have personally reviewed following labs and imaging studies Basic Metabolic Panel: Recent Labs  Lab 09/20/19 1558 09/21/19 0421 09/22/19 0417  NA 128* 134* 136  K 3.6 4.0 4.0  CL 94* 101 104  CO2 22 23 26   GLUCOSE 100* 89 77  BUN 10 10 7*  CREATININE 0.74 0.73 0.80  CALCIUM 9.0 8.7* 8.4*  MG 2.1 2.1 2.0   Liver Function Tests: Recent Labs  Lab 09/20/19 1558 09/21/19 0421  AST 24 16  ALT 29 22  ALKPHOS 83 65  BILITOT 0.7 0.7  PROT 8.4* 6.9  ALBUMIN 3.3* 2.7*   Recent Labs  Lab 09/20/19 1558  LIPASE 35   Coagulation Profile: Recent Labs  Lab 09/20/19 1558  INR 1.2   CBC: Recent Labs  Lab 09/20/19 1558 09/21/19 0421 09/22/19 0417  WBC 11.4* 6.8 4.5  NEUTROABS 9.7* 5.0  --   HGB 12.4 10.5* 10.1*  HCT 36.8 32.6* 32.1*  MCV 90.4 93.4 95.0  PLT 436* 366 382   Urine analysis:    Component Value Date/Time   COLORURINE YELLOW 09/20/2019 2125   APPEARANCEUR CLEAR 09/20/2019 2125   LABSPEC 1.015 09/20/2019 2125   PHURINE 6.0 09/20/2019 2125   GLUCOSEU NEGATIVE 09/20/2019 2125   HGBUR SMALL (A) 09/20/2019 2125   BILIRUBINUR NEGATIVE 09/20/2019 2125   [place] NEGATIVE 09/20/2019 2125   PROTEINUR NEGATIVE 09/20/2019 2125   UROBILINOGEN 0.2 10/23/2013 2120   NITRITE NEGATIVE  09/20/2019 2125   LEUKOCYTESUR NEGATIVE 09/20/2019 2125    Recent Results (from the past 240 hour(s))  Respiratory Panel by RT PCR (Flu A&B, Covid) - Nasopharyngeal Swab     Status: None   Collection Time: 09/20/19  8:10 PM   Specimen: Nasopharyngeal Swab  Result Value Ref Range Status   SARS Coronavirus 2 by RT PCR NEGATIVE NEGATIVE Final    Comment: (NOTE) SARS-CoV-2 target nucleic acids are NOT DETECTED. The SARS-CoV-2 RNA is generally detectable in upper respiratoy specimens during the acute phase of infection. The lowest concentration of SARS-CoV-2 viral copies this assay can detect is 131 copies/mL. A negative result does not preclude SARS-Cov-2 infection and should not be used as the sole basis for treatment or other patient management decisions. A negative result may occur with  improper specimen collection/handling, submission of specimen other than nasopharyngeal swab, presence of viral mutation(s) within the areas targeted by this assay, and inadequate number of viral copies (<131 copies/mL). A negative result must be combined with clinical observations, patient history, and epidemiological information. The expected result is Negative. Fact Sheet for Patients:  PinkCheek.be Fact Sheet for Healthcare Providers:  GravelBags.it This test is not yet ap proved or cleared by the [place] and  has been authorized for  detection and/or diagnosis of SARS-CoV-2 by FDA under an Emergency Use Authorization (EUA). This EUA will remain  in effect (meaning this test can be used) for the duration of the COVID-19 declaration under Section 564(b)(1) of the Act, 21 U.S.C. section 360bbb-3(b)(1), unless the authorization is terminated or revoked sooner.    Influenza A by PCR NEGATIVE NEGATIVE Final   Influenza B by PCR NEGATIVE NEGATIVE Final    Comment: (NOTE) The Xpert Xpress SARS-CoV-2/FLU/RSV assay is intended as an  aid in  the diagnosis of influenza from Nasopharyngeal swab specimens and  should not be used as a sole basis for treatment. Nasal washings and  aspirates are unacceptable for Xpert Xpress SARS-CoV-2/FLU/RSV  testing. Fact Sheet for Patients: PinkCheek.be Fact Sheet for Healthcare Providers: GravelBags.it This test is not yet approved or cleared by the [place] FDA and  has been authorized for detection and/or diagnosis of SARS-CoV-2 by  FDA under an Emergency Use Authorization (EUA). This EUA will remain  in effect (meaning this test can be used) for the duration of the  Covid-19 declaration under Section 564(b)(1) of the Act, 21  U.S.C. section 360bbb-3(b)(1), unless the authorization is  terminated or revoked. Performed at [facility], [address] [place], [place] [address]   Culture, blood (Routine X 2) w Reflex to ID Panel     Status: None (Preliminary result)   Collection Time: 09/20/19  9:39 PM   Specimen: Left Antecubital; Blood  Result Value Ref Range Status   Specimen Description LEFT ANTECUBITAL  Final   Special Requests   Final    BOTTLES DRAWN AEROBIC AND ANAEROBIC Blood Culture adequate volume   Culture   Final    NO GROWTH 2 DAYS Performed at [facility], [address] [place], [place] [address]    Report Status PENDING  Incomplete  Culture, blood (Routine X 2) w Reflex to ID Panel     Status: None (Preliminary result)   Collection Time: 09/20/19  9:41 PM   Specimen: BLOOD RIGHT ARM  Result Value Ref Range Status   Specimen Description BLOOD RIGHT ARM  Final   Special Requests   Final    BOTTLES DRAWN AEROBIC AND ANAEROBIC Blood Culture adequate volume   Culture   Final    NO GROWTH 2 DAYS Performed at [facility], [address] [place],  [address]    Report Status PENDING  Incomplete     Scheduled Meds: . morphine      . feeding supplement  1 Container Oral TID BM  . fentaNYL       . fentaNYL      . midazolam      . midazolam      . sodium chloride flush  3 mL Intravenous Q12H   Continuous Infusions: . lactated ringers 125 mL/hr at 09/22/19 0958  . piperacillin-tazobactam (ZOSYN)  IV 3.375 g (09/22/19 0603)    Procedures/Studies: CT ABDOMEN PELVIS W CONTRAST  Result Date: 09/20/2019 CLINICAL DATA:  Abdominal pain for 2 weeks, neutropenia EXAM: CT ABDOMEN AND PELVIS WITH CONTRAST TECHNIQUE: Multidetector CT imaging of the abdomen and pelvis was performed using the standard protocol following bolus administration of intravenous contrast. CONTRAST:  162mL OMNIPAQUE IOHEXOL 300 MG/ML  SOLN COMPARISON:  None. FINDINGS: Lower chest: No acute abnormality.  Coronary artery disease. Hepatobiliary: No solid liver abnormality is seen. Rim calcified gallstone in the gallbladder. No gallbladder wall thickening, or biliary dilatation. Pancreas: Unremarkable. No pancreatic ductal dilatation or surrounding inflammatory changes. Spleen:  Normal in size without significant abnormality. Adrenals/Urinary Tract: Adrenal glands are unremarkable. Moderate right hydronephrosis and hydroureter, the distal third of the right ureter appears effaced and obstructed by overlying fluid collection. The left kidney is normal. Bladder is unremarkable. Stomach/Bowel: Stomach is within normal limits. There is a large, rim enhancing fluid collection in the expected vicinity of the appendix measuring 9.5 x 4.5 x 5.0 cm (series 2, image 62). There appears to be a dependent calcified appendicolith. This collection is very closely apposed to the terminal loop of ileum, which is thickened appearing. No evidence of bowel wall thickening, distention, or inflammatory changes. Vascular/Lymphatic: Aortic atherosclerosis. No enlarged abdominal or pelvic lymph nodes. Reproductive: No mass or other significant abnormality. Other: No abdominal wall hernia or abnormality. Trace fluid in the right lower quadrant. Musculoskeletal:  No acute or significant osseous findings. IMPRESSION: 1. There is a large, rim enhancing fluid collection in the expected vicinity of the appendix measuring 9.5 x 4.5 x 5.0 cm, most consistent with an unusually large appendiceal abscess. 2. The terminal loop of ileum adjacent to this collection is thickened appearing and very closely apposed, likely reactive to adjacent infection. 3. Moderate right hydronephrosis and hydroureter, the distal third of the right ureter appears effaced and obstructed by overlying fluid collection. 4.  Cholelithiasis. 5.  Coronary artery disease.  Aortic Atherosclerosis (ICD10-I70.0). Electronically Signed   By: Eddie Candle M.D.   On: 09/20/2019 17:31   ECHOCARDIOGRAM COMPLETE  Result Date: 09/21/2019    ECHOCARDIOGRAM REPORT   Patient Name:   NOVICE VILLARROEL Date of Exam: 09/21/2019 Medical Rec #:  [id number]    Height:       67.0 in Accession #:    [id number]   Weight:       120.8 lb Date of Birth:  04-16-1956   BSA:          72.632 m Patient Age:    8 years     BP:           100/69 mmHg Patient Gender: F            HR:           59 bpm. Exam Location:  [facility] Procedure: 2D Echo Indications:    Abnormal ECG 794.31 / R94.31  History:        Patient has no prior history of Echocardiogram examinations.                 Risk Factors:Current Smoker, Hypertension and Dyslipidemia.                 Perforated appendix.  Sonographer:    Leavy Cella RDCS (AE) Referring Phys: [phone] DAVID TAT IMPRESSIONS  1. Left ventricular ejection fraction, by estimation, is 70 to 75%. The left ventricle has hyperdynamic function. The left ventricle has no regional wall motion abnormalities. Left ventricular diastolic parameters were normal.  2. Right ventricular systolic function is normal. The right ventricular size is normal. There is normal pulmonary artery systolic pressure. The estimated right ventricular systolic pressure is [id number] mmHg.  3. The mitral valve is grossly normal. Mild mitral valve  regurgitation.  4. The aortic valve has an indeterminant number of cusps and is moderately sclerotic without stenosis. Aortic valve regurgitation is not visualized.  5. The inferior vena cava is normal in size with greater than 50% respiratory variability, suggesting right atrial pressure of 3 mmHg. FINDINGS  Left Ventricle: Left ventricular ejection fraction, by estimation, is 70 to  75%. The left ventricle has hyperdynamic function. The left ventricle has no regional wall motion abnormalities. The left ventricular internal cavity size was normal in size. There is no left ventricular hypertrophy. Left ventricular diastolic parameters were normal. Right Ventricle: The right ventricular size is normal. No increase in right ventricular wall thickness. Right ventricular systolic function is normal. There is normal pulmonary artery systolic pressure. The tricuspid regurgitant velocity is 2.41 m/s, and  with an assumed right atrial pressure of 3 mmHg, the estimated right ventricular systolic pressure is [id number] mmHg. Left Atrium: Left atrial size was normal in size. Right Atrium: Right atrial size was normal in size. Pericardium: There is no evidence of pericardial effusion. Presence of pericardial fat pad. Mitral Valve: The mitral valve is grossly normal. Mild mitral valve regurgitation. Tricuspid Valve: The tricuspid valve is grossly normal. Tricuspid valve regurgitation is trivial. Aortic Valve: The aortic valve has an indeterminant number of cusps. Aortic valve regurgitation is not visualized. Mild aortic valve annular calcification. There is moderate calcification of the aortic valve. Pulmonic Valve: The pulmonic valve was not well visualized. Pulmonic valve regurgitation is not visualized. Aorta: The aortic root is normal in size and structure. Venous: The inferior vena cava is normal in size with greater than 50% respiratory variability, suggesting right atrial pressure of 3 mmHg. IAS/Shunts: No atrial level shunt  detected by color flow Doppler.  LEFT VENTRICLE PLAX 2D LVIDd:         4.30 cm  Diastology LVIDs:         2.48 cm  LV e' lateral:   9.14 cm/s LV PW:         0.98 cm  LV E/e' lateral: 8.8 LV IVS:        0.83 cm  LV e' medial:    7.62 cm/s LVOT diam:     1.90 cm  LV E/e' medial:  10.6 LVOT Area:     2.84 cm  RIGHT VENTRICLE RV S prime:     12.30 cm/s TAPSE (M-mode): 2.4 cm LEFT ATRIUM             Index LA diam:        3.70 cm 2.27 cm/m LA Vol (A2C):   40.8 ml 25.00 ml/m LA Vol (A4C):   33.8 ml 20.71 ml/m LA Biplane Vol: 39.1 ml 23.95 ml/m   AORTA Ao Root diam: 2.60 cm MITRAL VALVE               TRICUSPID VALVE MV Area (PHT): 3.08 cm    TR Peak grad:   23.2 mmHg MV Decel Time: 246 msec    TR Vmax:        241.00 cm/s MV E velocity: 80.60 cm/s MV A velocity: 59.60 cm/s  SHUNTS MV E/A ratio:  1.35        Systemic Diam: 1.90 cm Rozann Lesches MD Electronically signed by Rozann Lesches MD Signature Date/Time: 09/21/2019/12:11:58 PM    Final     Barton Dubois, MD  [facility]  09/22/2019, 2:48 PM   LOS: 2 days

## 2019-09-22 NOTE — Progress Notes (Signed)
[facility]  Patient going for IR today. Using restroom currently and unable to examine patient. She is tearful due to needing the procedure and anxiety related to this procedure.  BP 115/79 (BP Location: Left Arm)   Pulse 60   Temp 97.6 F (36.4 C) (Oral)   Resp 16   Ht 5\' 7"  (1.702 m)   Wt 57.2 kg   SpO2 99%   BMI 19.75 kg/m  Distressed Using restroom   Acute perforated appendicitis. IR today for drainage. Hopefully this will help with the pain/ pressure.  Can have diet after IR. IV antibiotics continue.  Curlene Labrum, MD [facility] [address] [place], [place] [address] [phone] (office)

## 2019-09-22 NOTE — Consult Note (Signed)
Chief Complaint: Patient was seen in consultation today for abdominal abscess drain placement Chief Complaint  Patient presents with  . Abdominal Pain   at the request of Dr Hayes Ludwig   Supervising Physician: Arne Cleveland  Patient Status: [facility] IP  History of Present Illness: Danielle Morris is a 64 y.o. female   HTN; HLD Admitted at [facility] with ruptured appendix 09/20/19 2 weeks abd pain-- worsening; N/V  CT yesterday:  IMPRESSION: 1. There is a large, rim enhancing fluid collection in the expected vicinity of the appendix measuring 9.5 x 4.5 x 5.0 cm, most consistent with an unusually large appendiceal abscess. 2. The terminal loop of ileum adjacent to this collection is thickened appearing and very closely apposed, likely reactive to adjacent infection. 3. Moderate right hydronephrosis and hydroureter, the distal third of the right ureter appears effaced and obstructed by overlying fluid collection. 4.  Cholelithiasis. 5.  Coronary artery disease.  Aortic Atherosclerosis (ICD10-I70.0).  Dr Hayes Ludwig requesting abd abscess drain placement Dr Marzetta Merino reviewed imaging and approves procedure  Scheduled now for drain placement at [facility] today  Past Medical History:  Diagnosis Date  . GAD (generalized anxiety disorder)   . HLD (hyperlipidemia)   . Hypertension   . Panic attack   . Tobacco abuse     Past Surgical History:  Procedure Laterality Date  . COLONOSCOPY WITH PROPOFOL N/A 11/02/2013   Procedure: COLONOSCOPY WITH PROPOFOL;  Surgeon: Danie Binder, MD;  Location: [facility];  Service: Endoscopy;  Laterality: N/A;  in cecum @ 1002; cecal withdrawal time- minutes  . HEMORRHOID BANDING N/A 11/02/2013   Procedure: HEMORRHOID BANDING;  Surgeon: Danie Binder, MD;  Location: [facility];  Service: Endoscopy;  Laterality: N/A;  . HEMORRHOID SURGERY N/A 11/05/2013   Procedure: EXTENSIVE HEMORRHOIDECTOMY ;  Surgeon: Jamesetta So, MD;  Location: [facility];  Service: General;   Laterality: N/A;  . POLYPECTOMY N/A 11/02/2013   Procedure: POLYPECTOMY;  Surgeon: Danie Binder, MD;  Location: [facility];  Service: Endoscopy;  Laterality: N/A;  sigmoid colon, hepatic flexure, hyperplastic rectal, and rectal polyps  . TONSILLECTOMY     age  72    Allergies: Amlodipine, Tramadol, Hydrocodone, Lisinopril, and Chantix [varenicline]  Medications: Prior to Admission medications   Medication Sig Start Date End Date Taking? Authorizing Provider  atorvastatin (LIPITOR) 40 MG tablet Take 1 tablet (40 mg total) by mouth daily. 07/28/19  Yes Susy Frizzle, MD  docusate sodium (STOOL SOFTENER) 100 MG capsule Take 100 mg by mouth daily.   Yes [provider]  doxylamine, Sleep, (UNISOM) 25 MG tablet Take 25 mg by mouth at bedtime as needed for sleep.   Yes [provider]  losartan (COZAAR) 100 MG tablet TAKE 1 TABLET BY MOUTH DAILY Patient taking differently: Take 100 mg by mouth daily.  03/29/19  Yes [place], Modena Nunnery, MD  sertraline (ZOLOFT) 50 MG tablet TAKE 1 TABLET(50 MG) BY MOUTH DAILY Patient taking differently: Take 50 mg by mouth daily.  06/21/19  Yes Susy Frizzle, MD     Family History  Problem Relation Age of Onset  . Heart disease Father 29       MI--H/O Rheumatic Fever as a child  . Hypertension Brother   . Hypertension Brother   . Diabetes Other     Social History   Socioeconomic History  . Marital status: Married    Spouse name: Not on file  . Number of children: Not  on file  . Years of education: Not on file  . Highest education level: Not on file  Occupational History  . Not on file  Tobacco Use  . Smoking status: Current Some Day Smoker    Packs/day: 0.50    Years: 10.00    Pack years: 5.00    Types: Cigarettes  . Smokeless tobacco: Never Used  . Tobacco comment: uses E-cigs  Substance and Sexual Activity  . Alcohol use: No  . Drug use: No  . Sexual activity: Not on file  Other Topics Concern  . Not on file  Social  History Narrative   Entered 03/2014:   Married.    2 Daughters and son in law, and 1 grandchild live with them now.    Keeps another grandchild frequently also.   Does not work outside of house.   Social Determinants of Health   Financial Resource Strain:   . Difficulty of Paying Living Expenses:   Food Insecurity:   . Worried About Charity fundraiser in the Last Year:   . Arboriculturist in the Last Year:   Transportation Needs:   . Film/video editor (Medical):   Marland Kitchen Lack of Transportation (Non-Medical):   Physical Activity:   . Days of Exercise per Week:   . Minutes of Exercise per Session:   Stress:   . Feeling of Stress :   Social Connections:   . Frequency of Communication with Friends and Family:   . Frequency of Social Gatherings with Friends and Family:   . Attends Religious Services:   . Active Member of Clubs or Organizations:   . Attends Archivist Meetings:   Marland Kitchen Marital Status:      Review of Systems: A 12 point ROS discussed and pertinent positives are indicated in the HPI above.  All other systems are negative.  Review of Systems  Constitutional: Positive for activity change, appetite change and fever.  Respiratory: Negative for cough and shortness of breath.   Cardiovascular: Negative for chest pain.  Gastrointestinal: Positive for abdominal pain, diarrhea, nausea and vomiting.  Psychiatric/Behavioral: Negative for behavioral problems and confusion.    Vital Signs: BP 115/79 (BP Location: Left Arm)   Pulse 60   Temp 97.6 F (36.4 C) (Oral)   Resp 16   Ht 5\' 7"  (1.702 m)   Wt 126 lb 1.7 oz (57.2 kg)   SpO2 99%   BMI 19.75 kg/m   Physical Exam Vitals reviewed.  Cardiovascular:     Rate and Rhythm: Normal rate and regular rhythm.  Pulmonary:     Breath sounds: Normal breath sounds.  Abdominal:     General: Bowel sounds are decreased.     Tenderness: There is abdominal tenderness.  Skin:    General: Skin is warm and dry.    Neurological:     Mental Status: She is oriented to person, place, and time.  Psychiatric:        Behavior: Behavior normal.     Imaging: CT ABDOMEN PELVIS W CONTRAST  Result Date: 09/20/2019 CLINICAL DATA:  Abdominal pain for 2 weeks, neutropenia EXAM: CT ABDOMEN AND PELVIS WITH CONTRAST TECHNIQUE: Multidetector CT imaging of the abdomen and pelvis was performed using the standard protocol following bolus administration of intravenous contrast. CONTRAST:  1103mL OMNIPAQUE IOHEXOL 300 MG/ML  SOLN COMPARISON:  None. FINDINGS: Lower chest: No acute abnormality.  Coronary artery disease. Hepatobiliary: No solid liver abnormality is seen. Rim calcified gallstone in the gallbladder. No  gallbladder wall thickening, or biliary dilatation. Pancreas: Unremarkable. No pancreatic ductal dilatation or surrounding inflammatory changes. Spleen: Normal in size without significant abnormality. Adrenals/Urinary Tract: Adrenal glands are unremarkable. Moderate right hydronephrosis and hydroureter, the distal third of the right ureter appears effaced and obstructed by overlying fluid collection. The left kidney is normal. Bladder is unremarkable. Stomach/Bowel: Stomach is within normal limits. There is a large, rim enhancing fluid collection in the expected vicinity of the appendix measuring 9.5 x 4.5 x 5.0 cm (series 2, image 62). There appears to be a dependent calcified appendicolith. This collection is very closely apposed to the terminal loop of ileum, which is thickened appearing. No evidence of bowel wall thickening, distention, or inflammatory changes. Vascular/Lymphatic: Aortic atherosclerosis. No enlarged abdominal or pelvic lymph nodes. Reproductive: No mass or other significant abnormality. Other: No abdominal wall hernia or abnormality. Trace fluid in the right lower quadrant. Musculoskeletal: No acute or significant osseous findings. IMPRESSION: 1. There is a large, rim enhancing fluid collection in the  expected vicinity of the appendix measuring 9.5 x 4.5 x 5.0 cm, most consistent with an unusually large appendiceal abscess. 2. The terminal loop of ileum adjacent to this collection is thickened appearing and very closely apposed, likely reactive to adjacent infection. 3. Moderate right hydronephrosis and hydroureter, the distal third of the right ureter appears effaced and obstructed by overlying fluid collection. 4.  Cholelithiasis. 5.  Coronary artery disease.  Aortic Atherosclerosis (ICD10-I70.0). Electronically Signed   By: Eddie Candle M.D.   On: 09/20/2019 17:31   ECHOCARDIOGRAM COMPLETE  Result Date: 09/21/2019    ECHOCARDIOGRAM REPORT   Patient Name:   Danielle Morris Date of Exam: 09/21/2019 Medical Rec #:  [id number]    Height:       67.0 in Accession #:    [id number]   Weight:       120.8 lb Date of Birth:  [birth year]   BSA:          87.632 m Patient Age:    45 years     BP:           100/69 mmHg Patient Gender: F            HR:           59 bpm. Exam Location:  [facility] Procedure: 2D Echo Indications:    Abnormal ECG 794.31 / R94.31  History:        Patient has no prior history of Echocardiogram examinations.                 Risk Factors:Current Smoker, Hypertension and Dyslipidemia.                 Perforated appendix.  Sonographer:    Leavy Cella RDCS (AE) Referring Phys: [id number] DAVID TAT IMPRESSIONS  1. Left ventricular ejection fraction, by estimation, is 70 to 75%. The left ventricle has hyperdynamic function. The left ventricle has no regional wall motion abnormalities. Left ventricular diastolic parameters were normal.  2. Right ventricular systolic function is normal. The right ventricular size is normal. There is normal pulmonary artery systolic pressure. The estimated right ventricular systolic pressure is [id number] mmHg.  3. The mitral valve is grossly normal. Mild mitral valve regurgitation.  4. The aortic valve has an indeterminant number of cusps and is moderately sclerotic without  stenosis. Aortic valve regurgitation is not visualized.  5. The inferior vena cava is normal in size with greater than 50% respiratory variability, suggesting right atrial  pressure of 3 mmHg. FINDINGS  Left Ventricle: Left ventricular ejection fraction, by estimation, is 70 to 75%. The left ventricle has hyperdynamic function. The left ventricle has no regional wall motion abnormalities. The left ventricular internal cavity size was normal in size. There is no left ventricular hypertrophy. Left ventricular diastolic parameters were normal. Right Ventricle: The right ventricular size is normal. No increase in right ventricular wall thickness. Right ventricular systolic function is normal. There is normal pulmonary artery systolic pressure. The tricuspid regurgitant velocity is 2.41 m/s, and  with an assumed right atrial pressure of 3 mmHg, the estimated right ventricular systolic pressure is [id number] mmHg. Left Atrium: Left atrial size was normal in size. Right Atrium: Right atrial size was normal in size. Pericardium: There is no evidence of pericardial effusion. Presence of pericardial fat pad. Mitral Valve: The mitral valve is grossly normal. Mild mitral valve regurgitation. Tricuspid Valve: The tricuspid valve is grossly normal. Tricuspid valve regurgitation is trivial. Aortic Valve: The aortic valve has an indeterminant number of cusps. Aortic valve regurgitation is not visualized. Mild aortic valve annular calcification. There is moderate calcification of the aortic valve. Pulmonic Valve: The pulmonic valve was not well visualized. Pulmonic valve regurgitation is not visualized. Aorta: The aortic root is normal in size and structure. Venous: The inferior vena cava is normal in size with greater than 50% respiratory variability, suggesting right atrial pressure of 3 mmHg. IAS/Shunts: No atrial level shunt detected by color flow Doppler.  LEFT VENTRICLE PLAX 2D LVIDd:         4.30 cm  Diastology LVIDs:         2.48  cm  LV e' lateral:   9.14 cm/s LV PW:         0.98 cm  LV E/e' lateral: 8.8 LV IVS:        0.83 cm  LV e' medial:    7.62 cm/s LVOT diam:     1.90 cm  LV E/e' medial:  10.6 LVOT Area:     2.84 cm  RIGHT VENTRICLE RV S prime:     12.30 cm/s TAPSE (M-mode): 2.4 cm LEFT ATRIUM             Index LA diam:        3.70 cm 2.27 cm/m LA Vol (A2C):   40.8 ml 25.00 ml/m LA Vol (A4C):   33.8 ml 20.71 ml/m LA Biplane Vol: 39.1 ml 23.95 ml/m   AORTA Ao Root diam: 2.60 cm MITRAL VALVE               TRICUSPID VALVE MV Area (PHT): 3.08 cm    TR Peak grad:   23.2 mmHg MV Decel Time: 246 msec    TR Vmax:        241.00 cm/s MV E velocity: 80.60 cm/s MV A velocity: 59.60 cm/s  SHUNTS MV E/A ratio:  1.35        Systemic Diam: 1.90 cm Rozann Lesches MD Electronically signed by Rozann Lesches MD Signature Date/Time: 09/21/2019/12:11:58 PM    Final     Labs:  CBC: Recent Labs    07/20/19 1610 09/20/19 1558 09/21/19 0421 09/22/19 0417  WBC 5.8 11.4* 6.8 4.5  HGB 14.4 12.4 10.5* 10.1*  HCT 42.3 36.8 32.6* 32.1*  PLT 281 436* 366 382    COAGS: Recent Labs    09/20/19 1558  INR 1.2    BMP: Recent Labs    07/20/19 1610 09/20/19 1558 09/21/19 0421 09/22/19 0417  NA 134* 128* 134* 136  K 4.5 3.6 4.0 4.0  CL 96* 94* 101 104  CO2 28 22 23 26   GLUCOSE 96 100* 89 77  BUN 9 10 10  7*  CALCIUM 9.5 9.0 8.7* 8.4*  CREATININE 0.92 0.74 0.73 0.80  GFRNONAA 67 >60 >60 >60  GFRAA 78 >60 >60 >60    LIVER FUNCTION TESTS: Recent Labs    03/25/19 1444 07/20/19 1610 09/20/19 1558 09/21/19 0421  BILITOT 0.5 0.6 0.7 0.7  AST 18 15 24 16   ALT 10 10 29 22   ALKPHOS  --   --  83 65  PROT 7.6 7.5 8.4* 6.9  ALBUMIN  --   --  3.3* 2.7*    TUMOR MARKERS: No results for input(s): AFPTM, CEA, CA199, CHROMGRNA in the last 8760 hours.  Assessment and Plan:  Appendiceal abscess Scheduled for IR drain placement at [facility] today Risks and benefits discussed with the patient including bleeding, infection, damage  to adjacent structures, bowel perforation/fistula connection, and sepsis.  All of the patient's questions were answered, patient is agreeable to proceed. Consent signed and in chart.   Thank you for this interesting consult.  I greatly enjoyed meeting Danielle Morris and look forward to participating in their care.  A copy of this report was sent to the requesting provider on this date.  Electronically Signed: Lavonia Drafts, PA-C 09/22/2019, 7:55 AM   I spent a total of 40 Minutes    in face to face in clinical consultation, greater than 50% of which was counseling/coordinating care for appendiceal abscess drain placement

## 2019-09-22 NOTE — Procedures (Signed)
  Procedure: CT guided RLQ transgluteal pelvic drain placement   EBL:   minimal Complications:  none immediate  See full dictation in [facility].  Dillard Cannon MD Main # [phone] Pager  [phone]

## 2019-09-22 NOTE — Sedation Documentation (Signed)
Repositioning patient due to placement needed for MD.

## 2019-09-23 DIAGNOSIS — R1031 Right lower quadrant pain: Secondary | ICD-10-CM | POA: Diagnosis not present

## 2019-09-23 DIAGNOSIS — K3532 Acute appendicitis with perforation and localized peritonitis, without abscess: Secondary | ICD-10-CM | POA: Diagnosis not present

## 2019-09-23 DIAGNOSIS — K3533 Acute appendicitis with perforation and localized peritonitis, with abscess: Secondary | ICD-10-CM | POA: Diagnosis not present

## 2019-09-23 DIAGNOSIS — E782 Mixed hyperlipidemia: Secondary | ICD-10-CM | POA: Diagnosis not present

## 2019-09-23 NOTE — Progress Notes (Signed)
PROGRESS NOTE  WYATT LEEMING [id number] DOB: February 13, 1956 DOA: 09/20/2019 PCP: Susy Frizzle, MD  Brief History:  64 year old female with a history of hypertension, hyperlipidemia, tobacco abuse, GERD presenting with 2-week history of generalized abdominal pain, worsening in the right lower quadrant.  She describes the pain as severe and colicky worsening with food.  The patient notes that she has had loose bowel movements for about a month without any hematochezia or melena.  She had some subjective chills without any fevers.  She has had some loose stools, but denies any hematochezia, melena, dysuria, hematuria.  She denies any headache, shortness of breath, coughing, hemoptysis.  She had an episode of chest pain on 09/19/2019 lasting 5 seconds and mid left chest.  She did not have any associated nausea, diaphoresis, shortness of breath.  She continues to smoke 1/2 pack/day.  She was at over 50-pack-year history. In the emergency department, the patient was afebrile hemodynamically stable with oxygen saturation 100% room air.  BMP showed a sodium of 128.  Otherwise was unremarkable.  LFTs were unremarkable.  WBC 11.4, hemoglobin 12.4, platelets 136,000.  Lipase 35.  CT of the abdomen and pelvis showed a 9.5 x 4.5 x 5.0 cm rim-enhancing fluid collection in the vicinity of the appendix close to the terminal ileum.  There was right hydronephrosis with distal third of the right ureter effaced and obstructed by overlying fluid collection.  The patient was started on Zosyn.  General surgery was consulted to assist with management.  Assessment/Plan: Intra-abdominal abscess secondary to perforated appendix -Continue Zosyn for now -General surgery consultation appreciated; will follow further recommendations. -Advance diet further as per general surgery rec's -s/p drain placement by IR on 09/22/19 -continue intermittent flushing. -maintain adequate oral hydration.  -Continue IV opioids for  pain control  Hyponatremia -Patient has chronic hyponatremia likely secondary to Zoloft -FeNa--0.17% -Acute on chronic hyponatremia secondary to volume depletion secondary to vomiting and loose stools. -Continue supplementation and follow electrolytes trend. -repeat BMET in am  Tobacco abuse -She has over 50-pack-year history -Stable on room air -Cessation counseling provided.  Essential hypertension -Continue holding losartan secondary to soft blood pressure  Hyperlipidemia -Patient states that she quit taking Lipitor 1 month ago -She felt there was causing her abdominal pain and diarrhea; recommend resumption at time of discharge.  Generalized anxiety disorder -Restart sertraline when able to tolerate p.o.  Abnormal EKG -TWI inferior leads -Echo -personally reviewed EKG--sinus, TWI--II, III, aVF new since Jan 2019   Disposition Plan: Patient From: Home D/C Place: Home- 2-3  Days Barriers: Not Clinically Stable--surgical evaluation for intraabdominal abscess requiring IV abx; S/p interventional radiology's percutaneous drain placement on 4/14; planning to have diet advanced and if tolerated home on oral antibiotics on 09/24/19.  Follow follow general surgery recommendations.  Family Communication:  No Family at bedside  Consultants:  General surgery, IR.  Code Status:  FULL   DVT Prophylaxis:  [place] Heparin    Procedures: As Listed in Progress Note Above  Antibiotics: Zosyn 4/12>>>   Subjective: Very little discomfort in RLQ after drain placement, no nausea, no vomiting, tolerating clear liquids.  Patient is afebrile.  Objective: Vitals:   09/22/19 2215 09/23/19 0120 09/23/19 1439 09/23/19 1442  BP: 134/84 130/80  (!) 166/81  Pulse: 65 (!) 53  66  Resp: 20 16 18 20   Temp: 98.4 F (36.9 C) 97.8 F (36.6 C) 97.8 F (36.6 C)   TempSrc: Oral Oral  Oral   SpO2: 99% 100%  100%  Weight:      Height:        Intake/Output Summary (Last 24 hours) at 09/23/2019  1634 Last data filed at 09/23/2019 1413 Gross per 24 hour  Intake 6832.49 ml  Output --  Net 6832.49 ml   Weight change:   Exam: General exam: Alert, awake, oriented x 3; reports improvement in her right lower quadrant pain and has so far tolerated liquid diet.  Is afebrile and in no major distress.  No nausea or vomiting reported. Respiratory system: Clear to auscultation. Respiratory effort normal. Cardiovascular system:RRR. No murmurs, rubs, gallops. Gastrointestinal system: Abdomen is nondistended, soft and very little tenderness on palpation in her right lower quadrant. No organomegaly or masses felt. Normal bowel sounds heard. Central nervous system: Alert and oriented. No focal neurological deficits. Extremities: No C/C/E, +pedal pulses Skin: No rashes, lesions or ulcers Psychiatry: Judgement and insight appear normal. Mood & affect appropriate.   Data Reviewed: I have personally reviewed following labs and imaging studies  Basic Metabolic Panel: Recent Labs  Lab 09/20/19 1558 09/21/19 0421 09/22/19 0417  NA 128* 134* 136  K 3.6 4.0 4.0  CL 94* 101 104  CO2 22 23 26   GLUCOSE 100* 89 77  BUN 10 10 7*  CREATININE 0.74 0.73 0.80  CALCIUM 9.0 8.7* 8.4*  MG 2.1 2.1 2.0   Liver Function Tests: Recent Labs  Lab 09/20/19 1558 09/21/19 0421  AST 24 16  ALT 29 22  ALKPHOS 83 65  BILITOT 0.7 0.7  PROT 8.4* 6.9  ALBUMIN 3.3* 2.7*   Recent Labs  Lab 09/20/19 1558  LIPASE 35   Coagulation Profile: Recent Labs  Lab 09/20/19 1558  INR 1.2   CBC: Recent Labs  Lab 09/20/19 1558 09/21/19 0421 09/22/19 0417  WBC 11.4* 6.8 4.5  NEUTROABS 9.7* 5.0  --   HGB 12.4 10.5* 10.1*  HCT 36.8 32.6* 32.1*  MCV 90.4 93.4 95.0  PLT 436* 366 382   Urine analysis:    Component Value Date/Time   COLORURINE YELLOW 09/20/2019 2125   APPEARANCEUR CLEAR 09/20/2019 2125   LABSPEC 1.015 09/20/2019 2125   PHURINE 6.0 09/20/2019 2125   GLUCOSEU NEGATIVE 09/20/2019 2125    HGBUR SMALL (A) 09/20/2019 2125   BILIRUBINUR NEGATIVE 09/20/2019 2125   [place] NEGATIVE 09/20/2019 2125   PROTEINUR NEGATIVE 09/20/2019 2125   UROBILINOGEN 0.2 10/23/2013 2120   NITRITE NEGATIVE 09/20/2019 2125   LEUKOCYTESUR NEGATIVE 09/20/2019 2125    Recent Results (from the past 240 hour(s))  Respiratory Panel by RT PCR (Flu A&B, Covid) - Nasopharyngeal Swab     Status: None   Collection Time: 09/20/19  8:10 PM   Specimen: Nasopharyngeal Swab  Result Value Ref Range Status   SARS Coronavirus 2 by RT PCR NEGATIVE NEGATIVE Final    Comment: (NOTE) SARS-CoV-2 target nucleic acids are NOT DETECTED. The SARS-CoV-2 RNA is generally detectable in upper respiratoy specimens during the acute phase of infection. The lowest concentration of SARS-CoV-2 viral copies this assay can detect is 131 copies/mL. A negative result does not preclude SARS-Cov-2 infection and should not be used as the sole basis for treatment or other patient management decisions. A negative result may occur with  improper specimen collection/handling, submission of specimen other than nasopharyngeal swab, presence of viral mutation(s) within the areas targeted by this assay, and inadequate number of viral copies (<131 copies/mL). A negative result must be combined with clinical observations, patient history,  and epidemiological information. The expected result is Negative. Fact Sheet for Patients:  PinkCheek.be Fact Sheet for Healthcare Providers:  GravelBags.it This test is not yet ap proved or cleared by the [place] FDA and  has been authorized for detection and/or diagnosis of SARS-CoV-2 by FDA under an Emergency Use Authorization (EUA). This EUA will remain  in effect (meaning this test can be used) for the duration of the COVID-19 declaration under Section 564(b)(1) of the Act, 21 U.S.C. section 360bbb-3(b)(1), unless the authorization is  terminated or revoked sooner.    Influenza A by PCR NEGATIVE NEGATIVE Final   Influenza B by PCR NEGATIVE NEGATIVE Final    Comment: (NOTE) The Xpert Xpress SARS-CoV-2/FLU/RSV assay is intended as an aid in  the diagnosis of influenza from Nasopharyngeal swab specimens and  should not be used as a sole basis for treatment. Nasal washings and  aspirates are unacceptable for Xpert Xpress SARS-CoV-2/FLU/RSV  testing. Fact Sheet for Patients: PinkCheek.be Fact Sheet for Healthcare Providers: GravelBags.it This test is not yet approved or cleared by the [place] FDA and  has been authorized for detection and/or diagnosis of SARS-CoV-2 by  FDA under an Emergency Use Authorization (EUA). This EUA will remain  in effect (meaning this test can be used) for the duration of the  Covid-19 declaration under Section 564(b)(1) of the Act, 21  U.S.C. section 360bbb-3(b)(1), unless the authorization is  terminated or revoked. Performed at [facility], [address], [place], [place] [address]   Culture, blood (Routine X 2) w Reflex to ID Panel     Status: None (Preliminary result)   Collection Time: 09/20/19  9:39 PM   Specimen: Left Antecubital; Blood  Result Value Ref Range Status   Specimen Description LEFT ANTECUBITAL  Final   Special Requests   Final    BOTTLES DRAWN AEROBIC AND ANAEROBIC Blood Culture adequate volume   Culture   Final    NO GROWTH 2 DAYS Performed at [facility], [address] [place], [place] [address]    Report Status PENDING  Incomplete  Culture, blood (Routine X 2) w Reflex to ID Panel     Status: None (Preliminary result)   Collection Time: 09/20/19  9:41 PM   Specimen: BLOOD RIGHT ARM  Result Value Ref Range Status   Specimen Description BLOOD RIGHT ARM  Final   Special Requests   Final    BOTTLES DRAWN AEROBIC AND ANAEROBIC Blood Culture adequate volume   Culture   Final    NO GROWTH 2  DAYS Performed at [facility], [address] [place], [place] [address]    Report Status PENDING  Incomplete  Aerobic/Anaerobic Culture (surgical/deep wound)     Status: None (Preliminary result)   Collection Time: 09/22/19  2:06 PM   Specimen: Abscess  Result Value Ref Range Status   Specimen Description ABSCESS  Final   Special Requests DRAINAGE RLQ TRANSGLUTEAL PELVIC  Final   Gram Stain   Final    FEW WBC PRESENT,BOTH PMN AND MONONUCLEAR FEW GRAM POSITIVE RODS FEW GRAM NEGATIVE RODS RARE GRAM POSITIVE COCCI    Culture   Final    TOO YOUNG TO READ Performed at [facility], [place] [address] [place], [place] [address]    Report Status PENDING  Incomplete     Scheduled Meds:  feeding supplement  1 Container Oral TID BM   sodium chloride flush  3 mL Intravenous Q12H   sodium chloride flush  5 mL  Intracatheter Q8H   Continuous Infusions:  lactated ringers 125 mL/hr at 09/22/19 0958   piperacillin-tazobactam (ZOSYN)  IV 3.375 g (09/23/19 1413)    Procedures/Studies: CT ABDOMEN PELVIS W CONTRAST  Result Date: 09/20/2019 CLINICAL DATA:  Abdominal pain for 2 weeks, neutropenia EXAM: CT ABDOMEN AND PELVIS WITH CONTRAST TECHNIQUE: Multidetector CT imaging of the abdomen and pelvis was performed using the standard protocol following bolus administration of intravenous contrast. CONTRAST:  145mL OMNIPAQUE IOHEXOL 300 MG/ML  SOLN COMPARISON:  None. FINDINGS: Lower chest: No acute abnormality.  Coronary artery disease. Hepatobiliary: No solid liver abnormality is seen. Rim calcified gallstone in the gallbladder. No gallbladder wall thickening, or biliary dilatation. Pancreas: Unremarkable. No pancreatic ductal dilatation or surrounding inflammatory changes. Spleen: Normal in size without significant abnormality. Adrenals/Urinary Tract: Adrenal glands are unremarkable. Moderate right hydronephrosis and hydroureter, the distal third of the right ureter appears effaced and  obstructed by overlying fluid collection. The left kidney is normal. Bladder is unremarkable. Stomach/Bowel: Stomach is within normal limits. There is a large, rim enhancing fluid collection in the expected vicinity of the appendix measuring 9.5 x 4.5 x 5.0 cm (series 2, image 62). There appears to be a dependent calcified appendicolith. This collection is very closely apposed to the terminal loop of ileum, which is thickened appearing. No evidence of bowel wall thickening, distention, or inflammatory changes. Vascular/Lymphatic: Aortic atherosclerosis. No enlarged abdominal or pelvic lymph nodes. Reproductive: No mass or other significant abnormality. Other: No abdominal wall hernia or abnormality. Trace fluid in the right lower quadrant. Musculoskeletal: No acute or significant osseous findings. IMPRESSION: 1. There is a large, rim enhancing fluid collection in the expected vicinity of the appendix measuring 9.5 x 4.5 x 5.0 cm, most consistent with an unusually large appendiceal abscess. 2. The terminal loop of ileum adjacent to this collection is thickened appearing and very closely apposed, likely reactive to adjacent infection. 3. Moderate right hydronephrosis and hydroureter, the distal third of the right ureter appears effaced and obstructed by overlying fluid collection. 4.  Cholelithiasis. 5.  Coronary artery disease.  Aortic Atherosclerosis (ICD10-I70.0). Electronically Signed   By: Eddie Candle M.D.   On: 09/20/2019 17:31   ECHOCARDIOGRAM COMPLETE  Result Date: 09/21/2019    ECHOCARDIOGRAM REPORT   Patient Name:   DAMIAN BALDASSARRE Date of Exam: 09/21/2019 Medical Rec #:  [id number]    Height:       67.0 in Accession #:    [id number]   Weight:       120.8 lb Date of Birth:  June 20, 1955   BSA:          71.632 m Patient Age:    43 years     BP:           100/69 mmHg Patient Gender: F            HR:           59 bpm. Exam Location:  [facility] Procedure: 2D Echo Indications:    Abnormal ECG 794.31 / R94.31   History:        Patient has no prior history of Echocardiogram examinations.                 Risk Factors:Current Smoker, Hypertension and Dyslipidemia.                 Perforated appendix.  Sonographer:    Leavy Cella RDCS (AE) Referring Phys: [phone] DAVID TAT IMPRESSIONS  1. Left ventricular ejection fraction, by  estimation, is 70 to 75%. The left ventricle has hyperdynamic function. The left ventricle has no regional wall motion abnormalities. Left ventricular diastolic parameters were normal.  2. Right ventricular systolic function is normal. The right ventricular size is normal. There is normal pulmonary artery systolic pressure. The estimated right ventricular systolic pressure is [id number] mmHg.  3. The mitral valve is grossly normal. Mild mitral valve regurgitation.  4. The aortic valve has an indeterminant number of cusps and is moderately sclerotic without stenosis. Aortic valve regurgitation is not visualized.  5. The inferior vena cava is normal in size with greater than 50% respiratory variability, suggesting right atrial pressure of 3 mmHg. FINDINGS  Left Ventricle: Left ventricular ejection fraction, by estimation, is 70 to 75%. The left ventricle has hyperdynamic function. The left ventricle has no regional wall motion abnormalities. The left ventricular internal cavity size was normal in size. There is no left ventricular hypertrophy. Left ventricular diastolic parameters were normal. Right Ventricle: The right ventricular size is normal. No increase in right ventricular wall thickness. Right ventricular systolic function is normal. There is normal pulmonary artery systolic pressure. The tricuspid regurgitant velocity is 2.41 m/s, and  with an assumed right atrial pressure of 3 mmHg, the estimated right ventricular systolic pressure is [id number] mmHg. Left Atrium: Left atrial size was normal in size. Right Atrium: Right atrial size was normal in size. Pericardium: There is no evidence of pericardial  effusion. Presence of pericardial fat pad. Mitral Valve: The mitral valve is grossly normal. Mild mitral valve regurgitation. Tricuspid Valve: The tricuspid valve is grossly normal. Tricuspid valve regurgitation is trivial. Aortic Valve: The aortic valve has an indeterminant number of cusps. Aortic valve regurgitation is not visualized. Mild aortic valve annular calcification. There is moderate calcification of the aortic valve. Pulmonic Valve: The pulmonic valve was not well visualized. Pulmonic valve regurgitation is not visualized. Aorta: The aortic root is normal in size and structure. Venous: The inferior vena cava is normal in size with greater than 50% respiratory variability, suggesting right atrial pressure of 3 mmHg. IAS/Shunts: No atrial level shunt detected by color flow Doppler.  LEFT VENTRICLE PLAX 2D LVIDd:         4.30 cm  Diastology LVIDs:         2.48 cm  LV e' lateral:   9.14 cm/s LV PW:         0.98 cm  LV E/e' lateral: 8.8 LV IVS:        0.83 cm  LV e' medial:    7.62 cm/s LVOT diam:     1.90 cm  LV E/e' medial:  10.6 LVOT Area:     2.84 cm  RIGHT VENTRICLE RV S prime:     12.30 cm/s TAPSE (M-mode): 2.4 cm LEFT ATRIUM             Index LA diam:        3.70 cm 2.27 cm/m LA Vol (A2C):   40.8 ml 25.00 ml/m LA Vol (A4C):   33.8 ml 20.71 ml/m LA Biplane Vol: 39.1 ml 23.95 ml/m   AORTA Ao Root diam: 2.60 cm MITRAL VALVE               TRICUSPID VALVE MV Area (PHT): 3.08 cm    TR Peak grad:   23.2 mmHg MV Decel Time: 246 msec    TR Vmax:        241.00 cm/s MV E velocity: 80.60 cm/s MV A velocity: 59.60 cm/s  SHUNTS MV E/A ratio:  1.35        Systemic Diam: 1.90 cm Rozann Lesches MD Electronically signed by Rozann Lesches MD Signature Date/Time: 09/21/2019/12:11:58 PM    Final    CT IMAGE GUIDED DRAINAGE BY PERCUTANEOUS CATHETER  Result Date: 09/22/2019 CLINICAL DATA:  Appendicitis with abscess.  Drainage requested. EXAM: CT GUIDED DRAINAGE OF RIGHT PELVIC ABSCESS ANESTHESIA/SEDATION:  Intravenous Fentanyl 154mcg and Versed 2.5mg  were administered as conscious sedation during continuous monitoring of the patient's level of consciousness and physiological / cardiorespiratory status by the radiology RN, with a total moderate sedation time of 33 minutes. PROCEDURE: The procedure, risks, benefits, and alternatives were explained to the patient. Questions regarding the procedure were encouraged and answered. The patient understands and consents to the procedure. initially, patient was placed supine left posterior oblique in limited axial scans through the pelvis obtained. the complex collection was again localized but no safe percutaneous approach was available. Therefore, the patient was placed prone left anterior oblique. Select axial scans were obtained and a safe percutaneous transgluteal approach was identified and marked. The operative field was prepped with chlorhexidinein a sterile fashion, and a sterile drape was applied covering the operative field. A sterile gown and sterile gloves were used for the procedure. Local anesthesia was provided with 1% Lidocaine. Under CT fluoroscopic guidance, 18 gauge trocar needle advanced into the collection. A 035 Amplatz wire advanced easily within the collection. Tract dilated to facilitate placement of a 12 French pigtail drain catheter, formed centrally within the collection. Approximately 20 mL of feculent thin material were aspirated, sent for Gram stain and culture. Catheter secured externally with 0 Prolene suture and StatLock and placed to gravity drain bag. The patient tolerated the procedure well. COMPLICATIONS: None immediate FINDINGS: The fluid component of the complex right pelvic abscess had partially decompressed, there was some gas noted within the abscess. 12 French pigtail drain catheter placed as above. Sample of the aspirate sent for Gram stain and culture. IMPRESSION: 1. Technically successful CT-guided right pelvic abscess drain  catheter placement Electronically Signed   By: Lucrezia Europe M.D.   On: 09/22/2019 15:59    Barton Dubois, MD  [facility]  09/23/2019, 4:34 PM   LOS: 3 days

## 2019-09-23 NOTE — Progress Notes (Signed)
[facility] Progress Note     Subjective: Saw this AM. Doing fair. Has gluteal drain that is in the pelvic collection. Says it hurts. Worried her husband could not care for the drain but this has been resolved today. Abdominal pain improved.   Objective: Vital signs in last 24 hours: Temp:  [97.8 F (36.6 C)-98.6 F (37 C)] 97.8 F (36.6 C) (04/15 1439) Pulse Rate:  [53-70] 66 (04/15 1442) Resp:  [16-20] 20 (04/15 1442) BP: (129-166)/(69-84) 166/81 (04/15 1442) SpO2:  [99 %-100 %] 100 % (04/15 1442) Last BM Date: 09/22/19  Intake/Output from previous day: 04/14 0701 - 04/15 0700 In: 5625.5 [P.O.:120; I.V.:5210.9; IV Piggyback:294.5] Out: -  Intake/Output this shift: Total I/O In: 1207 [P.O.:240; I.V.:923.6; IV Piggyback:43.4] Out: -   General appearance: alert, cooperative and no distress Resp: normal work of breathing GI: soft, nondistended, tender lower abdomen, gluteal drain in place, drain with dark brownish fluid Extremities: extremities normal, atraumatic, no cyanosis or edema  Lab Results:  Recent Labs    09/21/19 0421 09/22/19 0417  WBC 6.8 4.5  HGB 10.5* 10.1*  HCT 32.6* 32.1*  PLT 366 382   BMET Recent Labs    09/21/19 0421 09/22/19 0417  NA 134* 136  K 4.0 4.0  CL 101 104  CO2 23 26  GLUCOSE 89 77  BUN 10 7*  CREATININE 0.73 0.80  CALCIUM 8.7* 8.4*   PT/INR No results for input(s): LABPROT, INR in the last 72 hours.  Studies/Results: CT IMAGE GUIDED DRAINAGE BY PERCUTANEOUS CATHETER  Result Date: 09/22/2019 CLINICAL DATA:  Appendicitis with abscess.  Drainage requested. EXAM: CT GUIDED DRAINAGE OF RIGHT PELVIC ABSCESS ANESTHESIA/SEDATION: Intravenous Fentanyl 157mcg and Versed 2.5mg  were administered as conscious sedation during continuous monitoring of the patient's level of consciousness and physiological / cardiorespiratory status by the radiology RN, with a total moderate sedation time of 33 minutes. PROCEDURE: The  procedure, risks, benefits, and alternatives were explained to the patient. Questions regarding the procedure were encouraged and answered. The patient understands and consents to the procedure. initially, patient was placed supine left posterior oblique in limited axial scans through the pelvis obtained. the complex collection was again localized but no safe percutaneous approach was available. Therefore, the patient was placed prone left anterior oblique. Select axial scans were obtained and a safe percutaneous transgluteal approach was identified and marked. The operative field was prepped with chlorhexidinein a sterile fashion, and a sterile drape was applied covering the operative field. A sterile gown and sterile gloves were used for the procedure. Local anesthesia was provided with 1% Lidocaine. Under CT fluoroscopic guidance, 18 gauge trocar needle advanced into the collection. A 035 Amplatz wire advanced easily within the collection. Tract dilated to facilitate placement of a 12 French pigtail drain catheter, formed centrally within the collection. Approximately 20 mL of feculent thin material were aspirated, sent for Gram stain and culture. Catheter secured externally with 0 Prolene suture and StatLock and placed to gravity drain bag. The patient tolerated the procedure well. COMPLICATIONS: None immediate FINDINGS: The fluid component of the complex right pelvic abscess had partially decompressed, there was some gas noted within the abscess. 12 French pigtail drain catheter placed as above. Sample of the aspirate sent for Gram stain and culture. IMPRESSION: 1. Technically successful CT-guided right pelvic abscess drain catheter placement Electronically Signed   By: Lucrezia Europe M.D.   On: 09/22/2019 15:59    Anti-infectives: Anti-infectives (From admission, onward)   Start  Dose/Rate Route Frequency Ordered Stop   09/20/19 2200  piperacillin-tazobactam (ZOSYN) IVPB 3.375 g     3.375 g 12.5 mL/hr  over 240 Minutes Intravenous Every 8 hours 09/20/19 2139     09/20/19 1830  piperacillin-tazobactam (ZOSYN) IVPB 3.375 g     3.375 g 100 mL/hr over 30 Minutes Intravenous  Once 09/20/19 1815 09/20/19 2010      Assessment/Plan: Danielle Morris is a 64 yo with perforated appendicitis s/p IR drain to the pelvic abscess through the gluteal muscle. Doing fair but having pain associated with this.  -Diet as tolerated -PRN for pain control -IR flush 5cc daily at home, husband being educated -Will get follow up with me and IR -IV zosyn for now and cultures pending   Discussed with patient plan for IR drainage, repeat imaging and drain management, and eventual colonoscopy and the appendectomy 8+ weeks down the road.  Discussed with Dr. Dyann Kief, IR PA and Social work.    LOS: 3 days    Danielle Morris 09/23/2019

## 2019-09-23 NOTE — Plan of Care (Signed)

## 2019-09-23 NOTE — Progress Notes (Signed)
Referring Physician(s): Dr Hayes Ludwig  Supervising Physician: Markus Daft  Patient Status:  AP IP  Chief Complaint:  Perforated appendix-- abscess  Subjective:   Procedure:  yesterday: CT guided RLQ transgluteal pelvic drain placement  Resting- feels some better TG drain is uncomfortable OP dark bloody   Allergies: Amlodipine, Tramadol, Hydrocodone, Lisinopril, and Chantix [varenicline]  Medications: Prior to Admission medications   Medication Sig Start Date End Date Taking? Authorizing Provider  atorvastatin (LIPITOR) 40 MG tablet Take 1 tablet (40 mg total) by mouth daily. 07/28/19  Yes Susy Frizzle, MD  docusate sodium (STOOL SOFTENER) 100 MG capsule Take 100 mg by mouth daily.   Yes [provider]  doxylamine, Sleep, (UNISOM) 25 MG tablet Take 25 mg by mouth at bedtime as needed for sleep.   Yes [provider]  losartan (COZAAR) 100 MG tablet TAKE 1 TABLET BY MOUTH DAILY Patient taking differently: Take 100 mg by mouth daily.  03/29/19  Yes [place], Modena Nunnery, MD  sertraline (ZOLOFT) 50 MG tablet TAKE 1 TABLET(50 MG) BY MOUTH DAILY Patient taking differently: Take 50 mg by mouth daily.  06/21/19  Yes Susy Frizzle, MD     Vital Signs: BP 130/80 (BP Location: Left Arm)   Pulse (!) 53   Temp 97.8 F (36.6 C) (Oral)   Resp 16   Ht 5\' 7"  (1.702 m)   Wt 126 lb 1.7 oz (57.2 kg)   SpO2 100%   BMI 19.75 kg/m   Physical Exam Skin:    General: Skin is warm and dry.     Comments: Skin site is clean and dry NT no bleeding Flushes easily 20 cc in bag OP dark/bloody  Gram Stain FEW WBC PRESENT,BOTH PMN AND MONONUCLEAR  FEW GRAM POSITIVE RODS  FEW GRAM NEGATIVE RODS  RARE GRAM POSITIVE COCCI       Imaging: CT ABDOMEN PELVIS W CONTRAST  Result Date: 09/20/2019 CLINICAL DATA:  Abdominal pain for 2 weeks, neutropenia EXAM: CT ABDOMEN AND PELVIS WITH CONTRAST TECHNIQUE: Multidetector CT imaging of the abdomen and pelvis was  performed using the standard protocol following bolus administration of intravenous contrast. CONTRAST:  137mL OMNIPAQUE IOHEXOL 300 MG/ML  SOLN COMPARISON:  None. FINDINGS: Lower chest: No acute abnormality.  Coronary artery disease. Hepatobiliary: No solid liver abnormality is seen. Rim calcified gallstone in the gallbladder. No gallbladder wall thickening, or biliary dilatation. Pancreas: Unremarkable. No pancreatic ductal dilatation or surrounding inflammatory changes. Spleen: Normal in size without significant abnormality. Adrenals/Urinary Tract: Adrenal glands are unremarkable. Moderate right hydronephrosis and hydroureter, the distal third of the right ureter appears effaced and obstructed by overlying fluid collection. The left kidney is normal. Bladder is unremarkable. Stomach/Bowel: Stomach is within normal limits. There is a large, rim enhancing fluid collection in the expected vicinity of the appendix measuring 9.5 x 4.5 x 5.0 cm (series 2, image 62). There appears to be a dependent calcified appendicolith. This collection is very closely apposed to the terminal loop of ileum, which is thickened appearing. No evidence of bowel wall thickening, distention, or inflammatory changes. Vascular/Lymphatic: Aortic atherosclerosis. No enlarged abdominal or pelvic lymph nodes. Reproductive: No mass or other significant abnormality. Other: No abdominal wall hernia or abnormality. Trace fluid in the right lower quadrant. Musculoskeletal: No acute or significant osseous findings. IMPRESSION: 1. There is a large, rim enhancing fluid collection in the expected vicinity of the appendix measuring 9.5 x 4.5 x 5.0 cm, most consistent with an unusually large appendiceal abscess.  2. The terminal loop of ileum adjacent to this collection is thickened appearing and very closely apposed, likely reactive to adjacent infection. 3. Moderate right hydronephrosis and hydroureter, the distal third of the right ureter appears effaced  and obstructed by overlying fluid collection. 4.  Cholelithiasis. 5.  Coronary artery disease.  Aortic Atherosclerosis (ICD10-I70.0). Electronically Signed   By: Eddie Candle M.D.   On: 09/20/2019 17:31   ECHOCARDIOGRAM COMPLETE  Result Date: 09/21/2019    ECHOCARDIOGRAM REPORT   Patient Name:   Danielle Morris Date of Exam: 09/21/2019 Medical Rec #:  [id number]    Height:       67.0 in Accession #:    [id number]   Weight:       120.8 lb Date of Birth:  Oct 18, 1955   BSA:          55.632 m Patient Age:    64 years     BP:           100/69 mmHg Patient Gender: F            HR:           59 bpm. Exam Location:  [facility] Procedure: 2D Echo Indications:    Abnormal ECG 794.31 / R94.31  History:        Patient has no prior history of Echocardiogram examinations.                 Risk Factors:Current Smoker, Hypertension and Dyslipidemia.                 Perforated appendix.  Sonographer:    Leavy Cella RDCS (AE) Referring Phys: [phone] DAVID TAT IMPRESSIONS  1. Left ventricular ejection fraction, by estimation, is 70 to 75%. The left ventricle has hyperdynamic function. The left ventricle has no regional wall motion abnormalities. Left ventricular diastolic parameters were normal.  2. Right ventricular systolic function is normal. The right ventricular size is normal. There is normal pulmonary artery systolic pressure. The estimated right ventricular systolic pressure is [id number] mmHg.  3. The mitral valve is grossly normal. Mild mitral valve regurgitation.  4. The aortic valve has an indeterminant number of cusps and is moderately sclerotic without stenosis. Aortic valve regurgitation is not visualized.  5. The inferior vena cava is normal in size with greater than 50% respiratory variability, suggesting right atrial pressure of 3 mmHg. FINDINGS  Left Ventricle: Left ventricular ejection fraction, by estimation, is 70 to 75%. The left ventricle has hyperdynamic function. The left ventricle has no regional wall motion  abnormalities. The left ventricular internal cavity size was normal in size. There is no left ventricular hypertrophy. Left ventricular diastolic parameters were normal. Right Ventricle: The right ventricular size is normal. No increase in right ventricular wall thickness. Right ventricular systolic function is normal. There is normal pulmonary artery systolic pressure. The tricuspid regurgitant velocity is 2.41 m/s, and  with an assumed right atrial pressure of 3 mmHg, the estimated right ventricular systolic pressure is [id number] mmHg. Left Atrium: Left atrial size was normal in size. Right Atrium: Right atrial size was normal in size. Pericardium: There is no evidence of pericardial effusion. Presence of pericardial fat pad. Mitral Valve: The mitral valve is grossly normal. Mild mitral valve regurgitation. Tricuspid Valve: The tricuspid valve is grossly normal. Tricuspid valve regurgitation is trivial. Aortic Valve: The aortic valve has an indeterminant number of cusps. Aortic valve regurgitation is not visualized. Mild aortic valve annular calcification. There is moderate calcification of  the aortic valve. Pulmonic Valve: The pulmonic valve was not well visualized. Pulmonic valve regurgitation is not visualized. Aorta: The aortic root is normal in size and structure. Venous: The inferior vena cava is normal in size with greater than 50% respiratory variability, suggesting right atrial pressure of 3 mmHg. IAS/Shunts: No atrial level shunt detected by color flow Doppler.  LEFT VENTRICLE PLAX 2D LVIDd:         4.30 cm  Diastology LVIDs:         2.48 cm  LV e' lateral:   9.14 cm/s LV PW:         0.98 cm  LV E/e' lateral: 8.8 LV IVS:        0.83 cm  LV e' medial:    7.62 cm/s LVOT diam:     1.90 cm  LV E/e' medial:  10.6 LVOT Area:     2.84 cm  RIGHT VENTRICLE RV S prime:     12.30 cm/s TAPSE (M-mode): 2.4 cm LEFT ATRIUM             Index LA diam:        3.70 cm 2.27 cm/m LA Vol (A2C):   40.8 ml 25.00 ml/m LA Vol  (A4C):   33.8 ml 20.71 ml/m LA Biplane Vol: 39.1 ml 23.95 ml/m   AORTA Ao Root diam: 2.60 cm MITRAL VALVE               TRICUSPID VALVE MV Area (PHT): 3.08 cm    TR Peak grad:   23.2 mmHg MV Decel Time: 246 msec    TR Vmax:        241.00 cm/s MV E velocity: 80.60 cm/s MV A velocity: 59.60 cm/s  SHUNTS MV E/A ratio:  1.35        Systemic Diam: 1.90 cm Rozann Lesches MD Electronically signed by Rozann Lesches MD Signature Date/Time: 09/21/2019/12:11:58 PM    Final    CT IMAGE GUIDED DRAINAGE BY PERCUTANEOUS CATHETER  Result Date: 09/22/2019 CLINICAL DATA:  Appendicitis with abscess.  Drainage requested. EXAM: CT GUIDED DRAINAGE OF RIGHT PELVIC ABSCESS ANESTHESIA/SEDATION: Intravenous Fentanyl 16mcg and Versed 2.5mg  were administered as conscious sedation during continuous monitoring of the patient's level of consciousness and physiological / cardiorespiratory status by the radiology RN, with a total moderate sedation time of 33 minutes. PROCEDURE: The procedure, risks, benefits, and alternatives were explained to the patient. Questions regarding the procedure were encouraged and answered. The patient understands and consents to the procedure. initially, patient was placed supine left posterior oblique in limited axial scans through the pelvis obtained. the complex collection was again localized but no safe percutaneous approach was available. Therefore, the patient was placed prone left anterior oblique. Select axial scans were obtained and a safe percutaneous transgluteal approach was identified and marked. The operative field was prepped with chlorhexidinein a sterile fashion, and a sterile drape was applied covering the operative field. A sterile gown and sterile gloves were used for the procedure. Local anesthesia was provided with 1% Lidocaine. Under CT fluoroscopic guidance, 18 gauge trocar needle advanced into the collection. A 035 Amplatz wire advanced easily within the collection. Tract dilated to  facilitate placement of a 12 French pigtail drain catheter, formed centrally within the collection. Approximately 20 mL of feculent thin material were aspirated, sent for Gram stain and culture. Catheter secured externally with 0 Prolene suture and StatLock and placed to gravity drain bag. The patient tolerated the procedure well. COMPLICATIONS: None immediate FINDINGS: The  fluid component of the complex right pelvic abscess had partially decompressed, there was some gas noted within the abscess. 12 French pigtail drain catheter placed as above. Sample of the aspirate sent for Gram stain and culture. IMPRESSION: 1. Technically successful CT-guided right pelvic abscess drain catheter placement Electronically Signed   By: Lucrezia Europe M.D.   On: 09/22/2019 15:59    Labs:  CBC: Recent Labs    07/20/19 1610 09/20/19 1558 09/21/19 0421 09/22/19 0417  WBC 5.8 11.4* 6.8 4.5  HGB 14.4 12.4 10.5* 10.1*  HCT 42.3 36.8 32.6* 32.1*  PLT 281 436* 366 382    COAGS: Recent Labs    09/20/19 1558  INR 1.2    BMP: Recent Labs    07/20/19 1610 09/20/19 1558 09/21/19 0421 09/22/19 0417  NA 134* 128* 134* 136  K 4.5 3.6 4.0 4.0  CL 96* 94* 101 104  CO2 28 22 23 26   GLUCOSE 96 100* 89 77  BUN 9 10 10  7*  CALCIUM 9.5 9.0 8.7* 8.4*  CREATININE 0.92 0.74 0.73 0.80  GFRNONAA 67 >60 >60 >60  GFRAA 78 >60 >60 >60    LIVER FUNCTION TESTS: Recent Labs    03/25/19 1444 07/20/19 1610 09/20/19 1558 09/21/19 0421  BILITOT 0.5 0.6 0.7 0.7  AST 18 15 24 16   ALT 10 10 29 22   ALKPHOS  --   --  83 65  PROT 7.6 7.5 8.4* 6.9  ALBUMIN  --   --  3.3* 2.7*    Assessment and Plan:  Perforated appendix Abscess collection Drain placement in IR 4/14 Rt TG Drain intact-- will follow Needs to flush every shift in house Flush daily at home- 5 cc sterile saline (please be sure pt has Rx for flushes at DC) IR scheduler will call pt for follow up if home with drain OP orders in place  Electronically  Signed: Lavonia Drafts, PA-C 09/23/2019, 8:50 AM   I spent a total of 15 Minutes at the the patient's bedside AND on the patient's hospital floor or unit, greater than 50% of which was counseling/coordinating care for TG abscess drain

## 2019-09-23 NOTE — TOC Transition Note (Signed)
Transition of Care [facility]) - CM/SW Discharge Note   Patient Details  Name: Danielle Morris MRN: [id number] Date of Birth: [birth year]  [facility] CM/SW Contact:  Dellene Mcgroarty, Chauncey Reading, RN Phone Number: 09/23/2019, 10:45 AM   Clinical Narrative:  CT guided RLQ transgluteal pelvic drain placementfor perforated appendix. Discussed discharge plan with patient and husband. Husband has worked out plan with his work place to come check on patient several times a day and is agreeable to flushing drain. Discussed with bedside RN who will educate husband on drain care [place]. Children available to help also.        Final next level of care: Home/Self Care Barriers to Discharge: Barriers Resolved    Discharge Plan and Services   Discharge Planning Services: CM Consult                                 Social Determinants of Health (SDOH) Interventions     Readmission Risk Interventions No flowsheet data found.

## 2019-09-24 ENCOUNTER — Other Ambulatory Visit: Payer: Self-pay | Admitting: General Surgery

## 2019-09-24 DIAGNOSIS — K3533 Acute appendicitis with perforation and localized peritonitis, with abscess: Secondary | ICD-10-CM

## 2019-09-24 DIAGNOSIS — K3532 Acute appendicitis with perforation and localized peritonitis, without abscess: Secondary | ICD-10-CM | POA: Diagnosis not present

## 2019-09-24 DIAGNOSIS — F411 Generalized anxiety disorder: Secondary | ICD-10-CM | POA: Diagnosis not present

## 2019-09-24 DIAGNOSIS — I10 Essential (primary) hypertension: Secondary | ICD-10-CM | POA: Diagnosis not present

## 2019-09-24 LAB — AEROBIC/ANAEROBIC CULTURE W GRAM STAIN (SURGICAL/DEEP WOUND)

## 2019-09-24 MED ORDER — ONDANSETRON 8 MG PO TBDP: 8.0000 mg | ORAL_TABLET | Freq: Three times a day (TID) | ORAL | 0 refills | Status: DC | PRN

## 2019-09-24 MED ORDER — OXYCODONE HCL 5 MG PO TABS: 5.0000 mg | ORAL_TABLET | Freq: Four times a day (QID) | ORAL | 0 refills | Status: AC | PRN

## 2019-09-24 MED ORDER — NICOTINE 14 MG/24HR TD PT24: 14.0000 mg | MEDICATED_PATCH | Freq: Every day | TRANSDERMAL | 0 refills | Status: DC | PRN

## 2019-09-24 MED ORDER — SODIUM CHLORIDE FLUSH 0.9 % IV SOLN: INTRAVENOUS | 0 refills | Status: DC

## 2019-09-24 MED ORDER — ATORVASTATIN CALCIUM 40 MG PO TABS: 20.0000 mg | ORAL_TABLET | Freq: Every day | ORAL | 3 refills | Status: DC

## 2019-09-24 MED ORDER — LOSARTAN POTASSIUM 100 MG PO TABS: 50.0000 mg | ORAL_TABLET | Freq: Every day | ORAL | Status: DC

## 2019-09-24 MED ORDER — AMOXICILLIN-POT CLAVULANATE 875-125 MG PO TABS: 1.0000 | ORAL_TABLET | Freq: Two times a day (BID) | ORAL | 0 refills | Status: AC

## 2019-09-24 NOTE — Progress Notes (Signed)
[facility] Progress Note     Subjective: Doing well. Husband to care for drain. Tolerating diet.   Objective: Vital signs in last 24 hours: Temp:  [97.5 F (36.4 C)-98 F (36.7 C)] 97.5 F (36.4 C) (04/16 0442) Pulse Rate:  [54-66] 54 (04/16 0442) Resp:  [16-20] 16 (04/16 0442) BP: (117-166)/(65-81) 117/65 (04/16 0442) SpO2:  [98 %-100 %] 98 % (04/16 0442) Last BM Date: 09/19/19  Intake/Output from previous day: 04/15 0701 - 04/16 0700 In: 2158.7 [P.O.:480; I.V.:1635.2; IV Piggyback:43.4] Out: 10 [Drains:10] Intake/Output this shift: Total I/O In: 525.1 [I.V.:374.6; Other:10; IV Piggyback:140.6] Out: 25 [Drains:25]  General appearance: alert, cooperative and no distress Resp: normal work of breathing GI: soft, mildly tender lower abdomen, gluteal drain, dark fluid  Lab Results:  Recent Labs    09/22/19 0417  WBC 4.5  HGB 10.1*  HCT 32.1*  PLT 382   BMET Recent Labs    09/22/19 0417  NA 136  K 4.0  CL 104  CO2 26  GLUCOSE 77  BUN 7*  CREATININE 0.80  CALCIUM 8.4*   PT/INR No results for input(s): LABPROT, INR in the last 72 hours.  Studies/Results: CT IMAGE GUIDED DRAINAGE BY PERCUTANEOUS CATHETER  Result Date: 09/22/2019 CLINICAL DATA:  Appendicitis with abscess.  Drainage requested. EXAM: CT GUIDED DRAINAGE OF RIGHT PELVIC ABSCESS ANESTHESIA/SEDATION: Intravenous Fentanyl 171mcg and Versed 2.5mg  were administered as conscious sedation during continuous monitoring of the patient's level of consciousness and physiological / cardiorespiratory status by the radiology RN, with a total moderate sedation time of 33 minutes. PROCEDURE: The procedure, risks, benefits, and alternatives were explained to the patient. Questions regarding the procedure were encouraged and answered. The patient understands and consents to the procedure. initially, patient was placed supine left posterior oblique in limited axial scans through the pelvis obtained. the  complex collection was again localized but no safe percutaneous approach was available. Therefore, the patient was placed prone left anterior oblique. Select axial scans were obtained and a safe percutaneous transgluteal approach was identified and marked. The operative field was prepped with chlorhexidinein a sterile fashion, and a sterile drape was applied covering the operative field. A sterile gown and sterile gloves were used for the procedure. Local anesthesia was provided with 1% Lidocaine. Under CT fluoroscopic guidance, 18 gauge trocar needle advanced into the collection. A 035 Amplatz wire advanced easily within the collection. Tract dilated to facilitate placement of a 12 French pigtail drain catheter, formed centrally within the collection. Approximately 20 mL of feculent thin material were aspirated, sent for Gram stain and culture. Catheter secured externally with 0 Prolene suture and StatLock and placed to gravity drain bag. The patient tolerated the procedure well. COMPLICATIONS: None immediate FINDINGS: The fluid component of the complex right pelvic abscess had partially decompressed, there was some gas noted within the abscess. 12 French pigtail drain catheter placed as above. Sample of the aspirate sent for Gram stain and culture. IMPRESSION: 1. Technically successful CT-guided right pelvic abscess drain catheter placement Electronically Signed   By: Lucrezia Europe M.D.   On: 09/22/2019 15:59    Anti-infectives: Anti-infectives (From admission, onward)   Start     Dose/Rate Route Frequency Ordered Stop   09/20/19 2200  piperacillin-tazobactam (ZOSYN) IVPB 3.375 g     3.375 g 12.5 mL/hr over 240 Minutes Intravenous Every 8 hours 09/20/19 2139     09/20/19 1830  piperacillin-tazobactam (ZOSYN) IVPB 3.375 g     3.375 g 100 mL/hr over 30  Minutes Intravenous  Once 09/20/19 1815 09/20/19 2010      Assessment/Plan: Danielle Morris is a 64 yo with what we think is perforated appendicitis. IR drain  done.  Diet as tolerated Complete 14 day course of antibiotics  Will see her in the office 4/22 to check on her and check on the drain.  IR will be seeing her in 2 weeks. Will need colonoscopy prior to surgery given rectal polyp history   Updated patient, husband, and Dr. Dyann Kief.    LOS: 4 days    Danielle Morris 09/24/2019

## 2019-09-24 NOTE — Progress Notes (Signed)
Discussed AVS including medications & need for follow up appointments with the patient & patient's husband and all questioned fully answered to the best of my abilities. Patient taken with personal belongings to discharge area, where ride was waiting.

## 2019-09-24 NOTE — Discharge Instructions (Signed)
Please keep the drain clean and dry. Replace the gauze/ tape around the drain if it gets dirty or wet/ saturated. Please do not mess with or cut the stitch that is keeping the drain in place. Secure the drain to your clothes so that it does not get dislodged.   Please record the output from the drain daily including the color and the amount in milliliters.  Please flush the drain as instructed by your nurse. Please keep the drain covered with plastic and tape when you shower so that it does not get wet.   Discharge Instructions:  Diet/ Activity: Diet as tolerated. You may not have an appetite, but it is important to stay hydrated. Drink 64 ounces of water a day.  Your appetite will return with time.  Shower per your regular routine daily. Keep the drain covered and do not get it wet. Walk everyday for at least 15-20 minutes. Deep cough and move around every 1-2 hours in the first few days after surgery.    Pain Expectations and Narcotics: -After a procedure you will have pain associated with your incisions and this is normal. The pain is muscular and nerve pain, and will get better with time. -You are encouraged and expected to take non narcotic medications like tylenol and ibuprofen (when able) to treat pain as multiple modalities can aid with pain treatment. -Narcotics are only used when pain is severe or there is breakthrough pain. -You are not expected to have a pain score of 0 after a procedure or perforate appendicitis, as we cannot prevent pain. A pain score of 3-4 that allows you to be functional, move, walk, and tolerate some activity is the goal. The pain will continue to improve over the days after surgery and is dependent on your surgery. -Due to [place] law, we are only able to give a certain amount of pain medication to treat post operative pain, and we only give additional narcotics on a patient by patient basis.  -Having appropriate expectations of pain and knowledge of pain  management with non narcotics is important as we do not want anyone to become addicted to narcotic pain medication.  -Using ice packs in the first 48 hours and heating pads after 48 hours, and using over the counter medications are all ways to help with pain management.   -Simple acts like meditation and mindfulness practices after surgery can also help with pain control and research has proven the benefit of these practices.  Medication: Take tylenol and ibuprofen as needed for pain control, alternating every 4-6 hours.  Example:  Tylenol 1000mg  @ 6am, 12noon, 6pm, 73midnight (Do not exceed 4000mg  of tylenol a day). Ibuprofen 800mg  @ 9am, 3pm, 9pm, 3am (Do not exceed 3600mg  of ibuprofen a day).  Take Roxicodone for breakthrough pain every 4 hours.  Take Colace for constipation related to narcotic pain medication. If you do not have a bowel movement in 2 days, take Miralax over the counter.  Drink plenty of water to also prevent constipation.   Contact Information: If you have questions or concerns, please call our office, [phone], Monday- Thursday 8AM-5PM and Friday 8AM-12Noon.  If it is after hours or on the weekend, please call [facility], [phone], and ask to speak to the surgeon on call for Dr. Constance Haw at [facility].    Appendicitis, Adult  The appendix is a tube in the body that is shaped like a finger. It is attached to the large intestine. Appendicitis means that this  tube is swollen (inflamed). If this is not treated, the tube can tear (rupture). This can lead to a life-threatening infection. This condition can also cause pus to build up in the appendix (abscess). What are the causes? This condition may be caused by something that blocks the appendix. These include:  A ball of poop (stool).  Lymph glands that are bigger than normal. Sometimes the cause is not known. What increases the risk? You are more likely to develop this condition if you are between 10 and  29 years of age. What are the signs or symptoms? Symptoms of this condition include:  Pain around the belly button (navel). ? The pain moves toward the lower right belly (abdomen). ? The pain can get worse with time. ? The pain can get worse if you cough. ? The pain can get worse if you move suddenly.  Tenderness in the lower right belly.  Feeling sick to your stomach (nauseous).  Throwing up (vomiting).  Not feeling hungry (loss of appetite).  A fever.  Having trouble pooping (constipation).  Watery poop (diarrhea).  Not feeling well. How is this treated? Most often, this condition is treated by taking out the appendix (appendectomy). There are two ways to do this:  Open surgery. For this method, the appendix is taken out through a large cut (incision). The cut is made in the lower right belly. This surgery may be used if: ? You have scars from another surgery. ? You have a bleeding condition. ? You are pregnant and will be having your baby soon. ? You have a condition that makes it hard to do the other type of surgery.  Laparoscopic surgery. For this method, the appendix is taken out through small cuts. Often, this surgery: ? Causes less pain. ? Causes fewer problems. ? Is easier to heal from. If your appendix tears and pus forms:  A drain may be put into the sore. The drain will be used to get rid of the pus.  You may get an antibiotic medicine through an IV line.  Your appendix may or may not need to be taken out. Follow these instructions at home: If you had surgery, follow instructions from your doctor on how to care for yourself at home and how to take care of your cut from surgery. Medicines  Take over-the-counter and prescription medicines only as told by your doctor.  If you were prescribed an antibiotic medicine, take it as told by your doctor. Do not stop taking the antibiotic even if you start to feel better. Eating and drinking Follow instructions  from your doctor about what you cannot eat or drink. You may go back to your diet slowly if:  You no longer feel sick to your stomach.  You have stopped throwing up. General instructions  Do not use any products that contain nicotine or tobacco, such as cigarettes, e-cigarettes, and chewing tobacco. If you need help quitting, ask your doctor.  Do not drive or use heavy machinery while taking prescription pain medicine.  Ask your doctor if the medicine you are taking can cause trouble pooping. You may need to take steps to prevent or treat trouble pooping: ? Drink enough fluid to keep your pee (urine) pale yellow. ? Take over-the-counter or prescription medicines. ? Eat foods that are high in fiber. These include beans, whole grains, and fresh fruits and vegetables. ? Limit foods that are high in fat and sugar. These include fried or sweet foods.  Keep all follow-up  visits as told by your doctor. This is important. Contact a doctor if:  There is pus, blood, or a lot of fluid coming from your cut or cuts from surgery.  You are sick to your stomach or you throw up. Get help right away if:  You have pain in your belly, and the pain is getting worse.  You have a fever.  You have chills.  You are very tired.  You have muscle pain.  You are short of breath. Summary  Appendicitis is swelling of the appendix. The appendix is a tube that is shaped like a finger. It is joined to the large intestine.  This condition may be caused by something that blocks the appendix. This can lead to an infection.  This condition is usually treated by taking out the appendix. This information is not intended to replace advice given to you by your health care provider. Make sure you discuss any questions you have with your health care provider. Document Revised: 11/12/2017 Document Reviewed: 11/12/2017 Elsevier Patient Education  [place].

## 2019-09-24 NOTE — Plan of Care (Signed)
  Problem: Education: Goal: Knowledge of General Education information will improve Description: Including pain rating scale, medication(s)/side effects and non-pharmacologic comfort measures Outcome: Progressing   Problem: Nutrition: Goal: Adequate nutrition will be maintained Outcome: Progressing   

## 2019-09-24 NOTE — Discharge Summary (Signed)
Physician Discharge Summary  Danielle Morris [id number] DOB: [birth year] DOA: 09/20/2019  PCP: Susy Frizzle, MD  Admit date: 09/20/2019 Discharge date: 09/24/2019  Time spent: 35  minutes  Recommendations for Outpatient Follow-up:  Repeat basic metabolic panel to follow electrolytes renal function Reassess blood pressure and further adjust antihypertensive regimen as required Repeat CBC to follow WBCs trend.  Discharge Diagnoses:  Principal Problem:   Perforated appendix Active Problems:   Hypertension   Generalized anxiety disorder   Hyperlipemia   Tobacco abuse   Abdominal pain   Nausea and vomiting   Hyponatremia   Abscess of appendix   Discharge Condition: Stable and improved.  Patient discharged home with instruction to follow-up with PCP, interventional radiology drain clinic and general surgery.  CODE STATUS: Full code.  Diet recommendation: Heart healthy diet  Filed Weights   09/20/19 1514 09/20/19 2244 09/22/19 0500  Weight: 54.4 kg 54.8 kg 57.2 kg    History of present illness:  64 year old female with a history of hypertension, hyperlipidemia, tobacco abuse, GERD presenting with 2-week history of generalized abdominal pain, worsening in the right lower quadrant.  She describes the pain as severe and colicky worsening with food.  The patient notes that she has had loose bowel movements for about a month without any hematochezia or melena.  She had some subjective chills without any fevers.  She has had some loose stools, but denies any hematochezia, melena, dysuria, hematuria.  She denies any headache, shortness of breath, coughing, hemoptysis.  She had an episode of chest pain on 09/19/2019 lasting 5 seconds and mid left chest.  She did not have any associated nausea, diaphoresis, shortness of breath.  She continues to smoke 1/2 pack/day.  She was at over 50-pack-year history. In the emergency department, the patient was afebrile hemodynamically stable with  oxygen saturation 100% room air.  BMP showed a sodium of 128.  Otherwise was unremarkable.  LFTs were unremarkable.  WBC 11.4, hemoglobin 12.4, platelets 136,000.  Lipase 35.  CT of the abdomen and pelvis showed a 9.5 x 4.5 x 5.0 cm rim-enhancing fluid collection in the vicinity of the appendix close to the terminal ileum.  There was right hydronephrosis with distal third of the right ureter effaced and obstructed by overlying fluid collection.  The patient was started on Zosyn.  General surgery was consulted to assist with management.  Hospital Course:  Intra-abdominal abscess secondary to perforated appendix -Continue Zosyn for now -General surgery consultation appreciated; will follow further recommendations. -Advance diet further as per general surgery rec's -s/p drain placement by IR on 09/22/19 -continue intermittent flushing as instructed by IR. -Instructed to maintain adequate oral hydration.  -Continue as needed analgesics and antiemetics (prescription for oxycodone and Zofran provided at discharge).  Hyponatremia -Patient has chronic hyponatremia likely secondary to Zoloft -FeNa--0.17% -Acute on chronic hyponatremia secondary to volume depletion secondary to vomiting and loose stools. -Continue to maintain adequate hydration. -repeat BMET at follow-up visit.  Tobacco abuse -She has over 50-pack-year history -Cessation counseling provided. -Nicotine patch prescribed at discharge.  Essential hypertension -BP stable at discharge -losartan restarted at half usual dose -Commending follow-up with patient's vital signs (blood pressure) with further adjustment to antihypertensive regimen as required. -Heart healthy diet encouraged.  Hyperlipidemia -Patient states that she quit taking Lipitor 1 month ago -She felt there was causing her abdominal pain and diarrhea; recommend resumption at time of discharge. -repeat lipid panel dn LFT's as an outpatient.  Generalized anxiety  disorder -Resume sertraline -mood  stable.  Abnormal EKG -TWI inferior leads -Echo -personally reviewed EKG--sinus, TWI--II, III, aVF new since Jan 2019 -no CP or SOB.   Procedures:  Percutaneous drainage of  Appendix abscess on 09/22/19  Consultations:  General surgery   Discharge Exam: Vitals:   09/23/19 2020 09/24/19 0442  BP: 127/77 117/65  Pulse: 61 (!) 54  Resp: 17 16  Temp: 98 F (36.7 C) (!) 97.5 F (36.4 C)  SpO2: 100% 98%   General exam: Alert, awake, oriented x 3; reports improvement in her right lower quadrant pain and has so far tolerated liquid diet.  Is afebrile and in no major distress.  No nausea or vomiting reported. Respiratory system: Clear to auscultation. Respiratory effort normal. Cardiovascular system:RRR. No murmurs, rubs, gallops. Gastrointestinal system: Abdomen is nondistended, soft and very little tenderness on palpation in her right lower quadrant. No organomegaly or masses felt. Normal bowel sounds heard. Central nervous system: Alert and oriented. No focal neurological deficits. Extremities: No C/C/E, +pedal pulses Skin: No rashes, lesions or ulcers Psychiatry: Judgement and insight appear normal. Mood & affect appropriate.    Discharge Instructions   Discharge Instructions    Diet - low sodium heart healthy   Complete by: As directed    Discharge instructions   Complete by: As directed    Take medications as prescribed Follow soft/low residue diet Maintain adequate hydration Follow-up with interventional radiology drain clinic and general surgery as instructed. Follow-up with PCP in 2 weeks, for hospital follow up.     Allergies as of 09/24/2019      Reactions   Amlodipine Shortness Of Breath, Anxiety, Palpitations   Tramadol    Rushes thru body, miserable, shaking and crying   Hydrocodone Nausea And Vomiting   Lisinopril Other (See Comments)   unknown   Chantix [varenicline] Nausea Only, Anxiety      Medication List     TAKE these medications   amoxicillin-clavulanate 875-125 MG tablet Commonly known as: Augmentin Take 1 tablet by mouth 2 (two) times daily for 14 days.   atorvastatin 40 MG tablet Commonly known as: LIPITOR Take 0.5 tablets (20 mg total) by mouth daily. What changed: how much to take   doxylamine (Sleep) 25 MG tablet Commonly known as: UNISOM Take 25 mg by mouth at bedtime as needed for sleep.   losartan 100 MG tablet Commonly known as: COZAAR Take 0.5 tablets (50 mg total) by mouth daily. What changed: how much to take   nicotine 14 mg/24hr patch Commonly known as: NICODERM CQ - dosed in mg/24 hours Place 1 patch (14 mg total) onto the skin daily as needed (smoking cessation).   ondansetron 8 MG disintegrating tablet Commonly known as: Zofran ODT Take 1 tablet (8 mg total) by mouth every 8 (eight) hours as needed for nausea or vomiting.   oxyCODONE 5 MG immediate release tablet Commonly known as: Roxicodone Take 1 tablet (5 mg total) by mouth every 6 (six) hours as needed for up to 7 days for severe pain.   sertraline 50 MG tablet Commonly known as: ZOLOFT TAKE 1 TABLET(50 MG) BY MOUTH DAILY What changed: See the new instructions.   sodium chloride flush 0.9 % Soln injection Please inject through drain 5ML on daily basis.   Stool Softener 100 MG capsule Generic drug: docusate sodium Take 100 mg by mouth daily.      Allergies  Allergen Reactions  . Amlodipine Shortness Of Breath, Anxiety and Palpitations  . Tramadol     Rushes thru  body, miserable, shaking and crying  . Hydrocodone Nausea And Vomiting  . Lisinopril Other (See Comments)    unknown  . Chantix [Varenicline] Nausea Only and Anxiety   Follow-up Information    Arne Cleveland, MD Follow up in 2 week(s).   Specialties: Interventional Radiology, Radiology Why: flush drain once daily; record output; call [phone] if any questions/concerns; pt will hear from scheduler for appt time and  date Contact information: [address] [place] [address] [phone]        Virl Cagey, MD Follow up on 09/30/2019.   Specialty: General Surgery Why: someone from the office will call with a time, if you do not hear by Monday, call the office and ask Contact information: [address] [place] [facility] [address] [phone]           The results of significant diagnostics from this hospitalization (including imaging, microbiology, ancillary and laboratory) are listed below for reference.    Significant Diagnostic Studies: CT ABDOMEN PELVIS W CONTRAST  Result Date: 09/20/2019 CLINICAL DATA:  Abdominal pain for 2 weeks, neutropenia EXAM: CT ABDOMEN AND PELVIS WITH CONTRAST TECHNIQUE: Multidetector CT imaging of the abdomen and pelvis was performed using the standard protocol following bolus administration of intravenous contrast. CONTRAST:  136mL OMNIPAQUE IOHEXOL 300 MG/ML  SOLN COMPARISON:  None. FINDINGS: Lower chest: No acute abnormality.  Coronary artery disease. Hepatobiliary: No solid liver abnormality is seen. Rim calcified gallstone in the gallbladder. No gallbladder wall thickening, or biliary dilatation. Pancreas: Unremarkable. No pancreatic ductal dilatation or surrounding inflammatory changes. Spleen: Normal in size without significant abnormality. Adrenals/Urinary Tract: Adrenal glands are unremarkable. Moderate right hydronephrosis and hydroureter, the distal third of the right ureter appears effaced and obstructed by overlying fluid collection. The left kidney is normal. Bladder is unremarkable. Stomach/Bowel: Stomach is within normal limits. There is a large, rim enhancing fluid collection in the expected vicinity of the appendix measuring 9.5 x 4.5 x 5.0 cm (series 2, image 62). There appears to be a dependent calcified appendicolith. This collection is very closely apposed to the terminal loop of ileum, which is thickened appearing. No evidence of  bowel wall thickening, distention, or inflammatory changes. Vascular/Lymphatic: Aortic atherosclerosis. No enlarged abdominal or pelvic lymph nodes. Reproductive: No mass or other significant abnormality. Other: No abdominal wall hernia or abnormality. Trace fluid in the right lower quadrant. Musculoskeletal: No acute or significant osseous findings. IMPRESSION: 1. There is a large, rim enhancing fluid collection in the expected vicinity of the appendix measuring 9.5 x 4.5 x 5.0 cm, most consistent with an unusually large appendiceal abscess. 2. The terminal loop of ileum adjacent to this collection is thickened appearing and very closely apposed, likely reactive to adjacent infection. 3. Moderate right hydronephrosis and hydroureter, the distal third of the right ureter appears effaced and obstructed by overlying fluid collection. 4.  Cholelithiasis. 5.  Coronary artery disease.  Aortic Atherosclerosis (ICD10-I70.0). Electronically Signed   By: Eddie Candle M.D.   On: 09/20/2019 17:31   ECHOCARDIOGRAM COMPLETE  Result Date: 09/21/2019    ECHOCARDIOGRAM REPORT   Patient Name:   Danielle Morris Date of Exam: 09/21/2019 Medical Rec #:  [id number]    Height:       67.0 in Accession #:    [id number]   Weight:       120.8 lb Date of Birth:  1955/10/14   BSA:          1.632 m Patient Age:  63 years     BP:           100/69 mmHg Patient Gender: F            HR:           59 bpm. Exam Location:  [facility] Procedure: 2D Echo Indications:    Abnormal ECG 794.31 / R94.31  History:        Patient has no prior history of Echocardiogram examinations.                 Risk Factors:Current Smoker, Hypertension and Dyslipidemia.                 Perforated appendix.  Sonographer:    Leavy Cella RDCS (AE) Referring Phys: [phone] DAVID TAT IMPRESSIONS  1. Left ventricular ejection fraction, by estimation, is 70 to 75%. The left ventricle has hyperdynamic function. The left ventricle has no regional wall motion abnormalities. Left  ventricular diastolic parameters were normal.  2. Right ventricular systolic function is normal. The right ventricular size is normal. There is normal pulmonary artery systolic pressure. The estimated right ventricular systolic pressure is [id number] mmHg.  3. The mitral valve is grossly normal. Mild mitral valve regurgitation.  4. The aortic valve has an indeterminant number of cusps and is moderately sclerotic without stenosis. Aortic valve regurgitation is not visualized.  5. The inferior vena cava is normal in size with greater than 50% respiratory variability, suggesting right atrial pressure of 3 mmHg. FINDINGS  Left Ventricle: Left ventricular ejection fraction, by estimation, is 70 to 75%. The left ventricle has hyperdynamic function. The left ventricle has no regional wall motion abnormalities. The left ventricular internal cavity size was normal in size. There is no left ventricular hypertrophy. Left ventricular diastolic parameters were normal. Right Ventricle: The right ventricular size is normal. No increase in right ventricular wall thickness. Right ventricular systolic function is normal. There is normal pulmonary artery systolic pressure. The tricuspid regurgitant velocity is 2.41 m/s, and  with an assumed right atrial pressure of 3 mmHg, the estimated right ventricular systolic pressure is [id number] mmHg. Left Atrium: Left atrial size was normal in size. Right Atrium: Right atrial size was normal in size. Pericardium: There is no evidence of pericardial effusion. Presence of pericardial fat pad. Mitral Valve: The mitral valve is grossly normal. Mild mitral valve regurgitation. Tricuspid Valve: The tricuspid valve is grossly normal. Tricuspid valve regurgitation is trivial. Aortic Valve: The aortic valve has an indeterminant number of cusps. Aortic valve regurgitation is not visualized. Mild aortic valve annular calcification. There is moderate calcification of the aortic valve. Pulmonic Valve: The pulmonic  valve was not well visualized. Pulmonic valve regurgitation is not visualized. Aorta: The aortic root is normal in size and structure. Venous: The inferior vena cava is normal in size with greater than 50% respiratory variability, suggesting right atrial pressure of 3 mmHg. IAS/Shunts: No atrial level shunt detected by color flow Doppler.  LEFT VENTRICLE PLAX 2D LVIDd:         4.30 cm  Diastology LVIDs:         2.48 cm  LV e' lateral:   9.14 cm/s LV PW:         0.98 cm  LV E/e' lateral: 8.8 LV IVS:        0.83 cm  LV e' medial:    7.62 cm/s LVOT diam:     1.90 cm  LV E/e' medial:  10.6 LVOT Area:  2.84 cm  RIGHT VENTRICLE RV S prime:     12.30 cm/s TAPSE (M-mode): 2.4 cm LEFT ATRIUM             Index LA diam:        3.70 cm 2.27 cm/m LA Vol (A2C):   40.8 ml 25.00 ml/m LA Vol (A4C):   33.8 ml 20.71 ml/m LA Biplane Vol: 39.1 ml 23.95 ml/m   AORTA Ao Root diam: 2.60 cm MITRAL VALVE               TRICUSPID VALVE MV Area (PHT): 3.08 cm    TR Peak grad:   23.2 mmHg MV Decel Time: 246 msec    TR Vmax:        241.00 cm/s MV E velocity: 80.60 cm/s MV A velocity: 59.60 cm/s  SHUNTS MV E/A ratio:  1.35        Systemic Diam: 1.90 cm Rozann Lesches MD Electronically signed by Rozann Lesches MD Signature Date/Time: 09/21/2019/12:11:58 PM    Final    CT IMAGE GUIDED DRAINAGE BY PERCUTANEOUS CATHETER  Result Date: 09/22/2019 CLINICAL DATA:  Appendicitis with abscess.  Drainage requested. EXAM: CT GUIDED DRAINAGE OF RIGHT PELVIC ABSCESS ANESTHESIA/SEDATION: Intravenous Fentanyl 156mcg and Versed 2.5mg  were administered as conscious sedation during continuous monitoring of the patient's level of consciousness and physiological / cardiorespiratory status by the radiology RN, with a total moderate sedation time of 33 minutes. PROCEDURE: The procedure, risks, benefits, and alternatives were explained to the patient. Questions regarding the procedure were encouraged and answered. The patient understands and consents to the  procedure. initially, patient was placed supine left posterior oblique in limited axial scans through the pelvis obtained. the complex collection was again localized but no safe percutaneous approach was available. Therefore, the patient was placed prone left anterior oblique. Select axial scans were obtained and a safe percutaneous transgluteal approach was identified and marked. The operative field was prepped with chlorhexidinein a sterile fashion, and a sterile drape was applied covering the operative field. A sterile gown and sterile gloves were used for the procedure. Local anesthesia was provided with 1% Lidocaine. Under CT fluoroscopic guidance, 18 gauge trocar needle advanced into the collection. A 035 Amplatz wire advanced easily within the collection. Tract dilated to facilitate placement of a 12 French pigtail drain catheter, formed centrally within the collection. Approximately 20 mL of feculent thin material were aspirated, sent for Gram stain and culture. Catheter secured externally with 0 Prolene suture and StatLock and placed to gravity drain bag. The patient tolerated the procedure well. COMPLICATIONS: None immediate FINDINGS: The fluid component of the complex right pelvic abscess had partially decompressed, there was some gas noted within the abscess. 12 French pigtail drain catheter placed as above. Sample of the aspirate sent for Gram stain and culture. IMPRESSION: 1. Technically successful CT-guided right pelvic abscess drain catheter placement Electronically Signed   By: Lucrezia Europe M.D.   On: 09/22/2019 15:59    Microbiology: Recent Results (from the past 240 hour(s))  Respiratory Panel by RT PCR (Flu A&B, Covid) - Nasopharyngeal Swab     Status: None   Collection Time: 09/20/19  8:10 PM   Specimen: Nasopharyngeal Swab  Result Value Ref Range Status   SARS Coronavirus 2 by RT PCR NEGATIVE NEGATIVE Final    Comment: (NOTE) SARS-CoV-2 target nucleic acids are NOT DETECTED. The  SARS-CoV-2 RNA is generally detectable in upper respiratoy specimens during the acute phase of infection. The lowest concentration of SARS-CoV-2  viral copies this assay can detect is 131 copies/mL. A negative result does not preclude SARS-Cov-2 infection and should not be used as the sole basis for treatment or other patient management decisions. A negative result may occur with  improper specimen collection/handling, submission of specimen other than nasopharyngeal swab, presence of viral mutation(s) within the areas targeted by this assay, and inadequate number of viral copies (<131 copies/mL). A negative result must be combined with clinical observations, patient history, and epidemiological information. The expected result is Negative. Fact Sheet for Patients:  PinkCheek.be Fact Sheet for Healthcare Providers:  GravelBags.it This test is not yet ap proved or cleared by the [place] FDA and  has been authorized for detection and/or diagnosis of SARS-CoV-2 by FDA under an Emergency Use Authorization (EUA). This EUA will remain  in effect (meaning this test can be used) for the duration of the COVID-19 declaration under Section 564(b)(1) of the Act, 21 U.S.C. section 360bbb-3(b)(1), unless the authorization is terminated or revoked sooner.    Influenza A by PCR NEGATIVE NEGATIVE Final   Influenza B by PCR NEGATIVE NEGATIVE Final    Comment: (NOTE) The Xpert Xpress SARS-CoV-2/FLU/RSV assay is intended as an aid in  the diagnosis of influenza from Nasopharyngeal swab specimens and  should not be used as a sole basis for treatment. Nasal washings and  aspirates are unacceptable for Xpert Xpress SARS-CoV-2/FLU/RSV  testing. Fact Sheet for Patients: PinkCheek.be Fact Sheet for Healthcare Providers: GravelBags.it This test is not yet approved or cleared by the [place] FDA and  has been authorized for detection and/or diagnosis of SARS-CoV-2 by  FDA under an Emergency Use Authorization (EUA). This EUA will remain  in effect (meaning this test can be used) for the duration of the  Covid-19 declaration under Section 564(b)(1) of the Act, 21  U.S.C. section 360bbb-3(b)(1), unless the authorization is  terminated or revoked. Performed at [facility], [address], [place], [place] [address]   Culture, blood (Routine X 2) w Reflex to ID Panel     Status: None (Preliminary result)   Collection Time: 09/20/19  9:39 PM   Specimen: Left Antecubital; Blood  Result Value Ref Range Status   Specimen Description LEFT ANTECUBITAL  Final   Special Requests   Final    BOTTLES DRAWN AEROBIC AND ANAEROBIC Blood Culture adequate volume   Culture   Final    NO GROWTH 4 DAYS Performed at [facility], [address] [place], [place] [address]    Report Status PENDING  Incomplete  Culture, blood (Routine X 2) w Reflex to ID Panel     Status: None (Preliminary result)   Collection Time: 09/20/19  9:41 PM   Specimen: BLOOD RIGHT ARM  Result Value Ref Range Status   Specimen Description BLOOD RIGHT ARM  Final   Special Requests   Final    BOTTLES DRAWN AEROBIC AND ANAEROBIC Blood Culture adequate volume   Culture   Final    NO GROWTH 4 DAYS Performed at [facility], [address] [place], [place] [address]    Report Status PENDING  Incomplete  Aerobic/Anaerobic Culture (surgical/deep wound)     Status: Abnormal   Collection Time: 09/22/19  2:06 PM   Specimen: Abscess  Result Value Ref Range Status   Specimen Description ABSCESS  Final   Special Requests DRAINAGE RLQ TRANSGLUTEAL PELVIC  Final   Gram Stain   Final    FEW WBC PRESENT,BOTH PMN AND MONONUCLEAR FEW GRAM POSITIVE  RODS FEW GRAM NEGATIVE RODS RARE GRAM POSITIVE COCCI Performed at [facility], [place] [address] [place], [place] [address]    Culture (A)  Final    MULTIPLE ORGANISMS  PRESENT, NONE PREDOMINANT MIXED ANAEROBIC FLORA PRESENT.  CALL LAB IF FURTHER IID REQUIRED.    Report Status 09/24/2019 FINAL  Final     Labs: Basic Metabolic Panel: Recent Labs  Lab 09/20/19 1558 09/21/19 0421 09/22/19 0417  NA 128* 134* 136  K 3.6 4.0 4.0  CL 94* 101 104  CO2 22 23 26   GLUCOSE 100* 89 77  BUN 10 10 7*  CREATININE 0.74 0.73 0.80  CALCIUM 9.0 8.7* 8.4*  MG 2.1 2.1 2.0   Liver Function Tests: Recent Labs  Lab 09/20/19 1558 09/21/19 0421  AST 24 16  ALT 29 22  ALKPHOS 83 65  BILITOT 0.7 0.7  PROT 8.4* 6.9  ALBUMIN 3.3* 2.7*   Recent Labs  Lab 09/20/19 1558  LIPASE 35   CBC: Recent Labs  Lab 09/20/19 1558 09/21/19 0421 09/22/19 0417  WBC 11.4* 6.8 4.5  NEUTROABS 9.7* 5.0  --   HGB 12.4 10.5* 10.1*  HCT 36.8 32.6* 32.1*  MCV 90.4 93.4 95.0  PLT 436* 366 382    Signed:  Barton Dubois MD.  [facility] 09/24/2019, 1:36 PM

## 2019-09-25 LAB — CULTURE, BLOOD (ROUTINE X 2)
Culture: NO GROWTH
Culture: NO GROWTH
Special Requests: ADEQUATE
Special Requests: ADEQUATE

## 2019-09-27 ENCOUNTER — Other Ambulatory Visit: Payer: Self-pay | Admitting: Family Medicine

## 2019-09-27 DIAGNOSIS — I10 Essential (primary) hypertension: Secondary | ICD-10-CM

## 2019-09-27 DIAGNOSIS — F410 Panic disorder [episodic paroxysmal anxiety] without agoraphobia: Secondary | ICD-10-CM

## 2019-09-27 DIAGNOSIS — F411 Generalized anxiety disorder: Secondary | ICD-10-CM

## 2019-09-29 MED FILL — NORMAL SALINE FLUSH SYRINGE: 0.9 | 10 days supply | Qty: 100 | Fill #0

## 2019-10-05 ENCOUNTER — Ambulatory Visit
Admission: RE | Admit: 2019-10-05 | Discharge: 2019-10-05 | Disposition: A | Discharge status: Home / Self Care | Payer: BC Managed Care – PPO | Source: Ambulatory Visit | Attending: Radiology | Admitting: Radiology

## 2019-10-05 ENCOUNTER — Ambulatory Visit
Admission: RE | Admit: 2019-10-05 | Discharge: 2019-10-05 | Disposition: A | Discharge status: Home / Self Care | Payer: BC Managed Care – PPO | Source: Ambulatory Visit | Attending: General Surgery | Admitting: General Surgery

## 2019-10-05 ENCOUNTER — Other Ambulatory Visit: Payer: Self-pay | Admitting: General Surgery

## 2019-10-05 DIAGNOSIS — K3533 Acute appendicitis with perforation and localized peritonitis, with abscess: Secondary | ICD-10-CM

## 2019-10-05 DIAGNOSIS — N2889 Other specified disorders of kidney and ureter: Secondary | ICD-10-CM | POA: Diagnosis not present

## 2019-10-05 HISTORY — PX: IR RADIOLOGIST EVAL & MGMT: IMG5224

## 2019-10-05 MED ORDER — IOPAMIDOL (ISOVUE-300) INJECTION 61%
100.0000 mL | Freq: Once | INTRAVENOUS | Status: AC | PRN
  Administered 2019-10-05: 100 mL via INTRAVENOUS

## 2019-10-05 NOTE — Progress Notes (Signed)
Referring Physician(s): Bridges,L  Chief Complaint: The patient is seen in follow up today s/p CT-guided drainage of right lower quadrant abdominal/appendiceal abscess on 09/22/2019  History of present illness: Danielle Morris is a 64 year old female with history of abdominal pain requiring admission to [facility] on 09/20/2019.  Subsequent imaging revealed large rim-enhancing fluid collection in the vicinity of the appendix most consistent with large appendiceal abscess.  She underwent CT guided drainage of this abscess on 09/22/2019 at [facility]. She was transferred back to [facility] following the procedure.  She was discharged home with  the drain on 09/24/2019.  Drain fluid cultures grew multiple organisms.  She presents today for follow-up CT and drain evaluation. According to the patient she has done well since her discharge.  She is currently on oral Augmentin and has 3 remaining days of therapy.  She denies fever, respiratory issues, nausea, vomiting, bleeding or worsening abdominal pain . She does have some mild soreness at the right transgluteal drain insertion site.  She is currently flushing the drain once a day with 10 cc of saline.  Output has been minimal amount of rust colored fluid.  Past Medical History:  Diagnosis Date  . GAD (generalized anxiety disorder)   . HLD (hyperlipidemia)   . Hypertension   . Panic attack   . Tobacco abuse     Past Surgical History:  Procedure Laterality Date  . COLONOSCOPY WITH PROPOFOL N/A 11/02/2013   Procedure: COLONOSCOPY WITH PROPOFOL;  Surgeon: Danie Binder, MD;  Location: [facility];  Service: Endoscopy;  Laterality: N/A;  in cecum @ 1002; cecal withdrawal time- minutes  . HEMORRHOID BANDING N/A 11/02/2013   Procedure: HEMORRHOID BANDING;  Surgeon: Danie Binder, MD;  Location: [facility];  Service: Endoscopy;  Laterality: N/A;  . HEMORRHOID SURGERY N/A 11/05/2013   Procedure: EXTENSIVE HEMORRHOIDECTOMY ;  Surgeon: Jamesetta So, MD;  Location: [facility];  Service: General;  Laterality: N/A;  . POLYPECTOMY N/A 11/02/2013   Procedure: POLYPECTOMY;  Surgeon: Danie Binder, MD;  Location: [facility];  Service: Endoscopy;  Laterality: N/A;  sigmoid colon, hepatic flexure, hyperplastic rectal, and rectal polyps  . TONSILLECTOMY     age  63    Allergies: Amlodipine, Tramadol, Hydrocodone, Lisinopril, and Chantix [varenicline]  Medications: Prior to Admission medications   Medication Sig Start Date End Date Taking? Authorizing Provider  amoxicillin-clavulanate (AUGMENTIN) 875-125 MG tablet Take 1 tablet by mouth 2 (two) times daily for 14 days. 09/24/19 10/08/19  Barton Dubois, MD  atorvastatin (LIPITOR) 40 MG tablet Take 0.5 tablets (20 mg total) by mouth daily. 09/24/19   Barton Dubois, MD  docusate sodium (STOOL SOFTENER) 100 MG capsule Take 100 mg by mouth daily.    [provider]  doxylamine, Sleep, (UNISOM) 25 MG tablet Take 25 mg by mouth at bedtime as needed for sleep.    [provider]  losartan (COZAAR) 100 MG tablet TAKE 1 TABLET(100 MG) BY MOUTH DAILY 09/27/19   Susy Frizzle, MD  nicotine (NICODERM CQ - DOSED IN MG/24 HOURS) 14 mg/24hr patch Place 1 patch (14 mg total) onto the skin daily as needed (smoking cessation). 09/24/19   Barton Dubois, MD  ondansetron (ZOFRAN ODT) 8 MG disintegrating tablet Take 1 tablet (8 mg total) by mouth every 8 (eight) hours as needed for nausea or vomiting. 09/24/19   Barton Dubois, MD  sertraline (ZOLOFT) 50 MG tablet TAKE 1 TABLET(50 MG) BY MOUTH DAILY 09/27/19  Susy Frizzle, MD  sodium chloride flush 0.9 % SOLN injection Please inject through drain 5ML on daily basis. 09/24/19   Barton Dubois, MD     Family History  Problem Relation Age of Onset  . Heart disease Father 26       MI--H/O Rheumatic Fever as a child  . Hypertension Brother   . Hypertension Brother   . Diabetes Other     Social History   Socioeconomic History  . Marital  status: Married    Spouse name: Not on file  . Number of children: Not on file  . Years of education: Not on file  . Highest education level: Not on file  Occupational History  . Not on file  Tobacco Use  . Smoking status: Current Some Day Smoker    Packs/day: 0.50    Years: 10.00    Pack years: 5.00    Types: Cigarettes  . Smokeless tobacco: Never Used  . Tobacco comment: uses E-cigs  Substance and Sexual Activity  . Alcohol use: No  . Drug use: No  . Sexual activity: Not on file  Other Topics Concern  . Not on file  Social History Narrative   Entered 03/2014:   Married.    2 Daughters and son in law, and 1 grandchild live with them now.    Keeps another grandchild frequently also.   Does not work outside of house.   Social Determinants of Health   Financial Resource Strain:   . Difficulty of Paying Living Expenses:   Food Insecurity:   . Worried About Charity fundraiser in the Last Year:   . Arboriculturist in the Last Year:   Transportation Needs:   . Film/video editor (Medical):   Marland Kitchen Lack of Transportation (Non-Medical):   Physical Activity:   . Days of Exercise per Week:   . Minutes of Exercise per Session:   Stress:   . Feeling of Stress :   Social Connections:   . Frequency of Communication with Friends and Family:   . Frequency of Social Gatherings with Friends and Family:   . Attends Religious Services:   . Active Member of Clubs or Organizations:   . Attends Archivist Meetings:   Marland Kitchen Marital Status:      Vital Signs:Marland Kitchen Vitals:   10/05/19 1300  BP: 126/78  Pulse: 71  Temp: 98.8 F (37.1 C)  SpO2: 99%      Physical Exam awake, alert.  Right transgluteal drain intact, insertion site mildly tender, no significant erythema; small amount of rust colored fluid in gravity bag.  Imaging: No results found.  Labs:  CBC: Recent Labs    07/20/19 1610 09/20/19 1558 09/21/19 0421 09/22/19 0417  WBC 5.8 11.4* 6.8 4.5  HGB 14.4  12.4 10.5* 10.1*  HCT 42.3 36.8 32.6* 32.1*  PLT 281 436* 366 382    COAGS: Recent Labs    09/20/19 1558  INR 1.2    BMP: Recent Labs    07/20/19 1610 09/20/19 1558 09/21/19 0421 09/22/19 0417  NA 134* 128* 134* 136  K 4.5 3.6 4.0 4.0  CL 96* 94* 101 104  CO2 28 22 23 26   GLUCOSE 96 100* 89 77  BUN 9 10 10  7*  CALCIUM 9.5 9.0 8.7* 8.4*  CREATININE 0.92 0.74 0.73 0.80  GFRNONAA 67 >60 >60 >60  GFRAA 78 >60 >60 >60    LIVER FUNCTION TESTS: Recent Labs    03/25/19 1444  07/20/19 1610 09/20/19 1558 09/21/19 0421  BILITOT 0.5 0.6 0.7 0.7  AST 18 15 24 16   ALT 10 10 29 22   ALKPHOS  --   --  83 65  PROT 7.6 7.5 8.4* 6.9  ALBUMIN  --   --  3.3* 2.7*    Assessment: 64 year old female with history of abdominal pain requiring admission to [facility] on 09/20/2019.  Subsequent imaging revealed large rim-enhancing fluid collection in the vicinity of the appendix most consistent with large appendiceal abscess.  She underwent CT guided drainage of this abscess on 09/22/2019 at [facility]. She was transferred back to [facility] following the procedure.  She was discharged home with  the drain on 09/24/2019.  Drain fluid cultures grew multiple organisms.  Drain output has been minimal per patient.  Preliminary findings from follow-up CT today shows small residual abscess in region of drain.  Drain injection today reveals communication of abscess cavity with appendix and bowel.  Findings were discussed with Dr. Laurence Ferrari and decision made to keep current drain in place.  Patient is scheduled to follow-up with surgeon on 4/29.  She will be scheduled for follow-up drain injection in 2 weeks.  She was told to discontinue drain irrigation but to maintain records regarding output and also change overlying gauze dressing every 2 to 3 days.  She was told to call our service with any additional questions or concerns.   Signed: D. Rowe Robert, PA-C 10/05/2019, 12:44  PM   Please refer to Dr. Katrinka Blazing attestation of this note for management and plan.      Patient ID: Danielle Morris, female   DOB: 11-16-1955, 64 y.o.   MRN: [id number]

## 2019-10-07 ENCOUNTER — Encounter: Payer: Self-pay | Admitting: General Surgery

## 2019-10-07 ENCOUNTER — Other Ambulatory Visit: Payer: Self-pay

## 2019-10-07 ENCOUNTER — Ambulatory Visit (INDEPENDENT_AMBULATORY_CARE_PROVIDER_SITE_OTHER): Payer: BC Managed Care – PPO | Admitting: General Surgery

## 2019-10-07 VITALS — BP 117/78 | HR 69 | Temp 97.3°F | Resp 12 | Ht 67.0 in | Wt 128.0 lb

## 2019-10-07 DIAGNOSIS — K3533 Acute appendicitis with perforation and localized peritonitis, with abscess: Secondary | ICD-10-CM | POA: Diagnosis not present

## 2019-10-07 MED ORDER — CYCLOBENZAPRINE HCL 5 MG PO TABS: 5.0000 mg | ORAL_TABLET | Freq: Three times a day (TID) | ORAL | 0 refills | Status: DC | PRN

## 2019-10-07 NOTE — Progress Notes (Signed)
[facility]   HPI:  64 y.o. Female presents to clinic for follow-up evaluation of her IR drain for perforated appendicitis. She says she is having gluteal pain but is otherwise feeling ok. Her abdominal pain has improved. She has 1 day left of antibiotics. She went for IR drain check and has a fistula to the cecum, and they told her to stop flushing the drain. She has no fevers and is not using pain medication.   Review of Systems:  Tolerating diet BMs regular Not smoking any more  All other review of systems: otherwise negative   Vital Signs:  BP 117/78   Pulse 69   Temp (!) 97.3 F (36.3 C) (Oral)   Resp 12   Ht 5\' 7"  (1.702 m)   Wt 128 lb (58.1 kg)   SpO2 98%   BMI 20.05 kg/m    Physical Exam:  Physical Exam Constitutional:      Appearance: She is normal weight.  Cardiovascular:     Rate and Rhythm: Normal rate.  Pulmonary:     Effort: Pulmonary effort is normal.  Abdominal:     General: There is no distension.     Tenderness: There is no abdominal tenderness.  Genitourinary:    Comments: Gluteal drain in place in right glute, minor brownish drainage in the tubing, tender around insertion site Neurological:     Mental Status: She is alert.     Imaging:  CT IR/ drain study- fistula to the drain    Assessment:  63 y.o. yo Female with perforated appendicitis s/p IR drainage. Now with fistula, hopefully this will improve. Discussed that this is all expected and can take time. Discussed that we would hope that she would get colonoscopy prior to surgery due to her extensive colorectal polyps in 2015 and need for repeat in 2018 that was not done.   Plan:  Continue drain care per the radiology instructions. Take Flexeril if needed for muscle spasm.   Future Appointments  Date Time Provider [place]  10/19/2019  1:00 PM [facility] DG 1 ([place]) [facility]  10/19/2019  1:00 PM [facility] IR [facility]  10/21/2019  1:30 PM  Virl Cagey, MD [facility] None    All of the above recommendations were discussed with the patient and patient's family, and all of patient's and family's questions were answered to their expressed satisfaction.   Curlene Labrum, MD [facility] [address] [place], [place] [address] [phone] (office)

## 2019-10-07 NOTE — Patient Instructions (Signed)
Continue drain care per the radiology instructions. Take Flexeril if needed for muscle spasm.

## 2019-10-19 ENCOUNTER — Ambulatory Visit
Admission: RE | Admit: 2019-10-19 | Discharge: 2019-10-19 | Disposition: A | Discharge status: Home / Self Care | Payer: BC Managed Care – PPO | Source: Ambulatory Visit | Attending: General Surgery | Admitting: General Surgery

## 2019-10-19 ENCOUNTER — Encounter: Payer: Self-pay | Admitting: Radiology

## 2019-10-19 DIAGNOSIS — K3533 Acute appendicitis with perforation and localized peritonitis, with abscess: Secondary | ICD-10-CM

## 2019-10-19 HISTORY — PX: IR RADIOLOGIST EVAL & MGMT: IMG5224

## 2019-10-19 NOTE — Progress Notes (Signed)
Referring Physician(s): Bridges,Lindsay C  Chief Complaint: The patient is seen in follow up today s/p CT-guided drainage of right lower quadrant abdominal/appendiceal abscess on 09/22/2019  History of present illness: Danielle Morris is a 64 year old female with history of abdominal pain requiring admission to [facility] on 09/20/2019.  Subsequent imaging revealed large rim-enhancing fluid collection in the vicinity of the appendix most consistent with large appendiceal abscess.  She underwent CT guided drainage of this abscess on 09/22/2019 at [facility]. She was transferred back to [facility] following the procedure.  She was discharged home with  the drain on 09/24/2019.  Drain fluid cultures grew multiple organisms.  Drain injection performed on 10/05/19 revealed a fistulous communication between the abscess cavity and the distal ileum or persistently enlarged appendix.  She presents again today for follow-up drain injection.  She currently denies fever, headache, respiratory problems, nausea, vomiting or bleeding.  She does have some discomfort at right transgluteal drain insertion site as well as some occasional painful defecation.  She is not on antibiotic therapy.  Patient states drain output has been variable with minimal 1 day followed by output between 30 to 40 cc the next day.  She is not flushing the drain.   Past Medical History:  Diagnosis Date  . GAD (generalized anxiety disorder)   . HLD (hyperlipidemia)   . Hypertension   . Panic attack   . Tobacco abuse     Past Surgical History:  Procedure Laterality Date  . COLONOSCOPY WITH PROPOFOL N/A 11/02/2013   Procedure: COLONOSCOPY WITH PROPOFOL;  Surgeon: Danie Binder, MD;  Location: [facility];  Service: Endoscopy;  Laterality: N/A;  in cecum @ 1002; cecal withdrawal time- minutes  . HEMORRHOID BANDING N/A 11/02/2013   Procedure: HEMORRHOID BANDING;  Surgeon: Danie Binder, MD;  Location: [facility];  Service:  Endoscopy;  Laterality: N/A;  . HEMORRHOID SURGERY N/A 11/05/2013   Procedure: EXTENSIVE HEMORRHOIDECTOMY ;  Surgeon: Jamesetta So, MD;  Location: [facility];  Service: General;  Laterality: N/A;  . IR RADIOLOGIST EVAL & MGMT  10/05/2019  . POLYPECTOMY N/A 11/02/2013   Procedure: POLYPECTOMY;  Surgeon: Danie Binder, MD;  Location: [facility];  Service: Endoscopy;  Laterality: N/A;  sigmoid colon, hepatic flexure, hyperplastic rectal, and rectal polyps  . TONSILLECTOMY     age  41    Allergies: Amlodipine, Tramadol, Hydrocodone, Lisinopril, and Chantix [varenicline]  Medications: Prior to Admission medications   Medication Sig Start Date End Date Taking? Authorizing Provider  atorvastatin (LIPITOR) 40 MG tablet Take 0.5 tablets (20 mg total) by mouth daily. 09/24/19   Barton Dubois, MD  cyclobenzaprine (FLEXERIL) 5 MG tablet Take 1 tablet (5 mg total) by mouth 3 (three) times daily as needed for muscle spasms. 10/07/19   Virl Cagey, MD  docusate sodium (STOOL SOFTENER) 100 MG capsule Take 100 mg by mouth daily.    [provider]  doxylamine, Sleep, (UNISOM) 25 MG tablet Take 25 mg by mouth at bedtime as needed for sleep.    [provider]  losartan (COZAAR) 100 MG tablet TAKE 1 TABLET(100 MG) BY MOUTH DAILY 09/27/19   Susy Frizzle, MD  ondansetron (ZOFRAN ODT) 8 MG disintegrating tablet Take 1 tablet (8 mg total) by mouth every 8 (eight) hours as needed for nausea or vomiting. 09/24/19   Barton Dubois, MD  sertraline (ZOLOFT) 50 MG tablet TAKE 1 TABLET(50 MG) BY MOUTH DAILY 09/27/19   Susy Frizzle,  MD     Family History  Problem Relation Age of Onset  . Heart disease Father 92       MI--H/O Rheumatic Fever as a child  . Hypertension Brother   . Hypertension Brother   . Diabetes Other     Social History   Socioeconomic History  . Marital status: Married    Spouse name: Not on file  . Number of children: Not on file  . Years of education: Not on file    . Highest education level: Not on file  Occupational History  . Not on file  Tobacco Use  . Smoking status: Current Some Day Smoker    Packs/day: 0.50    Years: 10.00    Pack years: 5.00    Types: Cigarettes  . Smokeless tobacco: Never Used  . Tobacco comment: uses E-cigs  Substance and Sexual Activity  . Alcohol use: No  . Drug use: No  . Sexual activity: Not on file  Other Topics Concern  . Not on file  Social History Narrative   Entered 03/2014:   Married.    2 Daughters and son in law, and 1 grandchild live with them now.    Keeps another grandchild frequently also.   Does not work outside of house.   Social Determinants of Health   Financial Resource Strain:   . Difficulty of Paying Living Expenses:   Food Insecurity:   . Worried About Charity fundraiser in the Last Year:   . Arboriculturist in the Last Year:   Transportation Needs:   . Film/video editor (Medical):   Marland Kitchen Lack of Transportation (Non-Medical):   Physical Activity:   . Days of Exercise per Week:   . Minutes of Exercise per Session:   Stress:   . Feeling of Stress :   Social Connections:   . Frequency of Communication with Friends and Family:   . Frequency of Social Gatherings with Friends and Family:   . Attends Religious Services:   . Active Member of Clubs or Organizations:   . Attends Archivist Meetings:   Marland Kitchen Marital Status:      Vital Signs: Vitals:   10/19/19 1300  BP: (!) 149/90  Pulse: 60  Temp: 98.1 F (36.7 C)  SpO2: 100%      Physical Exam awake, alert.  Right transgluteal drain intact, insertion site okay, mild tenderness to palpation, minimal amount of brown fluid in gravity bag  Imaging: No results found.  Labs:  CBC: Recent Labs    07/20/19 1610 09/20/19 1558 09/21/19 0421 09/22/19 0417  WBC 5.8 11.4* 6.8 4.5  HGB 14.4 12.4 10.5* 10.1*  HCT 42.3 36.8 32.6* 32.1*  PLT 281 436* 366 382    COAGS: Recent Labs    09/20/19 1558  INR 1.2     BMP: Recent Labs    07/20/19 1610 09/20/19 1558 09/21/19 0421 09/22/19 0417  NA 134* 128* 134* 136  K 4.5 3.6 4.0 4.0  CL 96* 94* 101 104  CO2 28 22 23 26   GLUCOSE 96 100* 89 77  BUN 9 10 10  7*  CALCIUM 9.5 9.0 8.7* 8.4*  CREATININE 0.92 0.74 0.73 0.80  GFRNONAA 67 >60 >60 >60  GFRAA 78 >60 >60 >60    LIVER FUNCTION TESTS: Recent Labs    03/25/19 1444 07/20/19 1610 09/20/19 1558 09/21/19 0421  BILITOT 0.5 0.6 0.7 0.7  AST 18 15 24 16   ALT 10 10 29  22  ALKPHOS  --   --  83 65  PROT 7.6 7.5 8.4* 6.9  ALBUMIN  --   --  3.3* 2.7*    Assessment: 64 year old female with history of abdominal pain requiring admission to [facility] on 09/20/2019.  Subsequent imaging revealed large rim-enhancing fluid collection in the vicinity of the appendix most consistent with large appendiceal abscess.  She underwent CT guided drainage of this abscess on 09/22/2019 at [facility]. She was transferred back to [facility] following the procedure.  She was discharged home with  the drain on 09/24/2019. Drain fluid cultures grew multiple organisms.    Previous drain injection on 10/05/2019 revealed significant fistulous communication between the drain catheter and either the dilated appendix versus the terminal ileum with subsequent filling of the cecum.  Follow-up drain injection today reveals persistent fistula.  Case was reviewed with Dr. Laurence Ferrari.  Patient to maintain drain at this time.  She is scheduled to follow-up with her surgeon Dr. Constance Haw later this week.  Further plans will be based on that evaluation.  If surgery not performed patient will likely need drain exchange in late May or early June.  Patient updated on above findings.  She was instructed to continue monitoring drain output, and change dressing every 1 to 2 days. She was told not to flush the drain.  Signed: D. Rowe Robert, PA-C 10/19/2019, 1:23 PM   Please refer to Dr. Katrinka Blazing attestation of  this note for management and plan.      Patient ID: Danielle Morris, female   DOB: 10-24-1955, 64 y.o.   MRN: [id number]

## 2019-10-21 ENCOUNTER — Encounter: Payer: Self-pay | Admitting: General Surgery

## 2019-10-21 ENCOUNTER — Other Ambulatory Visit: Payer: Self-pay

## 2019-10-21 ENCOUNTER — Ambulatory Visit (INDEPENDENT_AMBULATORY_CARE_PROVIDER_SITE_OTHER): Payer: BC Managed Care – PPO | Admitting: General Surgery

## 2019-10-21 VITALS — BP 128/82 | HR 63 | Temp 97.5°F | Ht 67.0 in | Wt 131.4 lb

## 2019-10-21 DIAGNOSIS — R1030 Lower abdominal pain, unspecified: Secondary | ICD-10-CM | POA: Diagnosis not present

## 2019-10-21 DIAGNOSIS — K3533 Acute appendicitis with perforation and localized peritonitis, with abscess: Secondary | ICD-10-CM

## 2019-10-21 MED ORDER — OXYCODONE HCL 5 MG PO CAPS: 5.0000 mg | ORAL_CAPSULE | ORAL | 0 refills | Status: DC | PRN

## 2019-10-21 NOTE — Patient Instructions (Signed)
Continue drain care. Call with concerns, fevers, chills signs of infection.

## 2019-10-21 NOTE — Progress Notes (Signed)
[facility]   HPI:  64 y.o. Female presents to clinic for follow-up evaluation of her IR drain after what we suspect is perforated appendicitis. She is having more lower abdominal pain and is needing to use the pain medication. She says the drain goes from putting out nothing to putting out 30cc of thick brown fluid. She has a continued fistula to the appendix versus the terminal ileum on the IR drain study.   Review of Systems:  No fevers or chills All other review of systems: otherwise negative   Vital Signs:  BP 128/82   Pulse 63   Temp (!) 97.5 F (36.4 C)   Ht 5\' 7"  (1.702 m)   Wt 131 lb 6.4 oz (59.6 kg)   SpO2 98%   BMI 20.58 kg/m    Physical Exam:  Physical Exam Vitals reviewed.  HENT:     Head: Normocephalic.     Mouth/Throat:     Pharynx: Oropharyngeal exudate present.  Cardiovascular:     Rate and Rhythm: Normal rate.  Pulmonary:     Effort: Pulmonary effort is normal.  Abdominal:     General: There is no distension.     Palpations: Abdomen is soft.     Tenderness: There is abdominal tenderness in the right lower quadrant, suprapubic area and left lower quadrant.  Musculoskeletal:        General: No swelling.  Neurological:     General: No focal deficit present.     Mental Status: She is alert and oriented to person, place, and time.     Imaging:  IR drain study reviewed- contrast into the cecum from ileum versus appendix    Assessment:  64 y.o. yo Female with a history of what we think is perforated appendicitis but potential for cancer versus IBD given the thickening of the ileum on original CT and also history of multiple colorectal polyps with no follow up.   Plan:  - IR drain care  - Follow up with IR  - Roxicodone 5 mg q4 PRN for pain (#20) - May have to proceed with surgery after 8 weeks and may need right hemicolectomy versus lap appendectomy given the drain and the fistula. Discussed that we still would want to get her a  colonoscopy but that this may have to be after the surgery - Repeat CT today for worsening pain. Last CT has been about 4 weeks.    Future Appointments  Date Time Provider [place]  11/25/2019  1:30 PM Virl Cagey, MD [facility] None     All of the above recommendations were discussed with the patient and patient's family, and all of patient's and family's questions were answered to their expressed satisfaction.  Curlene Labrum, MD [facility] [address] [place], [place] [address] [phone] (office)

## 2019-10-26 ENCOUNTER — Telehealth: Payer: Self-pay | Admitting: General Surgery

## 2019-10-26 ENCOUNTER — Other Ambulatory Visit: Payer: Self-pay

## 2019-10-26 ENCOUNTER — Telehealth: Payer: Self-pay

## 2019-10-26 DIAGNOSIS — K3533 Acute appendicitis with perforation and localized peritonitis, with abscess: Secondary | ICD-10-CM

## 2019-10-26 DIAGNOSIS — R1030 Lower abdominal pain, unspecified: Secondary | ICD-10-CM

## 2019-10-26 MED ORDER — OXYCODONE HCL 5 MG PO CAPS: 5.0000 mg | ORAL_CAPSULE | ORAL | 0 refills | Status: DC | PRN

## 2019-10-26 NOTE — Telephone Encounter (Signed)
Spoke with the patient's husband. The patient is scheduled for a Ct abdomen and pelvis with contrast at [facility] for 10/29/19 at 12:00 pm. She is to arrive there by 11:45 am and have nothing to eat or drink for 4 hours prior. She will need to pick up a prep kit a few days prior. Dr Constance Haw will put in for a refill of her pain medication. She was instructed to augment this with Ibuprofen and Tylenol alternating every 4-6 hours.

## 2019-10-26 NOTE — Telephone Encounter (Signed)
[facility]  Patient with worsening lower abdominal pain and needing the roxicodone. Drain remains in place. Will refill again for now but will have to start using more ibuprofen and tylenol as we do not want her to become sensitized prior to any surgery.  Curlene Labrum, MD [facility] [address] [place], [place] [address] [phone] (office)

## 2019-10-29 ENCOUNTER — Ambulatory Visit (HOSPITAL_COMMUNITY)
Admission: RE | Admit: 2019-10-29 | Discharge: 2019-10-29 | Disposition: A | Discharge status: Home / Self Care | Payer: BC Managed Care – PPO | Source: Ambulatory Visit | Attending: General Surgery | Admitting: General Surgery

## 2019-10-29 ENCOUNTER — Other Ambulatory Visit: Payer: Self-pay

## 2019-10-29 DIAGNOSIS — R1030 Lower abdominal pain, unspecified: Secondary | ICD-10-CM | POA: Diagnosis not present

## 2019-10-29 DIAGNOSIS — K8020 Calculus of gallbladder without cholecystitis without obstruction: Secondary | ICD-10-CM | POA: Diagnosis not present

## 2019-10-29 LAB — POCT I-STAT CREATININE: Creatinine, Ser: 0.8 mg/dL (ref 0.44–1.00)

## 2019-10-29 MED ORDER — IOHEXOL 300 MG/ML  SOLN
100.0000 mL | Freq: Once | INTRAMUSCULAR | Status: AC | PRN
  Administered 2019-10-29: 100 mL via INTRAVENOUS

## 2019-11-01 ENCOUNTER — Other Ambulatory Visit: Payer: Self-pay | Admitting: General Surgery

## 2019-11-01 ENCOUNTER — Telehealth: Payer: Self-pay | Admitting: General Surgery

## 2019-11-01 DIAGNOSIS — R1030 Lower abdominal pain, unspecified: Secondary | ICD-10-CM

## 2019-11-01 DIAGNOSIS — K3533 Acute appendicitis with perforation and localized peritonitis, with abscess: Secondary | ICD-10-CM

## 2019-11-01 NOTE — Telephone Encounter (Signed)
[facility]  CT done for worsening lower abdominal pain. Everything is looking better and no signs of any inflammation or other etiology. Pain likely from the catheter.   Plan for IR drain check in June and follow up with me after. If the fistula has not resolved, will plan for OR at our next appt. Will likely mean a right hemicolectomy given the prediction the fistula may be in the ileum.  Left a message for the patient. I see her back June 17. I sent IR a message so they get her scheduled for early June.    Curlene Labrum, MD [facility] [address] [place], [place] [address] [phone] (office)

## 2019-11-04 ENCOUNTER — Other Ambulatory Visit: Payer: Self-pay | Admitting: Family Medicine

## 2019-11-04 DIAGNOSIS — K3533 Acute appendicitis with perforation and localized peritonitis, with abscess: Secondary | ICD-10-CM

## 2019-11-04 NOTE — Telephone Encounter (Signed)
Patients' husband called and states that she would like to have a refill of her Flexeril that it seems to be helping her.   Last RF: 10/07/2019  [facility] [phone] - [place], [place] [id number]

## 2019-11-04 NOTE — Telephone Encounter (Signed)
Pt called and would also like her pain med refilled.  Oxycodone LRF: 10/26/2019

## 2019-11-05 MED ORDER — OXYCODONE HCL 5 MG PO CAPS: 5.0000 mg | ORAL_CAPSULE | ORAL | 0 refills | Status: DC | PRN

## 2019-11-05 MED ORDER — CYCLOBENZAPRINE HCL 5 MG PO TABS: 5.0000 mg | ORAL_TABLET | Freq: Three times a day (TID) | ORAL | 1 refills | Status: DC | PRN

## 2019-11-05 NOTE — Telephone Encounter (Signed)
[facility]  Patient requesting refills. Sent in flexeril and will do another refill of Roxicodone. Has as drain in place so has reason for pain.  Needs to use tylenol and ibuprofen also.   Curlene Labrum, MD [facility] [address] [place], [place] [address] [phone] (office)

## 2019-11-10 ENCOUNTER — Ambulatory Visit
Admission: RE | Admit: 2019-11-10 | Discharge: 2019-11-10 | Disposition: A | Discharge status: Home / Self Care | Payer: BC Managed Care – PPO | Source: Ambulatory Visit | Attending: General Surgery | Admitting: General Surgery

## 2019-11-10 DIAGNOSIS — K3533 Acute appendicitis with perforation and localized peritonitis, with abscess: Secondary | ICD-10-CM | POA: Diagnosis not present

## 2019-11-10 DIAGNOSIS — K8020 Calculus of gallbladder without cholecystitis without obstruction: Secondary | ICD-10-CM | POA: Diagnosis not present

## 2019-11-10 HISTORY — PX: IR RADIOLOGIST EVAL & MGMT: IMG5224

## 2019-11-10 MED ORDER — IOPAMIDOL (ISOVUE-300) INJECTION 61%
100.0000 mL | Freq: Once | INTRAVENOUS | Status: AC | PRN
  Administered 2019-11-10: 100 mL via INTRAVENOUS

## 2019-11-10 NOTE — Progress Notes (Signed)
Referring Physician(s): Bridges,Lindsay C  Chief Complaint: The patient is seen in follow up today s/p CT-guided drainage of right lower quadrant abdominal/appendiceal abscess on 09/22/2019.   History of present illness:  Danielle Morris is a 64 year old female with history of abdominal pain requiring admission [facility] on 09/20/2019. Subsequent imaging revealed large rim-enhancing fluid collection in the vicinity of the appendix most consistent with large appendiceal abscess. She underwent CT guideddrainage of this abscess on 09/22/2019 at [facility] transferred back to [facility] following theprocedure. She was discharged home with thedrain on 09/24/2019. Drain fluid cultures grew multiple organisms.  Drain injection performed on 10/05/19 revealed a fistulous communication between the abscess cavity and the distal ileum or persistently enlarged appendix.  She presents again today for follow-up drain injection.  She currently denies fever, headache, respiratory problems, nausea, vomiting or bleeding.  She does have some discomfort at right transgluteal drain insertion site as well as some occasional painful defecation.  She is not on antibiotic therapy.  Patient states drain output has been zero since her last visit. She is not flushing the drain.  Past Medical History:  Diagnosis Date  . GAD (generalized anxiety disorder)   . HLD (hyperlipidemia)   . Hypertension   . Panic attack   . Tobacco abuse     Past Surgical History:  Procedure Laterality Date  . COLONOSCOPY WITH PROPOFOL N/A 11/02/2013   Procedure: COLONOSCOPY WITH PROPOFOL;  Surgeon: Danie Binder, MD;  Location: [facility];  Service: Endoscopy;  Laterality: N/A;  in cecum @ 1002; cecal withdrawal time- minutes  . HEMORRHOID BANDING N/A 11/02/2013   Procedure: HEMORRHOID BANDING;  Surgeon: Danie Binder, MD;  Location: [facility];  Service: Endoscopy;  Laterality: N/A;  . HEMORRHOID SURGERY N/A  11/05/2013   Procedure: EXTENSIVE HEMORRHOIDECTOMY ;  Surgeon: Jamesetta So, MD;  Location: [facility];  Service: General;  Laterality: N/A;  . IR RADIOLOGIST EVAL & MGMT  10/05/2019  . IR RADIOLOGIST EVAL & MGMT  10/19/2019  . IR RADIOLOGIST EVAL & MGMT  11/10/2019  . POLYPECTOMY N/A 11/02/2013   Procedure: POLYPECTOMY;  Surgeon: Danie Binder, MD;  Location: [facility];  Service: Endoscopy;  Laterality: N/A;  sigmoid colon, hepatic flexure, hyperplastic rectal, and rectal polyps  . TONSILLECTOMY     age  30    Allergies: Amlodipine, Tramadol, Hydrocodone, Lisinopril, and Chantix [varenicline]  Medications: Prior to Admission medications   Medication Sig Start Date End Date Taking? Authorizing Provider  atorvastatin (LIPITOR) 40 MG tablet Take 0.5 tablets (20 mg total) by mouth daily. 09/24/19   Barton Dubois, MD  cyclobenzaprine (FLEXERIL) 5 MG tablet Take 1 tablet (5 mg total) by mouth 3 (three) times daily as needed for muscle spasms. 11/05/19   Virl Cagey, MD  docusate sodium (STOOL SOFTENER) 100 MG capsule Take 100 mg by mouth daily.    [provider]  doxylamine, Sleep, (UNISOM) 25 MG tablet Take 25 mg by mouth at bedtime as needed for sleep.    [provider]  losartan (COZAAR) 100 MG tablet TAKE 1 TABLET(100 MG) BY MOUTH DAILY 09/27/19   Susy Frizzle, MD  ondansetron (ZOFRAN ODT) 8 MG disintegrating tablet Take 1 tablet (8 mg total) by mouth every 8 (eight) hours as needed for nausea or vomiting. 09/24/19   Barton Dubois, MD  oxycodone (OXY-IR) 5 MG capsule Take 1 capsule (5 mg total) by mouth every 4 (four) hours as needed for pain. 11/05/19  Virl Cagey, MD  sertraline (ZOLOFT) 50 MG tablet TAKE 1 TABLET(50 MG) BY MOUTH DAILY 09/27/19   Susy Frizzle, MD     Family History  Problem Relation Age of Onset  . Heart disease Father 25       MI--H/O Rheumatic Fever as a child  . Hypertension Brother   . Hypertension Brother   . Diabetes Other      Social History   Socioeconomic History  . Marital status: Married    Spouse name: Not on file  . Number of children: Not on file  . Years of education: Not on file  . Highest education level: Not on file  Occupational History  . Not on file  Tobacco Use  . Smoking status: Former Smoker    Packs/day: 0.50    Years: 10.00    Pack years: 5.00    Types: Cigarettes    Quit date: 09/09/2019    Years since quitting: 0.1  . Smokeless tobacco: Never Used  . Tobacco comment: uses E-cigs  Substance and Sexual Activity  . Alcohol use: No  . Drug use: No  . Sexual activity: Not on file  Other Topics Concern  . Not on file  Social History Narrative   Entered 03/2014:   Married.    2 Daughters and son in law, and 1 grandchild live with them now.    Keeps another grandchild frequently also.   Does not work outside of house.   Social Determinants of Health   Financial Resource Strain:   . Difficulty of Paying Living Expenses:   Food Insecurity:   . Worried About Charity fundraiser in the Last Year:   . Arboriculturist in the Last Year:   Transportation Needs:   . Film/video editor (Medical):   Marland Kitchen Lack of Transportation (Non-Medical):   Physical Activity:   . Days of Exercise per Week:   . Minutes of Exercise per Session:   Stress:   . Feeling of Stress :   Social Connections:   . Frequency of Communication with Friends and Family:   . Frequency of Social Gatherings with Friends and Family:   . Attends Religious Services:   . Active Member of Clubs or Organizations:   . Attends Archivist Meetings:   Marland Kitchen Marital Status:      Vital Signs: BP (!) 142/86   Pulse 73   Temp 98.6 F (37 C)   SpO2 100%   Physical Exam Pulmonary:     Effort: Pulmonary effort is normal.  Abdominal:     Tenderness: There is abdominal tenderness.     Comments: Tenderness around drain site. Drain is in the right upper gluteal site. Site is clean and dry without erythema or  drainage. There was scant brown material in the tubing and gravity bag. A new gravity bag was placed.   Skin:    General: Skin is warm and dry.  Neurological:     Mental Status: She is alert and oriented to person, place, and time.     Imaging: CT ABDOMEN PELVIS W CONTRAST  Result Date: 11/10/2019 CLINICAL DATA:  History of appendiceal abscess and drainage. EXAM: CT ABDOMEN AND PELVIS WITH CONTRAST TECHNIQUE: Multidetector CT imaging of the abdomen and pelvis was performed using the standard protocol following bolus administration of intravenous contrast. CONTRAST:  128mL ISOVUE-300 IOPAMIDOL (ISOVUE-300) INJECTION 61% COMPARISON:  10/29/2019 FINDINGS: Lower chest: Indeterminate 5 mm nodule in the posterior right lower lobe  on sequence 4, image 5. No pleural effusions. Hepatobiliary: Evidence for cholelithiasis. No evidence for gallbladder inflammation. Normal appearance of the liver without biliary dilatation. Pancreas: Unremarkable. No pancreatic ductal dilatation or surrounding inflammatory changes. Spleen: Normal in size without focal abnormality. Adrenals/Urinary Tract: Normal adrenal glands. Normal appearance of the right kidney. Mild dilatation of the right ureter without significant right hydronephrosis. Normal appearance of the urinary bladder. Exophytic cyst in left kidney upper pole that measures up to 2.2 cm. Negative for left hydronephrosis. Stomach/Bowel: No evidence for a bowel obstruction. Large amount of bowel in the right lower quadrant near the percutaneous drain. Normal appendix is not confidently identified but there are ill-defined structures in the right lower quadrant sequence 2, image 53. No residual abscess collection. There is a surgical drain in the right lower quadrant. Vascular/Lymphatic: Atherosclerotic calcifications involving the abdominal aorta without aneurysm. Iliac arteries are heavily calcified. No significant abdominal or pelvic lymphadenopathy. Reproductive: Uterus  is along the right side of the pelvis. No evidence for an adnexal mass. Other: Right transgluteal percutaneous drain is stable with the pigtail in the right lower quadrant of the abdomen. There is no significant fluid around the drain. No new abscess or fluid collections in the abdomen or pelvis. Musculoskeletal: Stable anterolisthesis at L4-L5 that appears to be secondary to facet arthropathy. IMPRESSION: 1. Right transgluteal percutaneous drain is in a stable position. No evidence for residual fluid or abscess collection in the abdomen or pelvis. 2. No acute abnormality in the abdomen or pelvis. 3. Cholelithiasis without gallbladder inflammation. 4. Indeterminate 5 mm nodule in the right lower lobe. No follow-up needed if patient is low-risk. Non-contrast chest CT can be considered in 12 months if patient is high-risk. This recommendation follows the consensus statement: Guidelines for Management of Incidental Pulmonary Nodules Detected on CT Images: From the Fleischner Society 2017; Radiology 2017; 284:228-243. 5.  Aortic Atherosclerosis (ICD10-I70.0). Electronically Signed   By: Markus Daft M.D.   On: 11/10/2019 14:22   IR Radiologist Eval & Mgmt  Result Date: 11/10/2019 Please refer to notes tab for details about interventional procedure. (Op Note)   Labs:  CBC: Recent Labs    07/20/19 1610 09/20/19 1558 09/21/19 0421 09/22/19 0417  WBC 5.8 11.4* 6.8 4.5  HGB 14.4 12.4 10.5* 10.1*  HCT 42.3 36.8 32.6* 32.1*  PLT 281 436* 366 382    COAGS: Recent Labs    09/20/19 1558  INR 1.2    BMP: Recent Labs    07/20/19 1610 09/20/19 1558 09/21/19 0421 09/22/19 0417 10/29/19 1239  NA 134* 128* 134* 136  --   K 4.5 3.6 4.0 4.0  --   CL 96* 94* 101 104  --   CO2 28 22 23 26   --   GLUCOSE 96 100* 89 77  --   BUN 9 10 10  7*  --   CALCIUM 9.5 9.0 8.7* 8.4*  --   CREATININE 0.92 0.74 0.73 0.80 0.80  GFRNONAA 67 >60 >60 >60  --   GFRAA 78 >60 >60 >60  --     LIVER FUNCTION  TESTS: Recent Labs    03/25/19 1444 07/20/19 1610 09/20/19 1558 09/21/19 0421  BILITOT 0.5 0.6 0.7 0.7  AST 18 15 24 16   ALT 10 10 29 22   ALKPHOS  --   --  83 65  PROT 7.6 7.5 8.4* 6.9  ALBUMIN  --   --  3.3* 2.7*    Assessment: Danielle Morris presented to the IR  clinic today for follow up drain injection. Imaging continues to show a prominent fistula, per Dr. Anselm Pancoast. Patient has been instructed to follow up with Dr. Constance Haw for the next steps, which may include surgery. Patient has been instructed to maintain the gravity bag, keep it in place, keep the dressings clean and dry. Patient was told she did not need to follow up with the IR clinic unless instructed to do so by Dr. Constance Haw. The patient's husband was present in the room for this instruction/information. Both the patient and the husband verbalized understanding of these instructions.    Signed: Theresa Duty, NP 11/10/2019, 2:49 PM   Please refer to Dr. Anselm Pancoast attestation of this note for management and plan.

## 2019-11-11 ENCOUNTER — Telehealth: Payer: Self-pay | Admitting: Family Medicine

## 2019-11-11 NOTE — Telephone Encounter (Signed)
Pt called and states that she has a lot of pain after a bm and would like to know if you would refill her pain medication so that she has some just in case over the weekend. She currently has 5 pills left.  Last RF: 11/05/2019 #10

## 2019-11-12 MED ORDER — OXYCODONE HCL 5 MG PO CAPS: 5.0000 mg | ORAL_CAPSULE | ORAL | 0 refills | Status: DC | PRN

## 2019-11-12 NOTE — Telephone Encounter (Signed)
PA Approved and pharmacy made aware to resubmit.

## 2019-11-12 NOTE — Telephone Encounter (Signed)
[facility]  Additional order sent in for weekend. Will see patient next week and get scheduled for surgery.  Curlene Labrum, MD [facility] [address] [place], [place] [address] [phone] (office)

## 2019-11-12 NOTE — Telephone Encounter (Signed)
Pain medication needed prior authorization - PA Submitted through [email or web address] and received the following:  Your information has been submitted to [place]. To check for an updated outcome later, reopen this PA request from your dashboard.  If [employer] has not responded to your request within 24 hours, contact [place] at [phone]. If you think there may be a problem with your PA request, use our live chat feature at the bottom right.  Pt may can pay for out of pocket if needed before a decision has been made by the end of the day.

## 2019-11-15 NOTE — Patient Instructions (Signed)
Danielle Morris  11/15/2019     @[email or web address] @   Your procedure is scheduled on   11/19/2019   Report to [facility] at  0930  A.M.  Call this number if you have problems the morning of surgery:  [phone]   Remember:  Follow the prep instructions given to you by Dr Constance Haw.                        Take these medicines the morning of surgery with A SIP OF WATER flexeril(if needed), zofran(if needed), Oxy IR(if needed).    Do not wear jewelry, make-up or nail polish.  Do not wear lotions, powders, or perfumes. Please wear deodorant and brush your teeth.  Do not shave 48 hours prior to surgery.  Men may shave face and neck.  Do not bring valuables to the hospital.  [facility] is not responsible for any belongings or valuables.  Contacts, dentures or bridgework may not be worn into surgery.  Leave your suitcase in the car.  After surgery it may be brought to your room.  For patients admitted to the hospital, discharge time will be determined by your treatment team.  Patients discharged the day of surgery will not be allowed to drive home.   Name and phone number of your driver:   family Special instructions:  DO NOT smoke the morning of your procedure.  Please read over the following fact sheets that you were given. Pain Booklet, Coughing and Deep Breathing, Blood Transfusion Information, Lab Information, MRSA Information, Surgical Site Infection Prevention, Anesthesia Post-op Instructions and Care and Recovery After Surgery       Open Colectomy, Care After This sheet gives you information about how to care for yourself after your procedure. Your health care provider may also give you more specific instructions. If you have problems or questions, contact your health care provider. What can I expect after the procedure? After the procedure, it is common to have:  Pain in your abdomen, especially along your incision.  Tiredness. Your energy level will return to  normal over the next several weeks.  Constipation.  Nausea.  Difficulty urinating. Follow these instructions at home: Activity  You may be able to return to most of your normal activities within 1-2 weeks, such as working, walking up stairs, and sexual activity.  Avoid activities that require a lot of energy for 4-6 weeks after surgery, such as running, climbing, and lifting heavy objects. Ask your health care provider what activities are safe for you.  Take rest breaks during the day as needed.  Do not drive for 1-2 weeks or until your health care provider says that it is safe.  Do not drive or use heavy machinery while taking prescription pain medicines.  Do not lift anything that is heavier than 10 lb (4.3 kg) until your health care provider says that it is safe. Incision care   Follow instructions from your health care provider about how to take care of your incision. Make sure you: ? Wash your hands with soap and water before you change your bandage (dressing). If soap and water are not available, use hand sanitizer. ? Change your dressing as told by your health care provider. ? Leave stitches (sutures) or staples in place. These skin closures may need to stay in place for 2 weeks or longer.  Avoid wearing tight clothing around your incision.  Protect your incision area from  the sun.  Check your incision area every day for signs of infection. Check for: ? More redness, swelling, or pain. ? More fluid or blood. ? Warmth. ? Pus or a bad smell. General instructions  Do not take baths, swim, or use a hot tub until your health care provider approves. Ask your health care provider when you may shower.  Take over-the-counter and prescription medicines, including stool softeners, only as told by your health care provider.  Eat a low-fat and low-fiber diet for the first 4 weeks after surgery.  Keep all follow-up visits as told by your health care provider. This is  important. Contact a health care provider if:  You have more redness, swelling, or pain around your incision.  You have more fluid or blood coming from your incision.  Your incision feels warm to the touch.  You have pus or a bad smell coming from your incision.  You have a fever or chills.  You do not have a bowel movement 2-3 days after surgery.  You cannot eat or drink for 24 hours or more.  You have persistent nausea and vomiting.  You have abdominal pain that gets worse and does not get better with medicine. Get help right away if:  You have chest pain.  You have shortness of breath.  You have pain or swelling in your legs.  Your incision breaks open after your sutures or staples have been removed.  You have bleeding from the rectum. This information is not intended to replace advice given to you by your health care provider. Make sure you discuss any questions you have with your health care provider. Document Revised: 05/09/2017 Document Reviewed: 02/26/2016 Elsevier Patient Education  2020 [place] Anesthesia, Adult, Care After This sheet gives you information about how to care for yourself after your procedure. Your health care provider may also give you more specific instructions. If you have problems or questions, contact your health care provider. What can I expect after the procedure? After the procedure, the following side effects are common:  Pain or discomfort at the IV site.  Nausea.  Vomiting.  Sore throat.  Trouble concentrating.  Feeling cold or chills.  Weak or tired.  Sleepiness and fatigue.  Soreness and body aches. These side effects can affect parts of the body that were not involved in surgery. Follow these instructions at home:  For at least 24 hours after the procedure:  Have a responsible adult stay with you. It is important to have someone help care for you until you are awake and alert.  Rest as needed.  Do  not: ? Participate in activities in which you could fall or become injured. ? Drive. ? Use heavy machinery. ? Drink alcohol. ? Take sleeping pills or medicines that cause drowsiness. ? Make important decisions or sign legal documents. ? Take care of children on your own. Eating and drinking  Follow any instructions from your health care provider about eating or drinking restrictions.  When you feel hungry, start by eating small amounts of foods that are soft and easy to digest (bland), such as toast. Gradually return to your regular diet.  Drink enough fluid to keep your urine pale yellow.  If you vomit, rehydrate by drinking water, juice, or clear broth. General instructions  If you have sleep apnea, surgery and certain medicines can increase your risk for breathing problems. Follow instructions from your health care provider about wearing your sleep device: ? Anytime you are  sleeping, including during daytime naps. ? While taking prescription pain medicines, sleeping medicines, or medicines that make you drowsy.  Return to your normal activities as told by your health care provider. Ask your health care provider what activities are safe for you.  Take over-the-counter and prescription medicines only as told by your health care provider.  If you smoke, do not smoke without supervision.  Keep all follow-up visits as told by your health care provider. This is important. Contact a health care provider if:  You have nausea or vomiting that does not get better with medicine.  You cannot eat or drink without vomiting.  You have pain that does not get better with medicine.  You are unable to pass urine.  You develop a skin rash.  You have a fever.  You have redness around your IV site that gets worse. Get help right away if:  You have difficulty breathing.  You have chest pain.  You have blood in your urine or stool, or you vomit blood. Summary  After the procedure, it  is common to have a sore throat or nausea. It is also common to feel tired.  Have a responsible adult stay with you for the first 24 hours after general anesthesia. It is important to have someone help care for you until you are awake and alert.  When you feel hungry, start by eating small amounts of foods that are soft and easy to digest (bland), such as toast. Gradually return to your regular diet.  Drink enough fluid to keep your urine pale yellow.  Return to your normal activities as told by your health care provider. Ask your health care provider what activities are safe for you. This information is not intended to replace advice given to you by your health care provider. Make sure you discuss any questions you have with your health care provider. Document Revised: 05/30/2017 Document Reviewed: 01/10/2017 Elsevier Patient Education  [place]. How to Use Chlorhexidine for Bathing Chlorhexidine gluconate (CHG) is a germ-killing (antiseptic) solution that is used to clean the skin. It can get rid of the bacteria that normally live on the skin and can keep them away for about 24 hours. To clean your skin with CHG, you may be given:  A CHG solution to use in the shower or as part of a sponge bath.  A prepackaged cloth that contains CHG. Cleaning your skin with CHG may help lower the risk for infection:  While you are staying in the intensive care unit of the hospital.  If you have a vascular access, such as a central line, to provide short-term or long-term access to your veins.  If you have a catheter to drain urine from your bladder.  If you are on a ventilator. A ventilator is a machine that helps you breathe by moving air in and out of your lungs.  After surgery. What are the risks? Risks of using CHG include:  A skin reaction.  Hearing loss, if CHG gets in your ears.  Eye injury, if CHG gets in your eyes and is not rinsed out.  The CHG product catching fire. Make  sure that you avoid smoking and flames after applying CHG to your skin. Do not use CHG:  If you have a chlorhexidine allergy or have previously reacted to chlorhexidine.  On babies younger than 57 months of age. How to use CHG solution  Use CHG only as told by your health care provider, and follow the  instructions on the label.  Use the full amount of CHG as directed. Usually, this is one bottle. During a shower Follow these steps when using CHG solution during a shower (unless your health care provider gives you different instructions): 1. Start the shower. 2. Use your normal soap and shampoo to wash your face and hair. 3. Turn off the shower or move out of the shower stream. 4. Pour the CHG onto a clean washcloth. Do not use any type of brush or rough-edged sponge. 5. Starting at your neck, lather your body down to your toes. Make sure you follow these instructions: ? If you will be having surgery, pay special attention to the part of your body where you will be having surgery. Scrub this area for at least 1 minute. ? Do not use CHG on your head or face. If the solution gets into your ears or eyes, rinse them well with water. ? Avoid your genital area. ? Avoid any areas of skin that have broken skin, cuts, or scrapes. ? Scrub your back and under your arms. Make sure to wash skin folds. 6. Let the lather sit on your skin for 1-2 minutes or as long as told by your health care provider. 7. Thoroughly rinse your entire body in the shower. Make sure that all body creases and crevices are rinsed well. 8. Dry off with a clean towel. Do not put any substances on your body afterward--such as powder, lotion, or perfume--unless you are told to do so by your health care provider. Only use lotions that are recommended by the manufacturer. 9. Put on clean clothes or pajamas. 10. If it is the night before your surgery, sleep in clean sheets.  During a sponge bath Follow these steps when using CHG  solution during a sponge bath (unless your health care provider gives you different instructions): 1. Use your normal soap and shampoo to wash your face and hair. 2. Pour the CHG onto a clean washcloth. 3. Starting at your neck, lather your body down to your toes. Make sure you follow these instructions: ? If you will be having surgery, pay special attention to the part of your body where you will be having surgery. Scrub this area for at least 1 minute. ? Do not use CHG on your head or face. If the solution gets into your ears or eyes, rinse them well with water. ? Avoid your genital area. ? Avoid any areas of skin that have broken skin, cuts, or scrapes. ? Scrub your back and under your arms. Make sure to wash skin folds. 4. Let the lather sit on your skin for 1-2 minutes or as long as told by your health care provider. 5. Using a different clean, wet washcloth, thoroughly rinse your entire body. Make sure that all body creases and crevices are rinsed well. 6. Dry off with a clean towel. Do not put any substances on your body afterward--such as powder, lotion, or perfume--unless you are told to do so by your health care provider. Only use lotions that are recommended by the manufacturer. 7. Put on clean clothes or pajamas. 8. If it is the night before your surgery, sleep in clean sheets. How to use CHG prepackaged cloths  Only use CHG cloths as told by your health care provider, and follow the instructions on the label.  Use the CHG cloth on clean, dry skin.  Do not use the CHG cloth on your head or face unless your health care provider  tells you to.  When washing with the CHG cloth: ? Avoid your genital area. ? Avoid any areas of skin that have broken skin, cuts, or scrapes. Before surgery Follow these steps when using a CHG cloth to clean before surgery (unless your health care provider gives you different instructions): 1. Using the CHG cloth, vigorously scrub the part of your body  where you will be having surgery. Scrub using a back-and-forth motion for 3 minutes. The area on your body should be completely wet with CHG when you are done scrubbing. 2. Do not rinse. Discard the cloth and let the area air-dry. Do not put any substances on the area afterward, such as powder, lotion, or perfume. 3. Put on clean clothes or pajamas. 4. If it is the night before your surgery, sleep in clean sheets.  For general bathing Follow these steps when using CHG cloths for general bathing (unless your health care provider gives you different instructions). 1. Use a separate CHG cloth for each area of your body. Make sure you wash between any folds of skin and between your fingers and toes. Wash your body in the following order, switching to a new cloth after each step: ? The front of your neck, shoulders, and chest. ? Both of your arms, under your arms, and your hands. ? Your stomach and groin area, avoiding the genitals. ? Your right leg and foot. ? Your left leg and foot. ? The back of your neck, your back, and your buttocks. 2. Do not rinse. Discard the cloth and let the area air-dry. Do not put any substances on your body afterward--such as powder, lotion, or perfume--unless you are told to do so by your health care provider. Only use lotions that are recommended by the manufacturer. 3. Put on clean clothes or pajamas. Contact a health care provider if:  Your skin gets irritated after scrubbing.  You have questions about using your solution or cloth. Get help right away if:  Your eyes become very red or swollen.  Your eyes itch badly.  Your skin itches badly and is red or swollen.  Your hearing changes.  You have trouble seeing.  You have swelling or tingling in your mouth or throat.  You have trouble breathing.  You swallow any chlorhexidine. Summary  Chlorhexidine gluconate (CHG) is a germ-killing (antiseptic) solution that is used to clean the skin. Cleaning your  skin with CHG may help to lower your risk for infection.  You may be given CHG to use for bathing. It may be in a bottle or in a prepackaged cloth to use on your skin. Carefully follow your health care provider's instructions and the instructions on the product label.  Do not use CHG if you have a chlorhexidine allergy.  Contact your health care provider if your skin gets irritated after scrubbing. This information is not intended to replace advice given to you by your health care provider. Make sure you discuss any questions you have with your health care provider. Document Revised: 08/13/2018 Document Reviewed: 04/24/2017 Elsevier Patient Education  [place].

## 2019-11-16 ENCOUNTER — Ambulatory Visit (INDEPENDENT_AMBULATORY_CARE_PROVIDER_SITE_OTHER): Payer: BC Managed Care – PPO | Admitting: General Surgery

## 2019-11-16 ENCOUNTER — Encounter: Payer: Self-pay | Admitting: General Surgery

## 2019-11-16 ENCOUNTER — Encounter (HOSPITAL_COMMUNITY): Payer: Self-pay

## 2019-11-16 ENCOUNTER — Other Ambulatory Visit: Payer: Self-pay

## 2019-11-16 ENCOUNTER — Other Ambulatory Visit (HOSPITAL_COMMUNITY)
Admission: RE | Admit: 2019-11-16 | Discharge: 2019-11-16 | Disposition: A | Discharge status: Home / Self Care | Payer: BC Managed Care – PPO | Source: Ambulatory Visit | Attending: General Surgery | Admitting: General Surgery

## 2019-11-16 ENCOUNTER — Encounter (HOSPITAL_COMMUNITY)
Admission: RE | Admit: 2019-11-16 | Discharge: 2019-11-16 | Disposition: A | Discharge status: Home / Self Care | Payer: BC Managed Care – PPO | Source: Ambulatory Visit | Attending: General Surgery | Admitting: General Surgery

## 2019-11-16 VITALS — BP 118/81 | HR 80 | Temp 98.0°F | Resp 14 | Ht 67.0 in | Wt 133.0 lb

## 2019-11-16 DIAGNOSIS — Z87891 Personal history of nicotine dependence: Secondary | ICD-10-CM | POA: Diagnosis not present

## 2019-11-16 DIAGNOSIS — K3533 Acute appendicitis with perforation and localized peritonitis, with abscess: Secondary | ICD-10-CM | POA: Diagnosis not present

## 2019-11-16 DIAGNOSIS — R778 Other specified abnormalities of plasma proteins: Secondary | ICD-10-CM | POA: Diagnosis not present

## 2019-11-16 DIAGNOSIS — Z79899 Other long term (current) drug therapy: Secondary | ICD-10-CM | POA: Diagnosis not present

## 2019-11-16 DIAGNOSIS — Z833 Family history of diabetes mellitus: Secondary | ICD-10-CM | POA: Diagnosis not present

## 2019-11-16 DIAGNOSIS — K632 Fistula of intestine: Secondary | ICD-10-CM | POA: Diagnosis not present

## 2019-11-16 DIAGNOSIS — R9431 Abnormal electrocardiogram [ECG] [EKG]: Secondary | ICD-10-CM | POA: Diagnosis not present

## 2019-11-16 DIAGNOSIS — K3580 Unspecified acute appendicitis: Secondary | ICD-10-CM | POA: Diagnosis not present

## 2019-11-16 DIAGNOSIS — Z885 Allergy status to narcotic agent status: Secondary | ICD-10-CM | POA: Diagnosis not present

## 2019-11-16 DIAGNOSIS — Z01812 Encounter for preprocedural laboratory examination: Secondary | ICD-10-CM | POA: Insufficient documentation

## 2019-11-16 DIAGNOSIS — I700 Atherosclerosis of aorta: Secondary | ICD-10-CM | POA: Diagnosis not present

## 2019-11-16 DIAGNOSIS — R1013 Epigastric pain: Secondary | ICD-10-CM | POA: Diagnosis not present

## 2019-11-16 DIAGNOSIS — E785 Hyperlipidemia, unspecified: Secondary | ICD-10-CM | POA: Diagnosis not present

## 2019-11-16 DIAGNOSIS — R918 Other nonspecific abnormal finding of lung field: Secondary | ICD-10-CM | POA: Diagnosis not present

## 2019-11-16 DIAGNOSIS — F411 Generalized anxiety disorder: Secondary | ICD-10-CM | POA: Diagnosis not present

## 2019-11-16 DIAGNOSIS — Y828 Other medical devices associated with adverse incidents: Secondary | ICD-10-CM | POA: Diagnosis present

## 2019-11-16 DIAGNOSIS — I10 Essential (primary) hypertension: Secondary | ICD-10-CM | POA: Diagnosis not present

## 2019-11-16 DIAGNOSIS — Z8249 Family history of ischemic heart disease and other diseases of the circulatory system: Secondary | ICD-10-CM | POA: Diagnosis not present

## 2019-11-16 DIAGNOSIS — Z20822 Contact with and (suspected) exposure to covid-19: Secondary | ICD-10-CM | POA: Diagnosis not present

## 2019-11-16 DIAGNOSIS — Z888 Allergy status to other drugs, medicaments and biological substances status: Secondary | ICD-10-CM | POA: Diagnosis not present

## 2019-11-16 DIAGNOSIS — J9811 Atelectasis: Secondary | ICD-10-CM | POA: Diagnosis not present

## 2019-11-16 DIAGNOSIS — N179 Acute kidney failure, unspecified: Secondary | ICD-10-CM | POA: Diagnosis not present

## 2019-11-16 DIAGNOSIS — Z8719 Personal history of other diseases of the digestive system: Secondary | ICD-10-CM | POA: Diagnosis not present

## 2019-11-16 DIAGNOSIS — R079 Chest pain, unspecified: Secondary | ICD-10-CM | POA: Diagnosis not present

## 2019-11-16 DIAGNOSIS — K3532 Acute appendicitis with perforation and localized peritonitis, without abscess: Secondary | ICD-10-CM | POA: Diagnosis not present

## 2019-11-16 DIAGNOSIS — K567 Ileus, unspecified: Secondary | ICD-10-CM | POA: Diagnosis not present

## 2019-11-16 DIAGNOSIS — T8183XA Persistent postprocedural fistula, initial encounter: Secondary | ICD-10-CM | POA: Diagnosis not present

## 2019-11-16 DIAGNOSIS — D62 Acute posthemorrhagic anemia: Secondary | ICD-10-CM | POA: Diagnosis not present

## 2019-11-16 DIAGNOSIS — R9430 Abnormal result of cardiovascular function study, unspecified: Secondary | ICD-10-CM | POA: Diagnosis not present

## 2019-11-16 DIAGNOSIS — K8020 Calculus of gallbladder without cholecystitis without obstruction: Secondary | ICD-10-CM | POA: Diagnosis not present

## 2019-11-16 DIAGNOSIS — E860 Dehydration: Secondary | ICD-10-CM | POA: Diagnosis not present

## 2019-11-16 DIAGNOSIS — K30 Functional dyspepsia: Secondary | ICD-10-CM | POA: Diagnosis not present

## 2019-11-16 LAB — HEMOGLOBIN A1C
Hgb A1c MFr Bld: 5.4 % (ref 4.8–5.6)
Mean Plasma Glucose: 108.28 mg/dL

## 2019-11-16 LAB — CBC WITH DIFFERENTIAL/PLATELET
Abs Immature Granulocytes: 0.01 10*3/uL (ref 0.00–0.07)
Basophils Absolute: 0.1 10*3/uL (ref 0.0–0.1)
Basophils Relative: 1 %
Eosinophils Absolute: 0.3 10*3/uL (ref 0.0–0.5)
Eosinophils Relative: 7 %
HCT: 40.5 % (ref 36.0–46.0)
Hemoglobin: 13.4 g/dL (ref 12.0–15.0)
Immature Granulocytes: 0 %
Lymphocytes Relative: 36 %
Lymphs Abs: 1.7 10*3/uL (ref 0.7–4.0)
MCH: 30.6 pg (ref 26.0–34.0)
MCHC: 33.1 g/dL (ref 30.0–36.0)
MCV: 92.5 fL (ref 80.0–100.0)
Monocytes Absolute: 0.4 10*3/uL (ref 0.1–1.0)
Monocytes Relative: 9 %
Neutro Abs: 2.2 10*3/uL (ref 1.7–7.7)
Neutrophils Relative %: 47 %
Platelets: 315 10*3/uL (ref 150–400)
RBC: 4.38 MIL/uL (ref 3.87–5.11)
RDW: 14.1 % (ref 11.5–15.5)
WBC: 4.8 10*3/uL (ref 4.0–10.5)
nRBC: 0 % (ref 0.0–0.2)

## 2019-11-16 LAB — TYPE AND SCREEN
ABO/RH(D): A NEG
Antibody Screen: NEGATIVE

## 2019-11-16 LAB — BASIC METABOLIC PANEL
Anion gap: 10 (ref 5–15)
BUN: 8 mg/dL (ref 8–23)
CO2: 23 mmol/L (ref 22–32)
Calcium: 9.2 mg/dL (ref 8.9–10.3)
Chloride: 96 mmol/L — ABNORMAL LOW (ref 98–111)
Creatinine, Ser: 0.69 mg/dL (ref 0.44–1.00)
GFR calc Af Amer: >60 mL/min (ref >60)
GFR calc non Af Amer: >60 mL/min (ref >60)
Glucose, Bld: 97 mg/dL (ref 70–99)
Potassium: 4.2 mmol/L (ref 3.5–5.1)
Sodium: 129 mmol/L — ABNORMAL LOW (ref 135–145)

## 2019-11-16 LAB — SARS CORONAVIRUS 2 (TAT 6-24 HRS): SARS Coronavirus 2: NEGATIVE

## 2019-11-16 MED ORDER — METRONIDAZOLE 500 MG PO TABS: 1000.0000 mg | ORAL_TABLET | ORAL | 0 refills | Status: DC

## 2019-11-16 MED ORDER — DULCOLAX 5 MG PO TBEC
20.0000 mg | DELAYED_RELEASE_TABLET | Freq: Once | ORAL | 0 refills | Status: AC
Start: 2019-11-16 — End: 2019-11-16

## 2019-11-16 MED ORDER — NEOMYCIN SULFATE 500 MG PO TABS: 1000.0000 mg | ORAL_TABLET | ORAL | 0 refills | Status: DC

## 2019-11-16 NOTE — Progress Notes (Signed)
[facility] History and Physical  Reason for Visit: Perforated appendicitis with abscess, fistula to ileum from IR drain   Chief Complaint    Follow-up      Danielle Morris is a 64 y.o. female.  HPI: Danielle Morris is a 65 yo well known to me s/p perforated appendicitis with abscess s/p IR drain. 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 She has a history of significant colorectal polyps and had been told to follow up with colonoscopy in 2-3 years but never did. She is also quite thin, and we discussed that this could not be from appendicitis and could be from something else like a cancer or inflammatory bowel disease.  Given the continued fistula to the ileum, we are going to have to explore and plan for surgery in the upcoming week. She has pain from the drain which enters from her gluteal region on the right. She has lower abdominal pain too. Her CT is reassuring with no abscess or inflammation.    She denies any recent fevers. She has been eating and drinking and tolerating a diet.   Past Medical History:  Diagnosis Date  . GAD (generalized anxiety disorder)   . HLD (hyperlipidemia)   . Hypertension   . Panic attack   . Tobacco abuse     Past Surgical History:  Procedure Laterality Date  . COLONOSCOPY WITH PROPOFOL N/A 11/02/2013   Procedure: COLONOSCOPY WITH PROPOFOL;  Surgeon: Danie Binder, MD;  Location: [facility];  Service: Endoscopy;  Laterality: N/A;  in cecum @ 1002; cecal withdrawal time- minutes  . HEMORRHOID BANDING N/A 11/02/2013   Procedure: HEMORRHOID BANDING;  Surgeon: Danie Binder, MD;  Location: [facility];  Service: Endoscopy;  Laterality: N/A;  . HEMORRHOID SURGERY N/A 11/05/2013   Procedure: EXTENSIVE HEMORRHOIDECTOMY ;  Surgeon: Jamesetta So, MD;  Location: [facility];  Service: General;  Laterality: N/A;  . IR RADIOLOGIST EVAL & MGMT  10/05/2019  . IR RADIOLOGIST EVAL & MGMT   10/19/2019  . IR RADIOLOGIST EVAL & MGMT  11/10/2019  . POLYPECTOMY N/A 11/02/2013   Procedure: POLYPECTOMY;  Surgeon: Danie Binder, MD;  Location: [facility];  Service: Endoscopy;  Laterality: N/A;  sigmoid colon, hepatic flexure, hyperplastic rectal, and rectal polyps  . TONSILLECTOMY     age  60    Family History  Problem Relation Age of Onset  . Heart disease Father 47       MI--H/O Rheumatic Fever as a child  . Hypertension Brother   . Hypertension Brother   . Diabetes Other     Social History   Tobacco Use  . Smoking status: Former Smoker    Packs/day: 0.50    Years: 10.00    Pack years: 5.00    Types: Cigarettes    Quit date: 09/09/2019    Years since quitting: 0.1  . Smokeless tobacco: Never Used  . Tobacco comment: uses E-cigs  Substance Use Topics  . Alcohol use: No  . Drug use: No    Medications: I have reviewed the patient's current medications. Allergies as of 11/16/2019      Reactions   Amlodipine Shortness Of Breath, Anxiety, Palpitations   Tramadol    Rushes thru body, miserable, shaking and crying   Hydrocodone Nausea And Vomiting   Lisinopril Other (See Comments)   unknown   Chantix [varenicline] Nausea Only, Anxiety  Medication List       Accurate as of November 16, 2019 10:23 AM. If you have any questions, ask your nurse or doctor.        STOP taking these medications   atorvastatin 40 MG tablet Commonly known as: LIPITOR Stopped by: Virl Cagey, MD     TAKE these medications   acetaminophen 500 MG tablet Commonly known as: TYLENOL Take 500 mg by mouth every 6 (six) hours as needed for moderate pain or headache.   cyclobenzaprine 5 MG tablet Commonly known as: FLEXERIL Take 1 tablet (5 mg total) by mouth 3 (three) times daily as needed for muscle spasms.   docusate calcium 240 MG capsule Commonly known as: SURFAK Take 240 mg by mouth at bedtime.   doxylamine (Sleep) 25 MG tablet Commonly known as: UNISOM Take 25 mg by mouth at  bedtime as needed for sleep.   losartan 100 MG tablet Commonly known as: COZAAR TAKE 1 TABLET(100 MG) BY MOUTH DAILY What changed: See the new instructions.   NUPERCAINAL RE Place 1 application rectally daily as needed (pain).   ondansetron 8 MG disintegrating tablet Commonly known as: Zofran ODT Take 1 tablet (8 mg total) by mouth every 8 (eight) hours as needed for nausea or vomiting.   oxycodone 5 MG capsule Commonly known as: OXY-IR Take 1 capsule (5 mg total) by mouth every 4 (four) hours as needed for pain.   sertraline 50 MG tablet Commonly known as: ZOLOFT TAKE 1 TABLET(50 MG) BY MOUTH DAILY   Stool Softener 100 MG capsule Generic drug: docusate sodium Take 100 mg by mouth daily.        ROS:  A comprehensive review of systems was negative except for: Constitutional: positive for fatigue and malaise Gastrointestinal: positive for abdominal pain, nausea and gluteal pain from drain  Blood pressure 118/81, pulse 80, temperature 98 F (36.7 C), temperature source Oral, resp. rate 14, height 5\' 7"  (1.702 m), weight 133 lb (60.3 kg), SpO2 98 %. Physical Exam Vitals reviewed.  Constitutional:      Appearance: She is normal weight.  HENT:     Head: Normocephalic and atraumatic.     Nose: Nose normal.     Mouth/Throat:     Mouth: Mucous membranes are moist.  Eyes:     Extraocular Movements: Extraocular movements intact.     Pupils: Pupils are equal, round, and reactive to light.  Cardiovascular:     Rate and Rhythm: Normal rate and regular rhythm.  Pulmonary:     Effort: Pulmonary effort is normal.     Breath sounds: Normal breath sounds.  Abdominal:     General: There is no distension.     Palpations: Abdomen is soft.     Tenderness: There is abdominal tenderness in the right lower quadrant and suprapubic area.     Comments: Drain entering right glute, feces in drain bag  Musculoskeletal:        General: No swelling. Normal range of motion.     Cervical  back: Normal range of motion.  Skin:    General: Skin is warm and dry.  Neurological:     General: No focal deficit present.     Mental Status: She is alert and oriented to person, place, and time.  Psychiatric:        Mood and Affect: Mood normal.        Behavior: Behavior normal.        Thought Content: Thought content normal.  Judgment: Judgment normal.     Results: Results for orders placed or performed during the hospital encounter of 11/16/19 (from the past 48 hour(s))  Type and screen [facility]     Status: None   Collection Time: 11/16/19  8:39 AM  Result Value Ref Range   ABO/RH(D) A NEG    Antibody Screen NEG    Sample Expiration      11/19/2019,2359 Performed at [facility], [address] [place], [place] [address]   CBC with Differential/Platelet     Status: None   Collection Time: 11/16/19  8:39 AM  Result Value Ref Range   WBC 4.8 4.0 - 10.5 K/uL   RBC 4.38 3.87 - 5.11 MIL/uL   Hemoglobin 13.4 12.0 - 15.0 g/dL   HCT 40.5 36.0 - 46.0 %   MCV 92.5 80.0 - 100.0 fL   MCH 30.6 26.0 - 34.0 pg   MCHC 33.1 30.0 - 36.0 g/dL   RDW 14.1 11.5 - 15.5 %   Platelets 315 150 - 400 K/uL   nRBC 0.0 0.0 - 0.2 %   Neutrophils Relative % 47 %   Neutro Abs 2.2 1.7 - 7.7 K/uL   Lymphocytes Relative 36 %   Lymphs Abs 1.7 0.7 - 4.0 K/uL   Monocytes Relative 9 %   Monocytes Absolute 0.4 0.1 - 1.0 K/uL   Eosinophils Relative 7 %   Eosinophils Absolute 0.3 0.0 - 0.5 K/uL   Basophils Relative 1 %   Basophils Absolute 0.1 0.0 - 0.1 K/uL   Immature Granulocytes 0 %   Abs Immature Granulocytes 0.01 0.00 - 0.07 K/uL    Comment: Performed at [facility], [address] [place], [place] [address]  Basic metabolic panel     Status: Abnormal   Collection Time: 11/16/19  8:39 AM  Result Value Ref Range   Sodium 129 (L) 135 - 145 mmol/L   Potassium 4.2 3.5 - 5.1 mmol/L   Chloride 96 (L) 98 - 111 mmol/L   CO2 23 22 - 32 mmol/L   Glucose, Bld 97 70 - 99 mg/dL     Comment: Glucose reference range applies only to samples taken after fasting for at least 8 hours.   BUN 8 8 - 23 mg/dL   Creatinine, Ser 0.69 0.44 - 1.00 mg/dL   Calcium 9.2 8.9 - 10.3 mg/dL   GFR calc non Af Amer >60 >60 mL/min   GFR calc Af Amer >60 >60 mL/min   Anion gap 10 5 - 15    Comment: Performed at [facility], [address] [place], [place] [address]    CT a/p and IR studies reviewed- fistula to what we think is the ileum, resolved abscess   Assessment & Plan:  Danielle Morris is a 64 y.o. female s/p IR drain for what we suspect is perforated appendicitis with abscess. This has resolved and the abscess is gone but a fistula developed between the ileum and the drain we think. I have had multiple discussions with the patient and family that we may be dealing with something other than appendicitis. We discussed initially plan for colonoscopy prior to surgery but with the fistula this will not be possible.  Plan for open right hemicolectomy if this drain is indeed involving the ileum versus just an appendectomy if the remaining small and colon is healthy.   Discussed risk of bleeding, infection, anastomotic leak, need for further surgery, injury to other organs or ureter, potential for colectomy versus  just appendectomy.  Will need colonoscopy in 8-12 weeks after surgery.   -Discussed preop COVID testing  -Bowel preparation:  Buy from the Store: Miralax bottle (288g).  Gatorade 64 oz (not red). Dulcolax tablets.   The Day Prior to Surgery: Take 4 ducolax tablets at 7am with water. Drink plenty of clear liquids all day to avoid dehydration, no solid food.    Mix the bottle of Miralax and 64 oz of Gatorade and drink this mixture starting at 10am. Drink it gradually over the next few hours, 8 ounces every 15-30 minutes until it is gone. Finish this by 2pm.  Take 2 neomycin 500mg  tablets and 2 metronidazole 500mg  tablets at 2 pm. Take 2 neomycin 500mg  tablets and 2 metronidazole  500mg  tablets at 3pm. Take 2 neomycin 500mg  tablets and 2 metronidazole 500mg  tablets at 10pm.    Do not eat or drink anything after midnight the night before your surgery.  Do not eat or drink anything that morning, and take medications as instructed by the hospital staff on your preoperative visit.   All questions were answered to the satisfaction of the patient and family.     Virl Cagey 11/16/2019, 10:23 AM

## 2019-11-16 NOTE — Patient Instructions (Addendum)
Colon Preparation:  Buy from the Store: Miralax bottle (255g).  Gatorade 64 oz (not red).  The Day Prior to Surgery: Take 4 ducolax tablets (20 mg total) at 7am with water. Drink plenty of clear liquids all day to avoid dehydration, no solid food.    Mix the bottle of Miralax and 64 oz of Gatorade and drink this mixture starting at 10am. Drink it gradually over the next few hours, 8 ounces every 15-30 minutes until it is gone. Finish this by 2pm.  Take 2 neomycin 500mg  tablets and 2 metronidazole 500mg  tablets at 2 pm. Take 2 neomycin 500mg  tablets and 2 metronidazole 500mg  tablets at 3pm. Take 2 neomycin 500mg  tablets and 2 metronidazole 500mg  tablets at 10pm.    Do not eat or drink anything after midnight the night before your surgery.  Do not eat or drink anything that morning, and take medications as instructed by the hospital staff on your preoperative visit.    Open Colectomy An open colectomy is surgery to remove part or all of the large intestine (colon). This procedure may be used to treat several conditions, including:  Inflammation and infection of the colon (diverticulitis).  Tumors or masses in the colon.  Inflammatory bowel disease, such as Crohn disease or ulcerative colitis.  Bleeding from the colon.  Blockage or obstruction of the colon. Tell a health care provider about:  Any allergies you have.  All medicines you are taking, including vitamins, herbs, eye drops, creams, and over-the-counter medicines.  Any problems you or family members have had with anesthetic medicines.  Any blood disorders you have.  Any surgeries you have had.  Any medical conditions you have.  Whether you are pregnant or may be pregnant.  Whether you smoke or use tobacco products. These can affect your body's reaction to anesthesia. What are the risks? Generally, this is a safe procedure. However, problems may occur, including:  Infection.  Bleeding.  Allergic reactions to  medicines.  Damage to other structures or organs.  Pneumonia.  The incision opening up.  Tissues from inside the abdomen bulging through the incision (hernia).  Reopening of the colon where it was stitched or stapled together.  A blood clot forming in a vein and traveling to the lungs.  Future blockage of the small intestine from scar tissue. Bowel prep In some cases, you may be prescribed an oral bowel prep to clean out your colon. If so:  Take it as told by your health care provider. Starting the day before your procedure, you may need to drink a large amount of medicated liquid. The liquid will cause you to have multiple loose stools until your stool is almost clear or light green.  Follow instructions from your health care provider about eating and drinking restrictions during bowel prep. Medicines  Ask your health care provider about: ? Changing or stopping your regular medicines or vitamins. This is especially important if you are taking diabetes medicines, blood thinners, or vitamin E. ? Taking medicines such as aspirin and ibuprofen. These medicines can thin your blood. Do not take these medicines before your procedure if your health care provider instructs you not to.  If you were prescribed an antibiotic medicine, take it as told by your health care provider. General instructions  Bring loose-fitting, comfortable clothing and slip-on shoes that you can put on without bending over.  Make sure to see your health care provider for any tests that you need before the procedure, such as: ? Blood tests. ?  A test to check the heart's rhythm (electrocardiogram, ECG). ? A CT scan of your abdomen. ? Urine tests. ? Colonoscopy.  Plan to have someone take you home from the hospital or clinic.  Arrange for someone to help you with your activities during your recovery. What happens during the procedure?  To reduce your risk of infection: ? Your health care team will wash or  sanitize their hands. ? Your skin will be washed with soap. ? Hair may be removed from the surgical area.  An IV tube will be inserted into one of your veins. The tube will be used to give you medicines and fluids.  You will be given a medicine to make you fall asleep (general anesthetic). You may also be given a medicine to help you relax (sedative).  Small monitors will be connected to your body. They will be used to check your heart, blood pressure, and oxygen level.  A breathing tube may be placed into your lungs during the procedure.  A thin, flexible tube (catheter) will be placed into your bladder to drain urine.  A tube may be inserted through your nose and into your stomach (nasogastric tube, or NG tube). The tube is used to remove stomach fluids after surgery until the intestines start working again.  An incision will be made in your abdomen.  Clamps or staples will be put on your colon.  The part of the colon between the clamps or staples will be removed.  The ends of the colon that remain will be stitched or stapled together.  The incision in your abdomen will be closed with stitches (sutures) or staples.  The incision will be covered with a bandage (dressing).  A small opening (stoma) may be created in your lower abdomen. A removable, external pouch (ostomy pouch) will be attached to the stoma. This pouch will collect stool outside of your body. Stool passes through the stoma and into the pouch instead of through your anus. The procedure may vary among health care providers and hospitals. What happens after the procedure?  Your blood pressure, heart rate, breathing rate, and blood oxygen level will be monitored until the medicines you were given have worn off.  You may continue to receive fluids and medicines through an IV tube.  You will start on a clear liquid diet and gradually go back to a normal diet.  Do not drive until your health care provider  approves.  You may have some pain in your abdomen. You will be given pain medicine to control the pain.  You will be encouraged to do the following: ? Do breathing exercises to prevent pneumonia. ? Get up and start walking within a day after surgery. You should try to get up 5-6 times a day. This information is not intended to replace advice given to you by your health care provider. Make sure you discuss any questions you have with your health care provider. Document Revised: 11/20/2018 Document Reviewed: 02/26/2016 Elsevier Patient Education  [place].

## 2019-11-17 ENCOUNTER — Other Ambulatory Visit: Payer: BC Managed Care – PPO

## 2019-11-17 NOTE — H&P (Signed)
[facility] History and Physical  Reason for Visit: Perforated appendicitis with abscess, fistula to ileum from IR drain   Chief Complaint    Follow-up      Danielle Morris is a 64 y.o. female.  HPI: Danielle Morris is a 64 yo well known to me s/p perforated appendicitis with abscess s/p IR drain. 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 Danielle Morris has a history of significant colorectal polyps and had been told to follow up with colonoscopy in 2-3 years but never did. Danielle Morris is also quite thin, and we discussed that this could not be from appendicitis and could be from something else like a cancer or inflammatory bowel disease.  Given Danielle continued fistula to Danielle ileum, we are going to have to explore and plan for surgery in Danielle upcoming week. Danielle Morris has pain from Danielle drain which enters from her gluteal region on Danielle right. Danielle Morris has lower abdominal pain too. Her CT is reassuring with no abscess or inflammation.    Danielle Morris denies any recent fevers. Danielle Morris has been eating and drinking and tolerating a diet.   Past Medical History:  Diagnosis Date  . GAD (generalized anxiety disorder)   . HLD (hyperlipidemia)   . Hypertension   . Panic attack   . Tobacco abuse     Past Surgical History:  Procedure Laterality Date  . COLONOSCOPY WITH PROPOFOL N/A 11/02/2013   Procedure: COLONOSCOPY WITH PROPOFOL;  Surgeon: Danie Binder, MD;  Location: [facility];  Service: Endoscopy;  Laterality: N/A;  in cecum @ 1002; cecal withdrawal time- minutes  . HEMORRHOID BANDING N/A 11/02/2013   Procedure: HEMORRHOID BANDING;  Surgeon: Danie Binder, MD;  Location: [facility];  Service: Endoscopy;  Laterality: N/A;  . HEMORRHOID SURGERY N/A 11/05/2013   Procedure: EXTENSIVE HEMORRHOIDECTOMY ;  Surgeon: Jamesetta So, MD;  Location: [facility];  Service: General;  Laterality: N/A;  . IR RADIOLOGIST EVAL & MGMT  10/05/2019  . IR RADIOLOGIST EVAL & MGMT   10/19/2019  . IR RADIOLOGIST EVAL & MGMT  11/10/2019  . POLYPECTOMY N/A 11/02/2013   Procedure: POLYPECTOMY;  Surgeon: Danie Binder, MD;  Location: [facility];  Service: Endoscopy;  Laterality: N/A;  sigmoid colon, hepatic flexure, hyperplastic rectal, and rectal polyps  . TONSILLECTOMY     age  37    Family History  Problem Relation Age of Onset  . Heart disease Father 74       MI--H/O Rheumatic Fever as a child  . Hypertension Brother   . Hypertension Brother   . Diabetes Other     Social History   Tobacco Use  . Smoking status: Former Smoker    Packs/day: 0.50    Years: 10.00    Pack years: 5.00    Types: Cigarettes    Quit date: 09/09/2019    Years since quitting: 0.1  . Smokeless tobacco: Never Used  . Tobacco comment: uses E-cigs  Substance Use Topics  . Alcohol use: No  . Drug use: No    Medications: I have reviewed Danielle Morris's current medications. Allergies as of 11/16/2019      Reactions   Amlodipine Shortness Of Breath, Anxiety, Palpitations   Tramadol    Rushes thru body, miserable, shaking and crying   Hydrocodone Nausea And Vomiting   Lisinopril Other (See Comments)   unknown   Chantix [varenicline] Nausea Only, Anxiety  Medication List       Accurate as of November 16, 2019 10:23 AM. If you have any questions, ask your nurse or doctor.        STOP taking these medications   atorvastatin 40 MG tablet Commonly known as: LIPITOR Stopped by: Virl Cagey, MD     TAKE these medications   acetaminophen 500 MG tablet Commonly known as: TYLENOL Take 500 mg by mouth every 6 (six) hours as needed for moderate pain or headache.   cyclobenzaprine 5 MG tablet Commonly known as: FLEXERIL Take 1 tablet (5 mg total) by mouth 3 (three) times daily as needed for muscle spasms.   docusate calcium 240 MG capsule Commonly known as: SURFAK Take 240 mg by mouth at bedtime.   doxylamine (Sleep) 25 MG tablet Commonly known as: UNISOM Take 25 mg by mouth at  bedtime as needed for sleep.   losartan 100 MG tablet Commonly known as: COZAAR TAKE 1 TABLET(100 MG) BY MOUTH DAILY What changed: See Danielle new instructions.   NUPERCAINAL RE Place 1 application rectally daily as needed (pain).   ondansetron 8 MG disintegrating tablet Commonly known as: Zofran ODT Take 1 tablet (8 mg total) by mouth every 8 (eight) hours as needed for nausea or vomiting.   oxycodone 5 MG capsule Commonly known as: OXY-IR Take 1 capsule (5 mg total) by mouth every 4 (four) hours as needed for pain.   sertraline 50 MG tablet Commonly known as: ZOLOFT TAKE 1 TABLET(50 MG) BY MOUTH DAILY   Stool Softener 100 MG capsule Generic drug: docusate sodium Take 100 mg by mouth daily.        ROS:  A comprehensive review of systems was negative except for: Constitutional: positive for fatigue and malaise Gastrointestinal: positive for abdominal pain, nausea and gluteal pain from drain  Blood pressure 118/81, pulse 80, temperature 98 F (36.7 C), temperature source Oral, resp. rate 14, height 5\' 7"  (1.702 m), weight 133 lb (60.3 kg), SpO2 98 %. Physical Exam Vitals reviewed.  Constitutional:      Appearance: Danielle Morris is normal weight.  HENT:     Head: Normocephalic and atraumatic.     Nose: Nose normal.     Mouth/Throat:     Mouth: Mucous membranes are moist.  Eyes:     Extraocular Movements: Extraocular movements intact.     Pupils: Pupils are equal, round, and reactive to light.  Cardiovascular:     Rate and Rhythm: Normal rate and regular rhythm.  Pulmonary:     Effort: Pulmonary effort is normal.     Breath sounds: Normal breath sounds.  Abdominal:     General: There is no distension.     Palpations: Abdomen is soft.     Tenderness: There is abdominal tenderness in Danielle right lower quadrant and suprapubic area.     Comments: Drain entering right glute, feces in drain bag  Musculoskeletal:        General: No swelling. Normal range of motion.     Cervical  back: Normal range of motion.  Skin:    General: Skin is warm and dry.  Neurological:     General: No focal deficit present.     Mental Status: Danielle Morris is alert and oriented to person, place, and time.  Psychiatric:        Mood and Affect: Mood normal.        Behavior: Behavior normal.        Thought Content: Thought content normal.  Judgment: Judgment normal.     Results: Results for orders placed or performed during Danielle hospital encounter of 11/16/19 (from Danielle past 48 hour(s))  Type and screen [facility]     Status: None   Collection Time: 11/16/19  8:39 AM  Result Value Ref Range   ABO/RH(D) A NEG    Antibody Screen NEG    Sample Expiration      11/19/2019,2359 Performed at [facility], [address], [place], [place] [address]   CBC with Differential/Platelet     Status: None   Collection Time: 11/16/19  8:39 AM  Result Value Ref Range   WBC 4.8 4.0 - 10.5 K/uL   RBC 4.38 3.87 - 5.11 MIL/uL   Hemoglobin 13.4 12.0 - 15.0 g/dL   HCT 40.5 36.0 - 46.0 %   MCV 92.5 80.0 - 100.0 fL   MCH 30.6 26.0 - 34.0 pg   MCHC 33.1 30.0 - 36.0 g/dL   RDW 14.1 11.5 - 15.5 %   Platelets 315 150 - 400 K/uL   nRBC 0.0 0.0 - 0.2 %   Neutrophils Relative % 47 %   Neutro Abs 2.2 1.7 - 7.7 K/uL   Lymphocytes Relative 36 %   Lymphs Abs 1.7 0.7 - 4.0 K/uL   Monocytes Relative 9 %   Monocytes Absolute 0.4 0.1 - 1.0 K/uL   Eosinophils Relative 7 %   Eosinophils Absolute 0.3 0.0 - 0.5 K/uL   Basophils Relative 1 %   Basophils Absolute 0.1 0.0 - 0.1 K/uL   Immature Granulocytes 0 %   Abs Immature Granulocytes 0.01 0.00 - 0.07 K/uL    Comment: Performed at [facility], [address] [place], [place] [address]  Basic metabolic panel     Status: Abnormal   Collection Time: 11/16/19  8:39 AM  Result Value Ref Range   Sodium 129 (L) 135 - 145 mmol/L   Potassium 4.2 3.5 - 5.1 mmol/L   Chloride 96 (L) 98 - 111 mmol/L   CO2 23 22 - 32 mmol/L   Glucose, Bld 97 70 - 99 mg/dL     Comment: Glucose reference range applies only to samples taken after fasting for at least 8 hours.   BUN 8 8 - 23 mg/dL   Creatinine, Ser 0.69 0.44 - 1.00 mg/dL   Calcium 9.2 8.9 - 10.3 mg/dL   GFR calc non Af Amer >60 >60 mL/min   GFR calc Af Amer >60 >60 mL/min   Anion gap 10 5 - 15    Comment: Performed at [facility], [address], [place], [place] [address]    CT a/p and IR studies reviewed- fistula to what we think is Danielle ileum, resolved abscess   Assessment & Plan:  Danielle Morris is a 64 y.o. female s/p IR drain for what we suspect is perforated appendicitis with abscess. This has resolved and Danielle abscess is gone but a fistula developed between Danielle ileum and Danielle drain we think. I have had multiple discussions with Danielle Morris and family that we may be dealing with something other than appendicitis. We discussed initially plan for colonoscopy prior to surgery but with Danielle fistula this will not be possible.  Plan for open right hemicolectomy if this drain is indeed involving Danielle ileum versus just an appendectomy if Danielle remaining small and colon is healthy.   Discussed risk of bleeding, infection, anastomotic leak, need for further surgery, injury to other organs or ureter, potential for colectomy versus  just appendectomy.  Will need colonoscopy in 8-12 weeks after surgery.   -Discussed preop COVID testing  -Bowel preparation:  Buy from Danielle Store: Miralax bottle (288g).  Gatorade 64 oz (not red). Dulcolax tablets.   Danielle Day Prior to Surgery: Take 4 ducolax tablets at 7am with water. Drink plenty of clear liquids all day to avoid dehydration, no solid food.    Mix Danielle bottle of Miralax and 64 oz of Gatorade and drink this mixture starting at 10am. Drink it gradually over Danielle next few hours, 8 ounces every 15-30 minutes until it is gone. Finish this by 2pm.  Take 2 neomycin 500mg  tablets and 2 metronidazole 500mg  tablets at 2 pm. Take 2 neomycin 500mg  tablets and 2 metronidazole  500mg  tablets at 3pm. Take 2 neomycin 500mg  tablets and 2 metronidazole 500mg  tablets at 10pm.    Do not eat or drink anything after midnight Danielle night before your surgery.  Do not eat or drink anything that morning, and take medications as instructed by Danielle hospital staff on your preoperative visit.   All questions were answered to Danielle satisfaction of Danielle Morris and family.     Virl Cagey 11/16/2019, 10:23 AM

## 2019-11-18 ENCOUNTER — Ambulatory Visit: Payer: BC Managed Care – PPO | Admitting: General Surgery

## 2019-11-19 ENCOUNTER — Encounter (HOSPITAL_COMMUNITY)
Admission: RE | Disposition: A | Discharge status: Home / Self Care | Payer: Self-pay | Source: Home / Self Care | Attending: General Surgery

## 2019-11-19 ENCOUNTER — Inpatient Hospital Stay (HOSPITAL_COMMUNITY)
Admission: RE | Admit: 2019-11-19 | Discharge: 2019-11-25 | DRG: 907 | Disposition: A | Discharge status: Home / Self Care | Payer: BC Managed Care – PPO | Attending: General Surgery | Admitting: General Surgery

## 2019-11-19 ENCOUNTER — Other Ambulatory Visit: Payer: Self-pay

## 2019-11-19 ENCOUNTER — Encounter (HOSPITAL_COMMUNITY): Payer: Self-pay | Admitting: General Surgery

## 2019-11-19 ENCOUNTER — Inpatient Hospital Stay (HOSPITAL_COMMUNITY): Payer: BC Managed Care – PPO | Admitting: Anesthesiology

## 2019-11-19 DIAGNOSIS — E785 Hyperlipidemia, unspecified: Secondary | ICD-10-CM | POA: Diagnosis present

## 2019-11-19 DIAGNOSIS — K3532 Acute appendicitis with perforation and localized peritonitis, without abscess: Secondary | ICD-10-CM | POA: Diagnosis not present

## 2019-11-19 DIAGNOSIS — Z833 Family history of diabetes mellitus: Secondary | ICD-10-CM

## 2019-11-19 DIAGNOSIS — Z79899 Other long term (current) drug therapy: Secondary | ICD-10-CM

## 2019-11-19 DIAGNOSIS — R1013 Epigastric pain: Secondary | ICD-10-CM

## 2019-11-19 DIAGNOSIS — N179 Acute kidney failure, unspecified: Secondary | ICD-10-CM | POA: Diagnosis not present

## 2019-11-19 DIAGNOSIS — J9811 Atelectasis: Secondary | ICD-10-CM | POA: Diagnosis not present

## 2019-11-19 DIAGNOSIS — Z888 Allergy status to other drugs, medicaments and biological substances status: Secondary | ICD-10-CM

## 2019-11-19 DIAGNOSIS — Z8719 Personal history of other diseases of the digestive system: Secondary | ICD-10-CM

## 2019-11-19 DIAGNOSIS — K3580 Unspecified acute appendicitis: Secondary | ICD-10-CM | POA: Diagnosis not present

## 2019-11-19 DIAGNOSIS — F411 Generalized anxiety disorder: Secondary | ICD-10-CM | POA: Diagnosis present

## 2019-11-19 DIAGNOSIS — K632 Fistula of intestine: Secondary | ICD-10-CM | POA: Diagnosis present

## 2019-11-19 DIAGNOSIS — E860 Dehydration: Secondary | ICD-10-CM | POA: Diagnosis not present

## 2019-11-19 DIAGNOSIS — K30 Functional dyspepsia: Secondary | ICD-10-CM | POA: Diagnosis not present

## 2019-11-19 DIAGNOSIS — R079 Chest pain, unspecified: Secondary | ICD-10-CM | POA: Diagnosis not present

## 2019-11-19 DIAGNOSIS — I700 Atherosclerosis of aorta: Secondary | ICD-10-CM | POA: Diagnosis present

## 2019-11-19 DIAGNOSIS — R778 Other specified abnormalities of plasma proteins: Secondary | ICD-10-CM | POA: Diagnosis not present

## 2019-11-19 DIAGNOSIS — D62 Acute posthemorrhagic anemia: Secondary | ICD-10-CM | POA: Diagnosis not present

## 2019-11-19 DIAGNOSIS — R9431 Abnormal electrocardiogram [ECG] [EKG]: Secondary | ICD-10-CM | POA: Diagnosis not present

## 2019-11-19 DIAGNOSIS — K567 Ileus, unspecified: Secondary | ICD-10-CM | POA: Diagnosis not present

## 2019-11-19 DIAGNOSIS — Z20822 Contact with and (suspected) exposure to covid-19: Secondary | ICD-10-CM | POA: Diagnosis present

## 2019-11-19 DIAGNOSIS — I10 Essential (primary) hypertension: Secondary | ICD-10-CM | POA: Diagnosis not present

## 2019-11-19 DIAGNOSIS — Z8249 Family history of ischemic heart disease and other diseases of the circulatory system: Secondary | ICD-10-CM

## 2019-11-19 DIAGNOSIS — R9430 Abnormal result of cardiovascular function study, unspecified: Secondary | ICD-10-CM | POA: Diagnosis not present

## 2019-11-19 DIAGNOSIS — Z87891 Personal history of nicotine dependence: Secondary | ICD-10-CM

## 2019-11-19 DIAGNOSIS — Z885 Allergy status to narcotic agent status: Secondary | ICD-10-CM | POA: Diagnosis not present

## 2019-11-19 DIAGNOSIS — T8183XA Persistent postprocedural fistula, initial encounter: Principal | ICD-10-CM | POA: Diagnosis present

## 2019-11-19 DIAGNOSIS — K3533 Acute appendicitis with perforation and localized peritonitis, with abscess: Secondary | ICD-10-CM | POA: Diagnosis present

## 2019-11-19 DIAGNOSIS — Y828 Other medical devices associated with adverse incidents: Secondary | ICD-10-CM | POA: Diagnosis present

## 2019-11-19 DIAGNOSIS — R110 Nausea: Secondary | ICD-10-CM

## 2019-11-19 HISTORY — PX: PARTIAL COLECTOMY: SHX5273

## 2019-11-19 LAB — POCT I-STAT, CHEM 8
BUN: 5 mg/dL — ABNORMAL LOW (ref 8–23)
Calcium, Ion: 1.21 mmol/L (ref 1.15–1.40)
Chloride: 92 mmol/L — ABNORMAL LOW (ref 98–111)
Creatinine, Ser: 0.7 mg/dL (ref 0.44–1.00)
Glucose, Bld: 123 mg/dL — ABNORMAL HIGH (ref 70–99)
HCT: 46 % (ref 36.0–46.0)
Hemoglobin: 15.6 g/dL — ABNORMAL HIGH (ref 12.0–15.0)
Potassium: 3.9 mmol/L (ref 3.5–5.1)
Sodium: 132 mmol/L — ABNORMAL LOW (ref 135–145)
TCO2: 26 mmol/L (ref 22–32)

## 2019-11-19 SURGERY — COLECTOMY, PARTIAL
Anesthesia: General | Site: Abdomen

## 2019-11-19 MED ORDER — SODIUM CHLORIDE FLUSH 0.9 % IV SOLN
INTRAVENOUS | Status: AC
  Filled 2019-11-19: qty 10

## 2019-11-19 MED ORDER — SODIUM CHLORIDE 0.9 % IV SOLN
2.0000 g | Freq: Two times a day (BID) | INTRAVENOUS | Status: AC
  Administered 2019-11-19 – 2019-11-20 (×3): 2 g via INTRAVENOUS
  Filled 2019-11-19 (×3): qty 2

## 2019-11-19 MED ORDER — ENOXAPARIN SODIUM 40 MG/0.4ML ~~LOC~~ SOLN
40.0000 mg | SUBCUTANEOUS | Status: DC
  Administered 2019-11-20: 40 mg via SUBCUTANEOUS
  Filled 2019-11-19: qty 0.4

## 2019-11-19 MED ORDER — CHLORHEXIDINE GLUCONATE CLOTH 2 % EX PADS: 6.0000 | MEDICATED_PAD | Freq: Once | CUTANEOUS | Status: DC

## 2019-11-19 MED ORDER — GABAPENTIN 300 MG PO CAPS
300.0000 mg | ORAL_CAPSULE | Freq: Two times a day (BID) | ORAL | Status: DC
  Administered 2019-11-19 – 2019-11-24 (×11): 300 mg via ORAL
  Filled 2019-11-19 (×11): qty 1

## 2019-11-19 MED ORDER — ALUM & MAG HYDROXIDE-SIMETH 200-200-20 MG/5ML PO SUSP
30.0000 mL | Freq: Once | ORAL | Status: AC
  Administered 2019-11-20: 30 mL via ORAL
  Filled 2019-11-19: qty 30

## 2019-11-19 MED ORDER — CYCLOBENZAPRINE HCL 10 MG PO TABS
5.0000 mg | ORAL_TABLET | Freq: Three times a day (TID) | ORAL | Status: DC | PRN
  Administered 2019-11-19 – 2019-11-22 (×4): 5 mg via ORAL
  Filled 2019-11-19 (×4): qty 1

## 2019-11-19 MED ORDER — SUGAMMADEX SODIUM 500 MG/5ML IV SOLN
INTRAVENOUS | Status: DC | PRN
  Administered 2019-11-19: 100 mg via INTRAVENOUS

## 2019-11-19 MED ORDER — SIMETHICONE 80 MG PO CHEW
40.0000 mg | CHEWABLE_TABLET | Freq: Four times a day (QID) | ORAL | Status: DC | PRN
  Administered 2019-11-19 – 2019-11-21 (×4): 40 mg via ORAL
  Filled 2019-11-19 (×4): qty 1

## 2019-11-19 MED ORDER — DEXAMETHASONE SODIUM PHOSPHATE 10 MG/ML IJ SOLN
INTRAMUSCULAR | Status: AC
  Filled 2019-11-19: qty 1

## 2019-11-19 MED ORDER — ROCURONIUM BROMIDE 100 MG/10ML IV SOLN
INTRAVENOUS | Status: DC | PRN
  Administered 2019-11-19: 40 mg via INTRAVENOUS

## 2019-11-19 MED ORDER — SUCCINYLCHOLINE CHLORIDE 20 MG/ML IJ SOLN
INTRAMUSCULAR | Status: DC | PRN
  Administered 2019-11-19: 120 mg via INTRAVENOUS

## 2019-11-19 MED ORDER — DEXAMETHASONE SODIUM PHOSPHATE 10 MG/ML IJ SOLN
INTRAMUSCULAR | Status: DC | PRN
  Administered 2019-11-19: 10 mg via INTRAVENOUS

## 2019-11-19 MED ORDER — SCOPOLAMINE 1 MG/3DAYS TD PT72
1.0000 | MEDICATED_PATCH | TRANSDERMAL | Status: DC
  Administered 2019-11-19: 1.5 mg via TRANSDERMAL

## 2019-11-19 MED ORDER — SUFENTANIL CITRATE 50 MCG/ML IV SOLN
INTRAVENOUS | Status: AC
  Filled 2019-11-19: qty 1

## 2019-11-19 MED ORDER — MORPHINE SULFATE (PF) 2 MG/ML IV SOLN
2.0000 mg | INTRAVENOUS | Status: DC | PRN
  Administered 2019-11-19 – 2019-11-24 (×17): 2 mg via INTRAVENOUS
  Filled 2019-11-19 (×17): qty 1

## 2019-11-19 MED ORDER — DIPHENHYDRAMINE HCL 50 MG/ML IJ SOLN: 12.5000 mg | Freq: Four times a day (QID) | INTRAMUSCULAR | Status: DC | PRN

## 2019-11-19 MED ORDER — CHLORHEXIDINE GLUCONATE 0.12 % MT SOLN
15.0000 mL | Freq: Once | OROMUCOSAL | Status: AC
  Administered 2019-11-19: 15 mL via OROMUCOSAL

## 2019-11-19 MED ORDER — SODIUM CHLORIDE (PF) 0.9 % IJ SOLN
INTRAMUSCULAR | Status: AC
  Filled 2019-11-19: qty 10

## 2019-11-19 MED ORDER — ALVIMOPAN 12 MG PO CAPS
12.0000 mg | ORAL_CAPSULE | ORAL | Status: AC
  Administered 2019-11-19: 12 mg via ORAL
  Filled 2019-11-19: qty 1

## 2019-11-19 MED ORDER — SUFENTANIL CITRATE 50 MCG/ML IV SOLN
INTRAVENOUS | Status: DC | PRN
  Administered 2019-11-19: 5 ug via INTRAVENOUS
  Administered 2019-11-19 (×3): 10 ug via INTRAVENOUS
  Administered 2019-11-19 (×3): 5 ug via INTRAVENOUS

## 2019-11-19 MED ORDER — EPHEDRINE SULFATE 50 MG/ML IJ SOLN
INTRAMUSCULAR | Status: DC | PRN
Start: 2019-11-19 — End: 2019-11-19
  Administered 2019-11-19 (×2): 10 mg via INTRAVENOUS

## 2019-11-19 MED ORDER — LACTATED RINGERS IV SOLN: INTRAVENOUS | Status: DC

## 2019-11-19 MED ORDER — ROCURONIUM BROMIDE 10 MG/ML (PF) SYRINGE
PREFILLED_SYRINGE | INTRAVENOUS | Status: AC
  Filled 2019-11-19: qty 10

## 2019-11-19 MED ORDER — KETOROLAC TROMETHAMINE 30 MG/ML IJ SOLN
30.0000 mg | Freq: Three times a day (TID) | INTRAMUSCULAR | Status: DC
  Administered 2019-11-19 – 2019-11-20 (×3): 30 mg via INTRAVENOUS
  Filled 2019-11-19 (×3): qty 1

## 2019-11-19 MED ORDER — LACTATED RINGERS IV SOLN
Freq: Once | INTRAVENOUS | Status: AC
  Administered 2019-11-19: 1000 mL via INTRAVENOUS

## 2019-11-19 MED ORDER — ENSURE SURGERY PO LIQD
237.0000 mL | Freq: Two times a day (BID) | ORAL | Status: DC
  Administered 2019-11-19 – 2019-11-25 (×5): 237 mL via ORAL
  Filled 2019-11-19 (×15): qty 237

## 2019-11-19 MED ORDER — ENOXAPARIN SODIUM 40 MG/0.4ML ~~LOC~~ SOLN
40.0000 mg | Freq: Once | SUBCUTANEOUS | Status: AC
  Administered 2019-11-19: 40 mg via SUBCUTANEOUS
  Filled 2019-11-19: qty 0.4

## 2019-11-19 MED ORDER — OXYCODONE HCL 5 MG PO TABS
5.0000 mg | ORAL_TABLET | ORAL | Status: DC | PRN
  Administered 2019-11-19 – 2019-11-21 (×8): 10 mg via ORAL
  Administered 2019-11-21: 5 mg via ORAL
  Administered 2019-11-22 – 2019-11-24 (×8): 10 mg via ORAL
  Administered 2019-11-24 – 2019-11-25 (×2): 5 mg via ORAL
  Administered 2019-11-25: 10 mg via ORAL
  Filled 2019-11-19 (×10): qty 2
  Filled 2019-11-19 (×2): qty 1
  Filled 2019-11-19 (×7): qty 2
  Filled 2019-11-19: qty 1

## 2019-11-19 MED ORDER — EPHEDRINE 5 MG/ML INJ
INTRAVENOUS | Status: AC
  Filled 2019-11-19: qty 10

## 2019-11-19 MED ORDER — HYDROMORPHONE HCL 1 MG/ML IJ SOLN
0.2500 mg | INTRAMUSCULAR | Status: DC | PRN
  Administered 2019-11-19 (×4): 0.5 mg via INTRAVENOUS
  Filled 2019-11-19 (×4): qty 0.5

## 2019-11-19 MED ORDER — PROMETHAZINE HCL 25 MG/ML IJ SOLN
6.2500 mg | INTRAMUSCULAR | Status: DC | PRN
  Administered 2019-11-19: 12.5 mg via INTRAVENOUS
  Filled 2019-11-19: qty 1

## 2019-11-19 MED ORDER — MEPERIDINE HCL 50 MG/ML IJ SOLN: 6.2500 mg | INTRAMUSCULAR | Status: DC | PRN

## 2019-11-19 MED ORDER — LACTATED RINGERS IV SOLN
INTRAVENOUS | Status: DC | PRN
Start: 2019-11-19 — End: 2019-11-19

## 2019-11-19 MED ORDER — MIDAZOLAM HCL 2 MG/2ML IJ SOLN
INTRAMUSCULAR | Status: AC
  Filled 2019-11-19: qty 2

## 2019-11-19 MED ORDER — DIPHENHYDRAMINE HCL 12.5 MG/5ML PO ELIX
12.5000 mg | ORAL_SOLUTION | Freq: Four times a day (QID) | ORAL | Status: DC | PRN
  Administered 2019-11-22: 12.5 mg via ORAL
  Filled 2019-11-19 (×2): qty 5

## 2019-11-19 MED ORDER — BUPIVACAINE LIPOSOME 1.3 % IJ SUSP
INTRAMUSCULAR | Status: AC
  Filled 2019-11-19: qty 20

## 2019-11-19 MED ORDER — METOPROLOL TARTRATE 5 MG/5ML IV SOLN
5.0000 mg | Freq: Four times a day (QID) | INTRAVENOUS | Status: DC | PRN
  Administered 2019-11-22: 5 mg via INTRAVENOUS
  Filled 2019-11-19: qty 5

## 2019-11-19 MED ORDER — ORAL CARE MOUTH RINSE: 15.0000 mL | Freq: Once | OROMUCOSAL | Status: AC

## 2019-11-19 MED ORDER — BUPIVACAINE LIPOSOME 1.3 % IJ SUSP
INTRAMUSCULAR | Status: DC | PRN
  Administered 2019-11-19: 20 mL

## 2019-11-19 MED ORDER — ALVIMOPAN 12 MG PO CAPS
12.0000 mg | ORAL_CAPSULE | Freq: Two times a day (BID) | ORAL | Status: DC
  Administered 2019-11-20 – 2019-11-21 (×3): 12 mg via ORAL
  Filled 2019-11-19 (×3): qty 1

## 2019-11-19 MED ORDER — ONDANSETRON HCL 4 MG/2ML IJ SOLN
INTRAMUSCULAR | Status: AC
  Filled 2019-11-19: qty 2

## 2019-11-19 MED ORDER — ACETAMINOPHEN 500 MG PO TABS
1000.0000 mg | ORAL_TABLET | ORAL | Status: AC
  Administered 2019-11-19: 1000 mg via ORAL
  Filled 2019-11-19: qty 2

## 2019-11-19 MED ORDER — PROPOFOL 10 MG/ML IV BOLUS
INTRAVENOUS | Status: AC
  Filled 2019-11-19: qty 20

## 2019-11-19 MED ORDER — MIDAZOLAM HCL 2 MG/2ML IJ SOLN
2.0000 mg | Freq: Once | INTRAMUSCULAR | Status: AC
  Administered 2019-11-19: 2 mg via INTRAVENOUS

## 2019-11-19 MED ORDER — 0.9 % SODIUM CHLORIDE (POUR BTL) OPTIME
TOPICAL | Status: DC | PRN
  Administered 2019-11-19 (×4): 1000 mL

## 2019-11-19 MED ORDER — ACETAMINOPHEN 500 MG PO TABS
1000.0000 mg | ORAL_TABLET | Freq: Four times a day (QID) | ORAL | Status: DC
  Administered 2019-11-19 – 2019-11-25 (×20): 1000 mg via ORAL
  Filled 2019-11-19 (×23): qty 2

## 2019-11-19 MED ORDER — SODIUM CHLORIDE 0.9 % IV SOLN
2.0000 g | INTRAVENOUS | Status: AC
  Administered 2019-11-19: 2 g via INTRAVENOUS

## 2019-11-19 MED ORDER — ONDANSETRON HCL 4 MG PO TABS: 4.0000 mg | ORAL_TABLET | Freq: Four times a day (QID) | ORAL | Status: DC | PRN

## 2019-11-19 MED ORDER — SCOPOLAMINE 1 MG/3DAYS TD PT72
MEDICATED_PATCH | TRANSDERMAL | Status: AC
  Filled 2019-11-19: qty 1

## 2019-11-19 MED ORDER — PROPOFOL 10 MG/ML IV BOLUS
INTRAVENOUS | Status: DC | PRN
  Administered 2019-11-19: 170 mg via INTRAVENOUS

## 2019-11-19 MED ORDER — ONDANSETRON HCL 4 MG/2ML IJ SOLN
INTRAMUSCULAR | Status: DC | PRN
  Administered 2019-11-19: 4 mg via INTRAVENOUS

## 2019-11-19 MED ORDER — ONDANSETRON HCL 4 MG/2ML IJ SOLN
4.0000 mg | Freq: Four times a day (QID) | INTRAMUSCULAR | Status: DC | PRN
  Administered 2019-11-19 – 2019-11-22 (×6): 4 mg via INTRAVENOUS
  Filled 2019-11-19 (×7): qty 2

## 2019-11-19 MED ORDER — GABAPENTIN 300 MG PO CAPS
300.0000 mg | ORAL_CAPSULE | ORAL | Status: AC
  Administered 2019-11-19: 300 mg via ORAL
  Filled 2019-11-19: qty 1

## 2019-11-19 SURGICAL SUPPLY — 40 items
BAG HAMPER (MISCELLANEOUS) ×2 IMPLANT
COVER LIGHT HANDLE STERIS (MISCELLANEOUS) ×8 IMPLANT
COVER WAND RF STERILE (DRAPES) ×2 IMPLANT
DRSG OPSITE POSTOP 4X8 (GAUZE/BANDAGES/DRESSINGS) ×2 IMPLANT
ELECT BLADE 6 FLAT ULTRCLN (ELECTRODE) ×2 IMPLANT
ELECT REM PT RETURN 9FT ADLT (ELECTROSURGICAL) ×2
ELECTRODE REM PT RTRN 9FT ADLT (ELECTROSURGICAL) ×1 IMPLANT
GAUZE 4X4 16PLY RFD (DISPOSABLE) ×2 IMPLANT
GAUZE SPONGE 4X4 12PLY STRL (GAUZE/BANDAGES/DRESSINGS) ×2 IMPLANT
GLOVE BIO SURGEON STRL SZ 6.5 (GLOVE) ×4 IMPLANT
GLOVE BIO SURGEON STRL SZ7 (GLOVE) ×8 IMPLANT
GLOVE BIOGEL PI IND STRL 6.5 (GLOVE) ×2 IMPLANT
GLOVE BIOGEL PI IND STRL 7.0 (GLOVE) ×6 IMPLANT
GLOVE BIOGEL PI INDICATOR 6.5 (GLOVE) ×2
GLOVE BIOGEL PI INDICATOR 7.0 (GLOVE) ×6
GOWN STRL REUS W/TWL LRG LVL3 (GOWN DISPOSABLE) ×12 IMPLANT
INST SET MAJOR GENERAL (KITS) ×2 IMPLANT
KIT TURNOVER KIT A (KITS) ×2 IMPLANT
LIGASURE IMPACT 36 18CM CVD LR (INSTRUMENTS) ×2 IMPLANT
MANIFOLD NEPTUNE II (INSTRUMENTS) ×2 IMPLANT
NEEDLE HYPO 18GX1.5 BLUNT FILL (NEEDLE) ×2 IMPLANT
NEEDLE HYPO 21X1.5 SAFETY (NEEDLE) ×2 IMPLANT
NS IRRIG 1000ML POUR BTL (IV SOLUTION) ×8 IMPLANT
PAD ARMBOARD 7.5X6 YLW CONV (MISCELLANEOUS) ×2 IMPLANT
PENCIL SMOKE EVACUATOR COATED (MISCELLANEOUS) ×2 IMPLANT
RELOAD PROXIMATE 75MM BLUE (ENDOMECHANICALS) ×4 IMPLANT
RETRACTOR WND ALEXIS 25 LRG (MISCELLANEOUS) ×1 IMPLANT
RTRCTR WOUND ALEXIS 25CM LRG (MISCELLANEOUS) ×2
SPONGE LAP 18X18 RF (DISPOSABLE) ×2 IMPLANT
STAPLER GUN LINEAR PROX 60 (STAPLE) ×2 IMPLANT
STAPLER PROXIMATE 75MM BLUE (STAPLE) ×2 IMPLANT
STAPLER VISISTAT (STAPLE) ×2 IMPLANT
SUT PDS AB CT VIOLET #0 27IN (SUTURE) ×4 IMPLANT
SUT SILK 3 0 SH CR/8 (SUTURE) ×4 IMPLANT
SUT VIC AB 3-0 SH 27 (SUTURE) ×2
SUT VIC AB 3-0 SH 27X BRD (SUTURE) ×1 IMPLANT
SYR 20ML LL LF (SYRINGE) ×4 IMPLANT
TRAY COLON PACK (CUSTOM PROCEDURE TRAY) ×2 IMPLANT
TRAY FOLEY MTR SLVR 16FR STAT (SET/KITS/TRAYS/PACK) ×2 IMPLANT
YANKAUER SUCT BULB TIP 10FT TU (MISCELLANEOUS) ×2 IMPLANT

## 2019-11-19 NOTE — Anesthesia Postprocedure Evaluation (Signed)
Anesthesia Post Note  Patient: Danielle Morris  Procedure(s) Performed: PARTIAL COLECTOMY (N/A Abdomen)  Patient location during evaluation: PACU Anesthesia Type: General Level of consciousness: awake and alert and patient cooperative Pain management: satisfactory to patient Vital Signs Assessment: post-procedure vital signs reviewed and stable Respiratory status: spontaneous breathing Cardiovascular status: stable Postop Assessment: no apparent nausea or vomiting Anesthetic complications: no   No complications documented.   Last Vitals:  Vitals:   11/19/19 1315 11/19/19 1425  BP: (!) 144/74 133/89  Pulse: 79 97  Resp: 14 16  Temp:  36.5 C  SpO2: 100% 99%    Last Pain:  Vitals:   11/19/19 1315  TempSrc:   PainSc: Asleep                 Sheriden Archibeque

## 2019-11-19 NOTE — Anesthesia Procedure Notes (Signed)
Procedure Name: Intubation Date/Time: 11/19/2019 10:32 AM Performed by: Vista Deck, CRNA Pre-anesthesia Checklist: Patient identified, Patient being monitored, Timeout performed, Emergency Drugs available and Suction available Patient Re-evaluated:Patient Re-evaluated prior to induction Oxygen Delivery Method: Circle System Utilized Preoxygenation: Pre-oxygenation with 100% oxygen Induction Type: IV induction Laryngoscope Size: Mac and 3 Grade View: Grade I Tube type: Oral Tube size: 7.0 mm Number of attempts: 1 Airway Equipment and Method: stylet and Oral airway Placement Confirmation: ETT inserted through vocal cords under direct vision,  positive ETCO2 and breath sounds checked- equal and bilateral Secured at: 21 cm Tube secured with: Tape Dental Injury: Teeth and Oropharynx as per pre-operative assessment

## 2019-11-19 NOTE — Op Note (Signed)
[facility] Operative Note  11/19/19  Preoperative Diagnosis:  Perforated appendicitis, small bowel fistula from interventional radiology drain to the ileum    Postoperative Diagnosis: Same   Procedure(s) Performed:  Right hemicolectomy    Surgeon: Lanell Matar. Constance Haw, MD   Assistants: No qualified resident was available    Anesthesia: General endotracheal   Anesthesiologist: Denese Killings, MD    Specimens:  Right colon and portion of drain    Estimated Blood Loss: Minimal   Blood Replacement: None    Complications: None   Wound Class: Clean contaminated    Operative Indications:  [name] is a 64 yo who presented a few months back with what is believed to be perforated appendicitis with abscess s/p IR drain and antibiotic treatment. She had a history of multiple polyps and missed a colonoscopy so we also discussed that this could be from something like a cancer. She had evaluations with IR and was found to have a fistula between what is believed to be her ileum and the drain. She has continued to have lower abdominal pain and pain at the glute where the drain was inserted.  We discussed needing to do a right hemicolectomy due to the ileal fistula to the drain and risk of bleeding, infection, anastomotic leak, ureter injury, finding something other than the appendix causing the initial perforation. She opted to proceed. She did a bowel prep, and completed this with some nausea and vomiting.   Findings: Atrophic appendix, like tip was obliterated, fistula between the IR drain and terminal ileum    Procedure: The patient was taken to the operating room and placed supine. General endotracheal anesthesia was induced. Intravenous antibiotics were  administered per protocol.  An orogastric tube positioned to decompress the stomach. The abdomen was prepared and draped in the usual sterile fashion.   Midline incision was made and carried down through to the fascia  with electrocautery. The fascia was entered with care and a wound protector was placed. The small bowel was retracted to the left side, and I found the cecum, a very atrophic appearing appendix at the end of the tinea and the terminal ileum in the pelvis with the IR drain inside. I was able to bluntly get the ileum out of the pelvis enough to get the drain cut.  This freed up the ileum to come to the midline, and the enterotomy from the drain was oversewed with 3-0 Silk suture to prevent contamination.  The right colon was short with a floppy cecum.  The white line of Toldt was taken down and the hepatic flexure was taken down with care.  The proximal point on the ileum was found and transected with a 75 mm linear cutting stapler, and the distal point on the transverse colon was taken with a 75 mm linear cutting stapler. The ureter was identified on the right and protected. The peritoneal reflexion into the pelvis was taken down to help with mobilization of the ileum. The mesentery was taken with a Ligasure high on the mesentery to prevent injury.  The duodneum was noted and protected.   The two ends of bowel were placed without twisting side to side, and a alis clamps were used to align them. Using a 75 mm linear cutting stapler, an antimesenteric side to side anastomosis was done in the standard fashion. The common colotomy was closed with a 60 mm TA stapler. The staple line was oversewn with 3-0 Lembert silk suture. The mesentery was closed  with 3-0 running Vicryl.  Two crotch sutures were placed with 3-0 silk suture.    The omentum was placed over the anastomosis. The abdomen was irrigated with warm saline.  I watched in the pelvis as the remaining portion of the drain was removed by an RN from the patient's glute. The drain has been removed in its entirety.  Hemostasis was confirmed.    The entire team change gowns and gloves and new closing equipment was used and new drapes were placed. The abdomen was  closed in the standard fashion with a running 0 PDS suture. The incision was irrigated and closed with staples. A sterile honeycomb dressing was placed.   Final inspection revealed acceptable hemostasis. All counts were correct at the end of the case. The patient was awakened from anesthesia and extubated without complication.  The patient went to the PACU in stable condition.   Danielle Labrum, MD [facility] [address] [place],  [address] [phone] (office)

## 2019-11-19 NOTE — Progress Notes (Signed)
[facility]  Notified husband that surgery is completed.  Right hemicolectomy done.  Foley, clears, monitor overnight.  Curlene Labrum, MD [facility] [address] [place], [place] [address] [phone] (office)

## 2019-11-19 NOTE — Anesthesia Preprocedure Evaluation (Addendum)
Anesthesia Evaluation  Patient identified by MRN, date of birth, ID band Patient awake    Reviewed: Allergy & Precautions, NPO status , Patient's Chart, lab work & pertinent test results  History of Anesthesia Complications Negative for: history of anesthetic complications  Airway Mallampati: II  TM Distance: >3 FB Neck ROM: Full    Dental  (+) Edentulous Upper, Edentulous Lower   Pulmonary shortness of breath and with exertion, former smoker (quit 09/2019, used to smoke 1 to 2 packs/day),    Pulmonary exam normal breath sounds clear to auscultation- rhonchi (-) decreased breath sounds(-) wheezing (-) stridorstable (-) rales(-) intubated    Cardiovascular Exercise Tolerance: Poor hypertension, Pt. on medications Normal cardiovascular exam Rhythm:Regular Rate:Normal - Systolic murmurs, - Diastolic murmurs, - Friction Rub, - Carotid Bruit, - Peripheral Edema and - Systolic Click IMPRESSIONS    1. Left ventricular ejection fraction, by estimation, is 70 to 75%. The left ventricle has hyperdynamic function. The left ventricle has no regional wall motion abnormalities. Left ventricular diastolic parameters were normal.  2. Right ventricular systolic function is normal. The right ventricular size is normal. There is normal pulmonary artery systolic pressure. The  estimated right ventricular systolic pressure is 56.3 mmHg.  3. The mitral valve is grossly normal. Mild mitral valve regurgitation.  4. The aortic valve has an indeterminant number of cusps and is moderately sclerotic without stenosis. Aortic valve regurgitation is not visualized.  5. The inferior vena cava is normal in size with greater than 50% respiratory variability, suggesting right atrial pressure of 3 mmHg.    Neuro/Psych PSYCHIATRIC DISORDERS Anxiety    GI/Hepatic Neg liver ROS, Bowel prep,Small bowel fistula    Endo/Other  negative endocrine ROS   Renal/GU negative Renal ROS  negative genitourinary   Musculoskeletal negative musculoskeletal ROS (+)   Abdominal   Peds negative pediatric ROS (+)  Hematology negative hematology ROS (+)   Anesthesia Other Findings   Reproductive/Obstetrics                            Anesthesia Physical Anesthesia Plan  ASA: III  Anesthesia Plan: General   Post-op Pain Management:    Induction: Intravenous  PONV Risk Score and Plan: 4 or greater and Ondansetron, Dexamethasone, Midazolam and Scopolamine patch - Pre-op  Airway Management Planned: Oral ETT  Additional Equipment:   Intra-op Plan:   Post-operative Plan: Extubation in OR  Informed Consent: I have reviewed the patients History and Physical, chart, labs and discussed the procedure including the risks, benefits and alternatives for the proposed anesthesia with the patient or authorized representative who has indicated his/her understanding and acceptance.     Dental advisory given  Plan Discussed with: CRNA and Surgeon  Anesthesia Plan Comments:       Anesthesia Quick Evaluation

## 2019-11-19 NOTE — Transfer of Care (Signed)
Immediate Anesthesia Transfer of Care Note  Patient: Danielle Morris  Procedure(s) Performed: PARTIAL COLECTOMY (N/A Abdomen)  Patient Location: PACU  Anesthesia Type:General  Level of Consciousness: awake and patient cooperative  Airway & Oxygen Therapy: Patient Spontanous Breathing  Post-op Assessment: Report given to RN and Post -op Vital signs reviewed and stable  Post vital signs: Reviewed and stable  Last Vitals:  Vitals Value Taken Time  BP    Temp 97.7   Pulse 88 11/19/19 1223  Resp 8 11/19/19 1223  SpO2 99 % 11/19/19 1223  Vitals shown include unvalidated device data.  Last Pain:  Vitals:   11/19/19 0956  TempSrc: Oral  PainSc: 0-No pain      Patients Stated Pain Goal: 4 (86/28/24 1753)  Complications: No complications documented.

## 2019-11-19 NOTE — Interval H&P Note (Signed)
History and Physical Interval Note:  11/19/2019 10:13 AM  Danielle Morris  has presented today for surgery, with the diagnosis of Perforated appendicitis Fistula of intestine.  The various methods of treatment have been discussed with the patient and family. After consideration of risks, benefits and other options for treatment, the patient has consented to  Procedure(s): PARTIAL COLECTOMY (N/A) as a surgical intervention.  The patient's history has been reviewed, patient examined, no change in status, stable for surgery.  I have reviewed the patient's chart and labs.  Questions were answered to the patient's satisfaction.    Bowel prep completed. She did have some nausea and vomiting. The drain still has fecal matter in it. No questions.   Virl Cagey

## 2019-11-19 NOTE — Progress Notes (Signed)
[facility]  Doing fair. Having some soreness. Tolerating some clears.   Curlene Labrum, MD [facility] [address] [place], [place] [address] [phone] (office)

## 2019-11-19 NOTE — Progress Notes (Signed)
From PACU today at 1425.  Alert, honeycomb dressing to abd dry and intact and foley in place. Vitals stable Dressing to right buttock dry, no drainage.  Refused abd binder and ice and used pillow instead.  Husband says when he leaves he will help her put binder on.  Has had little po intake.  Had scheduled tylenol, prn zofran, morphine and oxycodone.

## 2019-11-20 DIAGNOSIS — N179 Acute kidney failure, unspecified: Secondary | ICD-10-CM

## 2019-11-20 LAB — CBC WITH DIFFERENTIAL/PLATELET
Abs Immature Granulocytes: 0.04 10*3/uL (ref 0.00–0.07)
Basophils Absolute: 0 10*3/uL (ref 0.0–0.1)
Basophils Relative: 0 %
Eosinophils Absolute: 0 10*3/uL (ref 0.0–0.5)
Eosinophils Relative: 0 %
HCT: 34.5 % — ABNORMAL LOW (ref 36.0–46.0)
Hemoglobin: 11.3 g/dL — ABNORMAL LOW (ref 12.0–15.0)
Immature Granulocytes: 0 %
Lymphocytes Relative: 12 %
Lymphs Abs: 1.2 10*3/uL (ref 0.7–4.0)
MCH: 30.1 pg (ref 26.0–34.0)
MCHC: 32.8 g/dL (ref 30.0–36.0)
MCV: 91.8 fL (ref 80.0–100.0)
Monocytes Absolute: 0.8 10*3/uL (ref 0.1–1.0)
Monocytes Relative: 8 %
Neutro Abs: 8.1 10*3/uL — ABNORMAL HIGH (ref 1.7–7.7)
Neutrophils Relative %: 80 %
Platelets: 380 10*3/uL (ref 150–400)
RBC: 3.76 MIL/uL — ABNORMAL LOW (ref 3.87–5.11)
RDW: 14.3 % (ref 11.5–15.5)
WBC: 10.2 10*3/uL (ref 4.0–10.5)
nRBC: 0 % (ref 0.0–0.2)

## 2019-11-20 LAB — BASIC METABOLIC PANEL
Anion gap: 15 (ref 5–15)
Anion gap: 13 (ref 5–15)
BUN: 17 mg/dL (ref 8–23)
BUN: 22 mg/dL (ref 8–23)
CO2: 28 mmol/L (ref 22–32)
CO2: 25 mmol/L (ref 22–32)
Calcium: 8.8 mg/dL — ABNORMAL LOW (ref 8.9–10.3)
Calcium: 8.5 mg/dL — ABNORMAL LOW (ref 8.9–10.3)
Chloride: 88 mmol/L — ABNORMAL LOW (ref 98–111)
Chloride: 90 mmol/L — ABNORMAL LOW (ref 98–111)
Creatinine, Ser: 1.5 mg/dL — ABNORMAL HIGH (ref 0.44–1.00)
Creatinine, Ser: 1.72 mg/dL — ABNORMAL HIGH (ref 0.44–1.00)
GFR calc Af Amer: 43 mL/min — ABNORMAL LOW (ref >60)
GFR calc Af Amer: 37 mL/min — ABNORMAL LOW (ref >60)
GFR calc non Af Amer: 37 mL/min — ABNORMAL LOW (ref >60)
GFR calc non Af Amer: 32 mL/min — ABNORMAL LOW (ref >60)
Glucose, Bld: 169 mg/dL — ABNORMAL HIGH (ref 70–99)
Glucose, Bld: 126 mg/dL — ABNORMAL HIGH (ref 70–99)
Potassium: 4.1 mmol/L (ref 3.5–5.1)
Potassium: 3.9 mmol/L (ref 3.5–5.1)
Sodium: 131 mmol/L — ABNORMAL LOW (ref 135–145)
Sodium: 128 mmol/L — ABNORMAL LOW (ref 135–145)

## 2019-11-20 LAB — MAGNESIUM: Magnesium: 1.9 mg/dL (ref 1.7–2.4)

## 2019-11-20 LAB — PHOSPHORUS: Phosphorus: 5.9 mg/dL — ABNORMAL HIGH (ref 2.5–4.6)

## 2019-11-20 MED ORDER — LACTATED RINGERS IV BOLUS
1000.0000 mL | Freq: Once | INTRAVENOUS | Status: AC
  Administered 2019-11-20: 1000 mL via INTRAVENOUS

## 2019-11-20 MED ORDER — HEPARIN SODIUM (PORCINE) 5000 UNIT/ML IJ SOLN
5000.0000 [IU] | Freq: Three times a day (TID) | INTRAMUSCULAR | Status: DC
  Administered 2019-11-21: 5000 [IU] via SUBCUTANEOUS
  Filled 2019-11-20: qty 1

## 2019-11-20 MED ORDER — LACTATED RINGERS IV BOLUS
500.0000 mL | Freq: Once | INTRAVENOUS | Status: AC
  Administered 2019-11-20: 500 mL via INTRAVENOUS

## 2019-11-20 MED ORDER — KETOROLAC TROMETHAMINE 30 MG/ML IJ SOLN
15.0000 mg | Freq: Three times a day (TID) | INTRAMUSCULAR | Status: DC
  Administered 2019-11-20: 15 mg via INTRAVENOUS
  Filled 2019-11-20: qty 1

## 2019-11-20 NOTE — Progress Notes (Signed)
[facility] Progress Note  1 Day Post-Op  Subjective: Foley removed this AM without an order in place. UOP not documented well either, only 40 recorded. Patient has urinated 100 cc dark urine now. Bolus given and repeating labs.   Says she is sore. Ambulating. Tolerating some clears.   Objective: Vital signs in last 24 hours: Temp:  [98 F (36.7 C)-98.2 F (36.8 C)] 98 F (36.7 C) (06/12 0435) Pulse Rate:  [80-91] 80 (06/12 0437) Resp:  [16] 16 (06/12 0026) BP: (79-106)/(58-77) 95/69 (06/12 0437) SpO2:  [96 %-100 %] 97 % (06/12 0437) Weight:  [60.3 kg] 60.3 kg (06/12 0500) Last BM Date: 11/18/19  Intake/Output from previous day: 06/11 0701 - 06/12 0700 In: 2367.8 [I.V.:2167.8; IV Piggyback:200] Out: 70 [Urine:40; Blood:30] Intake/Output this shift: Total I/O In: 240 [P.O.:240] Out: 100 [Urine:100]  General appearance: alert, cooperative and no distress Resp: normal work of breathing GI: soft, nondistended, appropriately tender, honeycomb in place without drainage or erythema   Lab Results:  Recent Labs    11/19/19 0942 11/20/19 0705  WBC  --  10.2  HGB 15.6* 11.3*  HCT 46.0 34.5*  PLT  --  380   BMET Recent Labs    11/19/19 0942 11/20/19 0705  NA 132* 131*  K 3.9 4.1  CL 92* 88*  CO2  --  28  GLUCOSE 123* 169*  BUN 5* 17  CREATININE 0.70 1.50*  CALCIUM  --  8.8*   PT/INR No results for input(s): LABPROT, INR in the last 72 hours.  Studies/Results: No results found.  Anti-infectives: Anti-infectives (From admission, onward)   Start     Dose/Rate Route Frequency Ordered Stop   11/19/19 2200  cefoTEtan (CEFOTAN) 2 g in sodium chloride 0.9 % 100 mL IVPB     Discontinue     2 g 200 mL/hr over 30 Minutes Intravenous Every 12 hours 11/19/19 1440 11/21/19 0959   11/19/19 0945  cefoTEtan (CEFOTAN) 2 g in sodium chloride 0.9 % 100 mL IVPB        2 g 200 mL/hr over 30 Minutes Intravenous On call to O.R. 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 Doing fair. But UOP is low and foley removed this AM without order in place.   -ERAS for pain, stopping toradol given the Cr increase  - IS, OOB - Off monitor - Clear diet, awaiting bowel function  - UOP down and bolus given, will likely have to replace the foley, looks dark on report, AKI post op likely from dehydration, will monitor, 500cc bolus and increased LR to 75 from 50, may need more bolus too  - Labs at 1400 and labs in AM - SCD, stopping lovenox and changing to heparin sq    LOS: 1 day    Virl Cagey 11/20/2019

## 2019-11-20 NOTE — Progress Notes (Signed)
Patient walked outside her room and about 1/4 way down hall 3 times with her husband. She stated that she felt sooooo much better. She also walked to the bathroom and put out 100 mls of dark urine

## 2019-11-20 NOTE — Plan of Care (Signed)

## 2019-11-20 NOTE — Progress Notes (Signed)
[facility]  Cr worsening. Will have to replace foley and monitor urine strictly. LR bolus again.   Curlene Labrum, MD [facility] [address] [place], [place] [address] [phone] (office)

## 2019-11-21 DIAGNOSIS — D62 Acute posthemorrhagic anemia: Secondary | ICD-10-CM

## 2019-11-21 LAB — CBC WITH DIFFERENTIAL/PLATELET
Abs Immature Granulocytes: 0.02 10*3/uL (ref 0.00–0.07)
Basophils Absolute: 0 10*3/uL (ref 0.0–0.1)
Basophils Relative: 1 %
Eosinophils Absolute: 0.1 10*3/uL (ref 0.0–0.5)
Eosinophils Relative: 1 %
HCT: 23.9 % — ABNORMAL LOW (ref 36.0–46.0)
Hemoglobin: 7.8 g/dL — ABNORMAL LOW (ref 12.0–15.0)
Immature Granulocytes: 0 %
Lymphocytes Relative: 17 %
Lymphs Abs: 1.1 10*3/uL (ref 0.7–4.0)
MCH: 30.5 pg (ref 26.0–34.0)
MCHC: 32.6 g/dL (ref 30.0–36.0)
MCV: 93.4 fL (ref 80.0–100.0)
Monocytes Absolute: 0.3 10*3/uL (ref 0.1–1.0)
Monocytes Relative: 5 %
Neutro Abs: 4.9 10*3/uL (ref 1.7–7.7)
Neutrophils Relative %: 76 %
Platelets: 178 10*3/uL (ref 150–400)
RBC: 2.56 MIL/uL — ABNORMAL LOW (ref 3.87–5.11)
RDW: 14.4 % (ref 11.5–15.5)
WBC: 6.3 10*3/uL (ref 4.0–10.5)
nRBC: 0 % (ref 0.0–0.2)

## 2019-11-21 LAB — MAGNESIUM: Magnesium: 2 mg/dL (ref 1.7–2.4)

## 2019-11-21 LAB — HEMOGLOBIN AND HEMATOCRIT, BLOOD
HCT: 26.9 % — ABNORMAL LOW (ref 36.0–46.0)
Hemoglobin: 8.8 g/dL — ABNORMAL LOW (ref 12.0–15.0)

## 2019-11-21 LAB — BASIC METABOLIC PANEL
Anion gap: 6 (ref 5–15)
BUN: 15 mg/dL (ref 8–23)
CO2: 30 mmol/L (ref 22–32)
Calcium: 8.2 mg/dL — ABNORMAL LOW (ref 8.9–10.3)
Chloride: 94 mmol/L — ABNORMAL LOW (ref 98–111)
Creatinine, Ser: 0.95 mg/dL (ref 0.44–1.00)
GFR calc Af Amer: >60 mL/min (ref >60)
GFR calc non Af Amer: >60 mL/min (ref >60)
Glucose, Bld: 112 mg/dL — ABNORMAL HIGH (ref 70–99)
Potassium: 4.6 mmol/L (ref 3.5–5.1)
Sodium: 130 mmol/L — ABNORMAL LOW (ref 135–145)

## 2019-11-21 LAB — PHOSPHORUS: Phosphorus: 2.3 mg/dL — ABNORMAL LOW (ref 2.5–4.6)

## 2019-11-21 MED ORDER — CHLORHEXIDINE GLUCONATE CLOTH 2 % EX PADS
6.0000 | MEDICATED_PAD | Freq: Every day | CUTANEOUS | Status: DC
  Administered 2019-11-21 – 2019-11-25 (×5): 6 via TOPICAL

## 2019-11-21 MED ORDER — HEPARIN SODIUM (PORCINE) 5000 UNIT/ML IJ SOLN
5000.0000 [IU] | Freq: Three times a day (TID) | INTRAMUSCULAR | Status: DC
  Administered 2019-11-22 – 2019-11-25 (×8): 5000 [IU] via SUBCUTANEOUS
  Filled 2019-11-21 (×9): qty 1

## 2019-11-21 NOTE — Plan of Care (Signed)
  Problem: Clinical Measurements: Goal: Will remain free from infection Outcome: Progressing Goal: Diagnostic test results will improve Outcome: Progressing   Problem: Activity: Goal: Risk for activity intolerance will decrease Outcome: Progressing   Problem: Nutrition: Goal: Adequate nutrition will be maintained Outcome: Progressing

## 2019-11-21 NOTE — Progress Notes (Signed)
[facility] Progress Note  2 Days Post-Op  Subjective: UOP improved and creatinine improved. H&H down from fluids and also some blood loss.  Repeat H&H improved. No nausea but no bowel function. Taking in minimal clears.   Objective: Vital signs in last 24 hours: Temp:  [98.7 F (37.1 C)-99.1 F (37.3 C)] 98.7 F (37.1 C) (06/13 0500) Pulse Rate:  [94-96] 94 (06/13 1514) Resp:  [18-20] 18 (06/13 1514) BP: (112-138)/(62-75) 138/75 (06/13 1514) SpO2:  [94 %-97 %] 97 % (06/13 1514) Weight:  [60 kg] 60 kg (06/13 0500) Last BM Date: 11/18/19  Intake/Output from previous day: 06/12 0701 - 06/13 0700 In: 2517.5 [P.O.:480; I.V.:1937.5; IV Piggyback:100] Out: 1390 [Urine:1390] Intake/Output this shift: Total I/O In: -  Out: 600 [Urine:600]  General appearance: alert, cooperative and no distress Resp: normal work of breathing GI: soft, nondistended, appropriately tender, dressing with minor dry blood, no erythema or drainage  Buttock dressing on right removed- no erythema or drainage  Lab Results:  Recent Labs    11/20/19 0705 11/20/19 0705 11/21/19 0705 11/21/19 1407  WBC 10.2  --  6.3  --   HGB 11.3*   < > 7.8* 8.8*  HCT 34.5*   < > 23.9* 26.9*  PLT 380  --  178  --    < > = values in this interval not displayed.   BMET Recent Labs    11/20/19 1445 11/21/19 0705  NA 128* 130*  K 3.9 4.6  CL 90* 94*  CO2 25 30  GLUCOSE 126* 112*  BUN 22 15  CREATININE 1.72* 0.95  CALCIUM 8.5* 8.2*    Anti-infectives: Anti-infectives (From admission, onward)   Start     Dose/Rate Route Frequency Ordered Stop   11/19/19 2200  cefoTEtan (CEFOTAN) 2 g in sodium chloride 0.9 % 100 mL IVPB        2 g 200 mL/hr over 30 Minutes Intravenous Every 12 hours 11/19/19 1440 11/20/19 2208   11/19/19 0945  cefoTEtan (CEFOTAN) 2 g in sodium chloride 0.9 % 100 mL IVPB        2 g 200 mL/hr over 30 Minutes Intravenous On call to O.R. 11/19/19 0942 11/19/19 1100       Assessment/Plan: Ms. 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 H&H drifting but stabilizing from dilution and some blood loss.   -ERAS for pain - IS, OOB - Full liquid diet, awaiting bowel function  - UOP better and Cr better, leaving foley until tomorrow - H&H down, post op anemia from blood loss but also dilution, H&H 8.8 from AM of 7.8 so no active signs of bleeding - AM labs  - SCD, holding anticoagulation today due to anemia / blood loss    LOS: 2 days    Virl Cagey 11/21/2019

## 2019-11-22 ENCOUNTER — Encounter (HOSPITAL_COMMUNITY): Payer: Self-pay | Admitting: General Surgery

## 2019-11-22 ENCOUNTER — Inpatient Hospital Stay (HOSPITAL_COMMUNITY): Payer: BC Managed Care – PPO

## 2019-11-22 DIAGNOSIS — I10 Essential (primary) hypertension: Secondary | ICD-10-CM

## 2019-11-22 DIAGNOSIS — R9430 Abnormal result of cardiovascular function study, unspecified: Secondary | ICD-10-CM

## 2019-11-22 DIAGNOSIS — R079 Chest pain, unspecified: Secondary | ICD-10-CM

## 2019-11-22 DIAGNOSIS — T8183XA Persistent postprocedural fistula, initial encounter: Secondary | ICD-10-CM | POA: Diagnosis not present

## 2019-11-22 DIAGNOSIS — R918 Other nonspecific abnormal finding of lung field: Secondary | ICD-10-CM | POA: Diagnosis not present

## 2019-11-22 DIAGNOSIS — E785 Hyperlipidemia, unspecified: Secondary | ICD-10-CM

## 2019-11-22 DIAGNOSIS — R778 Other specified abnormalities of plasma proteins: Secondary | ICD-10-CM

## 2019-11-22 DIAGNOSIS — N179 Acute kidney failure, unspecified: Secondary | ICD-10-CM | POA: Diagnosis not present

## 2019-11-22 DIAGNOSIS — K3532 Acute appendicitis with perforation and localized peritonitis, without abscess: Secondary | ICD-10-CM | POA: Diagnosis not present

## 2019-11-22 DIAGNOSIS — K8020 Calculus of gallbladder without cholecystitis without obstruction: Secondary | ICD-10-CM | POA: Diagnosis not present

## 2019-11-22 DIAGNOSIS — R1013 Epigastric pain: Secondary | ICD-10-CM | POA: Diagnosis not present

## 2019-11-22 DIAGNOSIS — D62 Acute posthemorrhagic anemia: Secondary | ICD-10-CM | POA: Diagnosis not present

## 2019-11-22 LAB — ECHOCARDIOGRAM COMPLETE
Height: 67 in
Weight: 2116.42 oz

## 2019-11-22 LAB — CBC WITH DIFFERENTIAL/PLATELET
Abs Immature Granulocytes: 0.06 10*3/uL (ref 0.00–0.07)
Basophils Absolute: 0 10*3/uL (ref 0.0–0.1)
Basophils Relative: 0 %
Eosinophils Absolute: 0 10*3/uL (ref 0.0–0.5)
Eosinophils Relative: 0 %
HCT: 29.3 % — ABNORMAL LOW (ref 36.0–46.0)
Hemoglobin: 9.8 g/dL — ABNORMAL LOW (ref 12.0–15.0)
Immature Granulocytes: 1 %
Lymphocytes Relative: 5 %
Lymphs Abs: 0.5 10*3/uL — ABNORMAL LOW (ref 0.7–4.0)
MCH: 30.5 pg (ref 26.0–34.0)
MCHC: 33.4 g/dL (ref 30.0–36.0)
MCV: 91.3 fL (ref 80.0–100.0)
Monocytes Absolute: 0.4 10*3/uL (ref 0.1–1.0)
Monocytes Relative: 3 %
Neutro Abs: 9.3 10*3/uL — ABNORMAL HIGH (ref 1.7–7.7)
Neutrophils Relative %: 91 %
Platelets: 305 10*3/uL (ref 150–400)
RBC: 3.21 MIL/uL — ABNORMAL LOW (ref 3.87–5.11)
RDW: 14.3 % (ref 11.5–15.5)
WBC: 10.3 10*3/uL (ref 4.0–10.5)
nRBC: 0 % (ref 0.0–0.2)

## 2019-11-22 LAB — COMPREHENSIVE METABOLIC PANEL
ALT: 14 U/L (ref 0–44)
AST: 19 U/L (ref 15–41)
Albumin: 3.4 g/dL — ABNORMAL LOW (ref 3.5–5.0)
Alkaline Phosphatase: 50 U/L (ref 38–126)
Anion gap: 10 (ref 5–15)
BUN: 14 mg/dL (ref 8–23)
CO2: 26 mmol/L (ref 22–32)
Calcium: 8.2 mg/dL — ABNORMAL LOW (ref 8.9–10.3)
Chloride: 90 mmol/L — ABNORMAL LOW (ref 98–111)
Creatinine, Ser: 0.73 mg/dL (ref 0.44–1.00)
GFR calc Af Amer: >60 mL/min (ref >60)
GFR calc non Af Amer: >60 mL/min (ref >60)
Glucose, Bld: 147 mg/dL — ABNORMAL HIGH (ref 70–99)
Potassium: 3.6 mmol/L (ref 3.5–5.1)
Sodium: 126 mmol/L — ABNORMAL LOW (ref 135–145)
Total Bilirubin: 0.5 mg/dL (ref 0.3–1.2)
Total Protein: 7 g/dL (ref 6.5–8.1)

## 2019-11-22 LAB — LIPID PANEL
Cholesterol: 135 mg/dL (ref 0–200)
HDL: 39 mg/dL — ABNORMAL LOW (ref >40)
LDL Cholesterol: 75 mg/dL (ref 0–99)
Total CHOL/HDL Ratio: 3.5 RATIO
Triglycerides: 105 mg/dL (ref <150)
VLDL: 21 mg/dL (ref 0–40)

## 2019-11-22 LAB — BASIC METABOLIC PANEL
Anion gap: 9 (ref 5–15)
BUN: 14 mg/dL (ref 8–23)
CO2: 31 mmol/L (ref 22–32)
Calcium: 8.8 mg/dL — ABNORMAL LOW (ref 8.9–10.3)
Chloride: 92 mmol/L — ABNORMAL LOW (ref 98–111)
Creatinine, Ser: 0.82 mg/dL (ref 0.44–1.00)
GFR calc Af Amer: >60 mL/min (ref >60)
GFR calc non Af Amer: >60 mL/min (ref >60)
Glucose, Bld: 174 mg/dL — ABNORMAL HIGH (ref 70–99)
Potassium: 4.8 mmol/L (ref 3.5–5.1)
Sodium: 132 mmol/L — ABNORMAL LOW (ref 135–145)

## 2019-11-22 LAB — TROPONIN I (HIGH SENSITIVITY)
Troponin I (High Sensitivity): 94 ng/L — ABNORMAL HIGH (ref <18)
Troponin I (High Sensitivity): 107 ng/L (ref <18)
Troponin I (High Sensitivity): 104 ng/L (ref <18)
Troponin I (High Sensitivity): 81 ng/L — ABNORMAL HIGH (ref <18)

## 2019-11-22 LAB — SURGICAL PATHOLOGY

## 2019-11-22 MED ORDER — CALCIUM CARBONATE ANTACID 500 MG PO CHEW: 1.0000 | CHEWABLE_TABLET | Freq: Three times a day (TID) | ORAL | Status: DC | PRN

## 2019-11-22 MED ORDER — PROMETHAZINE HCL 25 MG/ML IJ SOLN
6.2500 mg | Freq: Four times a day (QID) | INTRAMUSCULAR | Status: DC | PRN
  Administered 2019-11-22: 6.25 mg via INTRAVENOUS
  Filled 2019-11-22: qty 1

## 2019-11-22 MED ORDER — LACTATED RINGERS IV SOLN: INTRAVENOUS | Status: DC

## 2019-11-22 MED ORDER — BISMUTH SUBSALICYLATE 262 MG/15ML PO SUSP
30.0000 mL | ORAL | Status: DC | PRN
  Administered 2019-11-22 – 2019-11-24 (×7): 30 mL via ORAL
  Filled 2019-11-22: qty 118

## 2019-11-22 MED ORDER — IOHEXOL 9 MG/ML PO SOLN
ORAL | Status: AC
  Filled 2019-11-22: qty 1000

## 2019-11-22 MED ORDER — METOPROLOL TARTRATE 5 MG/5ML IV SOLN
5.0000 mg | Freq: Four times a day (QID) | INTRAVENOUS | Status: DC
  Administered 2019-11-22 – 2019-11-24 (×7): 5 mg via INTRAVENOUS
  Filled 2019-11-22 (×7): qty 5

## 2019-11-22 MED ORDER — IOHEXOL 300 MG/ML  SOLN
100.0000 mL | Freq: Once | INTRAMUSCULAR | Status: AC | PRN
  Administered 2019-11-22: 100 mL via INTRAVENOUS

## 2019-11-22 MED ORDER — LACTATED RINGERS IV BOLUS
500.0000 mL | Freq: Once | INTRAVENOUS | Status: AC
  Administered 2019-11-22: 500 mL via INTRAVENOUS

## 2019-11-22 MED ORDER — PANTOPRAZOLE SODIUM 40 MG PO TBEC
40.0000 mg | DELAYED_RELEASE_TABLET | Freq: Every day | ORAL | Status: DC
  Administered 2019-11-22 – 2019-11-25 (×4): 40 mg via ORAL
  Filled 2019-11-22 (×4): qty 1

## 2019-11-22 NOTE — Progress Notes (Signed)
*  PRELIMINARY RESULTS* Echocardiogram 2D Echocardiogram has been performed.  Danielle Morris 11/22/2019, 12:09 PM

## 2019-11-22 NOTE — Progress Notes (Signed)
Pt in much pain throughout night in abdomen and chest minimally relieved by medications (see MAR) and heat packs. Pt refused mobility, had massive liquid brown bowel movement around 0047 in bed and on BSC and felt like she was "going to pass out." Pt's BP this morning= 175/101; IV metoprolol given per MAR, BP came down to 153/82. Pt c/o "having a heart attack, pain is radiating to my back, it feels like an elephant is sitting on my chest." EKG= NSR. Urine output for night approximately 59ml/hr, MD notified.

## 2019-11-22 NOTE — Progress Notes (Signed)
CRITICAL VALUE ALERT  Critical Value:  Troponin 104  Date & Time Notied:  11/22/2019 1218  Provider Notified: Dr. Curlene Labrum Dr. Dorris Carnes  Orders Received/Actions taken:  Orders received for another EKG, patient had echocardiogram completed

## 2019-11-22 NOTE — Progress Notes (Signed)
CRITICAL VALUE ALERT  Critical Value:  Troponin 107  Date & Time Notied:  11/22/2019 0950  Provider Notified: Dr. Curlene Labrum   Orders Received/Actions taken: Dr. Constance Haw in room to see patient, will get cardiology to see patient

## 2019-11-22 NOTE — Progress Notes (Signed)
X-ray abdomen with more air than radiology would think normal. NPO and Ct ordered stat.    Curlene Labrum, MD

## 2019-11-22 NOTE — Consult Note (Addendum)
Cardiology Consultation:   Patient ID: Danielle Morris MRN: [id number]; DOB: [birth year]  Admit date: 11/19/2019 Date of Consult: 11/22/2019  Primary Care Provider: Susy Frizzle, MD [facility] Cardiologist: No primary care provider on file. new [facility] Electrophysiologist:  None     Patient Profile:   Danielle Morris is a 64 y.o. female with a hx of perforated appendicitis with abcess s/p IR drain and now s/p right hemicolectomy who is being seen today for the evaluation of chest pain at the request of Danielle Morris.  History of Present Illness:   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 EKG new inf Q waves and TWI II, III, avf, troponin 94, 107. Patient says she ate cream of chicken soup and laid down. Developed severe chest pressure into her shoulders, nausea, dry heaving-thought it was indigestion. Father died MI 71, she quit smoking 2 months ago->25 pk years, HTN, HLD-quit statin 2 months ago. Pain free now.   Past Medical History:  Diagnosis Date   GAD (generalized anxiety disorder)    HLD (hyperlipidemia)    Hypertension    Panic attack    Tobacco abuse     Past Surgical History:  Procedure Laterality Date   COLONOSCOPY WITH PROPOFOL N/A 11/02/2013   Procedure: COLONOSCOPY WITH PROPOFOL;  Surgeon: Danielle Binder, MD;  Location: [facility];  Service: Endoscopy;  Laterality: N/A;  in cecum @ 1002; cecal withdrawal time- minutes   HEMORRHOID BANDING N/A 11/02/2013   Procedure: HEMORRHOID BANDING;  Surgeon: Danielle Binder, MD;  Location: [facility];  Service: Endoscopy;  Laterality: N/A;   HEMORRHOID SURGERY N/A 11/05/2013   Procedure: EXTENSIVE HEMORRHOIDECTOMY ;  Surgeon: Danielle So, MD;  Location: [facility];  Service: General;  Laterality: N/A;   IR RADIOLOGIST EVAL & MGMT   10/05/2019   IR RADIOLOGIST EVAL & MGMT  10/19/2019   IR RADIOLOGIST EVAL & MGMT  11/10/2019   PARTIAL COLECTOMY N/A 11/19/2019   Procedure: PARTIAL COLECTOMY;  Surgeon: Danielle Cagey, MD;  Location: [facility];  Service: General;  Laterality: N/A;   POLYPECTOMY N/A 11/02/2013   Procedure: POLYPECTOMY;  Surgeon: Danielle Binder, MD;  Location: [facility];  Service: Endoscopy;  Laterality: N/A;  sigmoid colon, hepatic flexure, hyperplastic rectal, and rectal polyps   TONSILLECTOMY     age  38     Home Medications:  Prior to Admission medications   Medication Sig Start Date End Date Taking? Authorizing Provider  acetaminophen (TYLENOL) 500 MG tablet Take 500 mg by mouth every 6 (six) hours as needed for moderate pain or headache.   Yes [provider]  cyclobenzaprine (FLEXERIL) 5 MG tablet Take 1 tablet (5 mg total) by mouth 3 (three) times daily as needed for muscle spasms. 11/05/19  Yes Danielle Cagey, MD  Dibucaine (NUPERCAINAL RE) Place 1 application rectally daily as needed (pain).   Yes [provider]  docusate calcium (SURFAK) 240 MG capsule Take 240 mg by mouth at bedtime.   Yes [provider]  docusate sodium (STOOL SOFTENER) 100 MG capsule Take 100 mg by mouth daily.   Yes [provider]  doxylamine, Sleep, (UNISOM) 25 MG tablet Take 25 mg by mouth at bedtime as needed for sleep.   Yes [provider]  losartan (COZAAR) 100 MG tablet TAKE 1 TABLET(100 MG) BY MOUTH DAILY Patient taking differently: Take 100 mg by mouth daily.  09/27/19  Yes Danielle Frizzle, MD  ondansetron (ZOFRAN ODT) 8 MG disintegrating tablet Take 1 tablet (8 mg total) by mouth every 8 (eight) hours as needed for nausea or vomiting. 09/24/19  Yes Barton Dubois, MD  oxycodone (OXY-IR) 5 MG capsule Take 1 capsule (5 mg total) by mouth every 4 (four) hours as needed for pain. 11/12/19  Yes Danielle Cagey, MD  sertraline (ZOLOFT) 50 MG tablet TAKE 1 TABLET(50 MG) BY  MOUTH DAILY Patient not taking: Reported on 11/12/2019 09/27/19   Danielle Frizzle, MD    Inpatient Medications: Scheduled Meds:  acetaminophen  1,000 mg Oral Q6H   Chlorhexidine Gluconate Cloth  6 each Topical Daily   feeding supplement  237 mL Oral BID BM   gabapentin  300 mg Oral BID   heparin injection (subcutaneous)  5,000 Units Subcutaneous Q8H   pantoprazole  40 mg Oral Daily   Continuous Infusions:  PRN Meds: cyclobenzaprine, diphenhydrAMINE **OR** diphenhydrAMINE, metoprolol tartrate, morphine injection, ondansetron **OR** ondansetron (ZOFRAN) IV, oxyCODONE, simethicone  Allergies:    Allergies  Allergen Reactions   Amlodipine Shortness Of Breath, Anxiety and Palpitations   Tramadol     Rushes thru body, miserable, shaking and crying   Hydrocodone Nausea And Vomiting   Lisinopril Other (See Comments)    unknown   Chantix [Varenicline] Nausea Only and Anxiety    Social History:   Social History   Socioeconomic History   Marital status: Married    Spouse name: Not on file   Number of children: Not on file   Years of education: Not on file   Highest education level: Not on file  Occupational History   Not on file  Tobacco Use   Smoking status: Former Smoker    Packs/day: 0.50    Years: 10.00    Pack years: 5.00    Types: Cigarettes    Quit date: 09/09/2019    Years since quitting: 0.2   Smokeless tobacco: Never Used   Tobacco comment: uses E-cigs  Substance and Sexual Activity   Alcohol use: No   Drug use: No   Sexual activity: Not on file  Other Topics Concern   Not on file  Social History Narrative   Entered 03/2014:   Married.    2 Daughters and son in law, and 1 grandchild live with them now.    Keeps another grandchild frequently also.   Does not work outside of house.   Social Determinants of Health   Financial Resource Strain:    Difficulty of Paying Living Expenses:   Food Insecurity:    Worried About Ship broker in the Last Year:    Arboriculturist in the Last Year:   Transportation Needs:    Film/video editor (Medical):    Lack of Transportation (Non-Medical):   Physical Activity:    Days of Exercise per Week:    Minutes of Exercise per Session:   Stress:    Feeling of Stress :   Social Connections:    Frequency of Communication with Friends and Family:    Frequency of Social Gatherings with Friends and Family:    Attends Religious Services:    Active Member of Clubs or Organizations:    Attends Archivist Meetings:    Marital Status:   Intimate Partner Violence:    Fear  of Current or Ex-Partner:    Emotionally Abused:    Physically Abused:    Sexually Abused:     Family History:     Family History  Problem Relation Age of Onset   Heart disease Father 67       MI--H/O Rheumatic Fever as a child   Hypertension Brother    Hypertension Brother    Diabetes Other      ROS:  Please see the history of present illness.  Review of Systems  Constitutional: Negative.  HENT: Negative.   Eyes: Negative.   Cardiovascular: Positive for chest pain.  Respiratory: Negative.   Hematologic/Lymphatic: Negative.   Musculoskeletal: Negative.  Negative for joint pain.  Gastrointestinal: Positive for abdominal pain.  Genitourinary: Negative.   Neurological: Negative.     All other ROS reviewed and negative.     Physical Exam/Data:   Vitals:   11/21/19 2247 11/22/19 0500 11/22/19 0614 11/22/19 0647  BP: (!) 138/92  (!) 175/101 (!) 153/82  Pulse: (!) 108  90 83  Resp: 20  20   Temp: 99 F (37.2 C)  98.6 F (37 C)   TempSrc: Oral  Oral   SpO2: 95%  98%   Weight:  60 kg    Height:        Intake/Output Summary (Last 24 hours) at 11/22/2019 1157 Last data filed at 11/22/2019 0600 Gross per 24 hour  Intake 480 ml  Output 2500 ml  Net -2020 ml   Net  +1.6 L  Last 3 Weights 11/22/2019 11/21/2019 11/20/2019  Weight (lbs) 132 lb 4.4 oz 132 lb  4.4 oz 132 lb 15 oz  Weight (kg) 60 kg 60 kg 60.3 kg     Body mass index is 20.72 kg/m.  General:  Well nourished, well developed, in no acute distress HEENT: normal Lymph: no adenopathy Neck: no JVD Endocrine:  No thryomegaly Vascular: No carotid bruits; FA pulses 2+ bilaterally without bruits  Cardiac:  normal S1, S2; RRR; no murmur  Lungs:  clear to auscultation bilaterally, no wheezing, rhonchi or rales  Abd: abdominal incision,soft, nontender, no hepatomegaly  Ext: no edema Musculoskeletal:  No deformities, BUE and BLE strength normal and equal Skin: warm and dry  Neuro:  CNs 2-12 intact, no focal abnormalities noted Psych:  Normal affect   EKG:  The EKG was personally reviewed and demonstrates:  NSR with inf Q waves, TWI  Telemetry:  Telemetry was personally reviewed and demonstrates: NSR  Relevant CV Studies: Echo 09/21/19 IMPRESSIONS     1. Left ventricular ejection fraction, by estimation, is 70 to 75%. The  left ventricle has hyperdynamic function. The left ventricle has no  regional wall motion abnormalities. Left ventricular diastolic parameters  were normal.   2. Right ventricular systolic function is normal. The right ventricular  size is normal. There is normal pulmonary artery systolic pressure. The  estimated right ventricular systolic pressure is 41.7 mmHg.   3. The mitral valve is grossly normal. Mild mitral valve regurgitation.   4. The aortic valve has an indeterminant number of cusps and is  moderately sclerotic without stenosis. Aortic valve regurgitation is not  visualized.   5. The inferior vena cava is normal in size with greater than 50%  respiratory variability, suggesting right atrial pressure of 3 mmHg.    Laboratory Data:  High Sensitivity Troponin:   Recent Labs  Lab 11/22/19 0555 11/22/19 0845  TROPONINIHS 94* 107*     Chemistry Recent Labs  Lab 11/20/19  1445 11/21/19 0705 11/22/19 0555  NA 128* 130* 132*  K 3.9 4.6 4.8  CL  90* 94* 92*  CO2 25 30 31   GLUCOSE 126* 112* 174*  BUN 22 15 14   CREATININE 1.72* 0.95 0.82  CALCIUM 8.5* 8.2* 8.8*  GFRNONAA 32* >60 >60  GFRAA 37* >60 >60  ANIONGAP 13 6 9     No results for input(s): PROT, ALBUMIN, AST, ALT, ALKPHOS, BILITOT in the last 168 hours. Hematology Recent Labs  Lab 11/20/19 0705 11/20/19 0705 11/21/19 0705 11/21/19 1407 11/22/19 0555  WBC 10.2  --  6.3  --  10.3  RBC 3.76*  --  2.56*  --  3.21*  HGB 11.3*   < > 7.8* 8.8* 9.8*  HCT 34.5*   < > 23.9* 26.9* 29.3*  MCV 91.8  --  93.4  --  91.3  MCH 30.1  --  30.5  --  30.5  MCHC 32.8  --  32.6  --  33.4  RDW 14.3  --  14.4  --  14.3  PLT 380  --  178  --  305   < > = values in this interval not displayed.   BNPNo results for input(s): BNP, PROBNP in the last 168 hours.  DDimer No results for input(s): DDIMER in the last 168 hours.   Radiology/Studies:  DG Chest Port 1 View  Addendum Date: 11/22/2019   ADDENDUM REPORT: 11/22/2019 10:42 ADDENDUM: Patient reportedly had an exploratory laparotomy on Friday. Free air could certainly be seen following this procedure. If there are changes in symptoms or concern for acute abdominal process abdominal imaging either with radiography or CT could be considered. These results were called by telephone at the time of interpretation on 11/22/2019 at 10:42 am to provider [facility] , who verbally acknowledged these results. Electronically Signed   By: Zetta Bills M.D.   On: 11/22/2019 10:42   Result Date: 11/22/2019 CLINICAL DATA:  Chest pain, COVID negative on 06/08 2021 EXAM: PORTABLE CHEST 1 VIEW COMPARISON:  CT abdomen and pelvis 11/10/2019 FINDINGS: Cardiomediastinal contours partially obscured on the RIGHT and at the LEFT lung base. Patchy opacities at the lung bases more pronounced on the RIGHT along the medial aspect of the RIGHT hemidiaphragm and RIGHT heart border. No sign of effusion on [facility] portable radiograph. On limited assessment visualized skeletal  structures without acute bone finding. In the LEFT upper quadrant there is a gas collection likely within the stomach but appears slightly more lateral than expected. Gas-filled loops of bowel are also seen in the upper abdomen. IMPRESSION: 1. Bibasilar opacities more pronounced on the RIGHT. Atelectasis versus is developing pneumonia. 2. Gas collection likely within the stomach but appears slightly more lateral than expected. Correlate with any abdominal symptoms and correlation with supine and upright abdominal radiographs, to assess bowel gas and exclude the possibility of a small amount of pneumoperitoneum. Electronically Signed: By: Zetta Bills M.D. On: 11/22/2019 08:19       TIMI Risk Score for Unstable Angina or Non-ST Elevation MI:   The patient's TIMI risk score is 4, which indicates a 20% risk of all cause mortality, new or recurrent myocardial infarction or need for urgent revascularization in the next 14 days.    Assessment and Plan:   Elevated troponin. Pt POD 2 frim hemicolectomy   Last night had CP  On exam pain is epigastric/ subcostal   Labs signif for Trop elevation (94, 107. 104) EKG unchanged from preop   Q  waves III, AVF with T wave inversion  Echo report:  Normal LVEF on echo 09/2019. Will check limited echo and discuss cath with MD  HTN on losartan  HLD-was on atorvastatin but stopped 2 months ago  Family history of early CAD-father died MI 62  Tobacco abuse quit smoking 2 months ago but > 25 pk years.      For questions or updates, please contact [place] Please consult [email or web address] for contact info under    Signed, Ermalinda Barrios, PA-C  11/22/2019 11:57 AM   Pt seen and examined   I have reviewed consult note by Gerrianne Scale above and modified to reflect findings    Pt is a 64 yo with no known CAD   Admitted on 6/09 for exploratory surgery after perforated appendicitis.   The pt had surgery on 6/11 (R hemicolectomy) Last night she developed chest and  abdominal pain.  BP severely elevated    Occurred after eating when she laid down   When seen earlier by by Gerrianne Scale the pt was comfortable without pain.   Now, pain has returned   Epigastric  ON exam, patent appears uncomfortable  Says she is nauseated  Neck:  JVP is normal Lungs are CTA Cardiac RRR  No S3   Chest:  Tender in subcostal/epigastric region with guarding Abd:  Soft   Decreased BS   Upper abd tender Ext :  Legs warm  2+ pulses   No edema  I have reviewed echo just after it was finished OVerall  LVEF is probalby normal normal but there is akinesis of inferior base.   These changes are present on echo from 09/2019   I have reviewed images   The patient has atherosclerosis of aorta as seen on CT   The pt probably has CAD and had event in past  but  I am not convinced she is having an acute cardiac event.  Trop flat  Abdomen is tender with decreased BS.   I have contacted Dr Danielle Morris to review     REcomm:   Place pt on telemetry    WOuld consider NG suction. Check troponin later today    2  Abnormal echo   Pt will need ischemic evaluation and cardiology follow up when fully recovers     3  HTN   BP is elevated, poss due to pain  I would place on metoprolol every 6 hours scheduled for now.    4  Lipids  Get lipids

## 2019-11-22 NOTE — Progress Notes (Signed)
[facility] Progress Note  3 Days Post-Op  Subjective: Patient had some chest pressure but also burning pain this AM? I was not notified via phone but through [facility] message that I received around 730.  Patient also had some abdominal pain overnight and had a BM has been taking in diet but says not eating much. Says she is having some reflux but the pain was like a "board over my chest." She is not longer having this and EKG looked similar. I ordered troponin and these are coming back elevated.   She reports feeling somewhat better.  CXR done and no acute process, some gas in stomach, much less likely free air but did have R hemicolectomy on Friday. Discussed with the reading radiologist after his report.   Objective: Vital signs in last 24 hours: Temp:  [98.6 F (37 C)-99 F (37.2 C)] 98.6 F (37 C) (06/14 9326) Pulse Rate:  [83-108] 83 (06/14 0647) Resp:  [18-20] 20 (06/14 0614) BP: (138-175)/(75-101) 153/82 (06/14 0647) SpO2:  [94 %-98 %] 98 % (06/14 0614) Weight:  [60 kg] 60 kg (06/14 0500) Last BM Date: 11/22/19  Intake/Output from previous day: 06/13 0701 - 06/14 0700 In: 720 [P.O.:720] Out: 2500 [Urine:2500] Intake/Output this shift: No intake/output data recorded.  General appearance: alert, cooperative and mild distress Resp: normal work of breathing GI: soft, nondistended, appropriately tender, no rebound or guarding Staples c/d/i with honeycomb dressing, minor dry blood inferior, no erythema or drainage  Lab Results:  Recent Labs    11/21/19 0705 11/21/19 0705 11/21/19 1407 11/22/19 0555  WBC 6.3  --   --  10.3  HGB 7.8*   < > 8.8* 9.8*  HCT 23.9*   < > 26.9* 29.3*  PLT 178  --   --  305   < > = values in this interval not displayed.   BMET Recent Labs    11/21/19 0705 11/22/19 0555  NA 130* 132*  K 4.6 4.8  CL 94* 92*  CO2 30 31  GLUCOSE 112* 174*  BUN 15 14  CREATININE 0.95 0.82  CALCIUM 8.2* 8.8*   PT/INR No results for  input(s): LABPROT, INR in the last 72 hours.  Studies/Results: DG Chest Port 1 View  Addendum Date: 11/22/2019   ADDENDUM REPORT: 11/22/2019 10:42 ADDENDUM: Patient reportedly had an exploratory laparotomy on Friday. Free air could certainly be seen following this procedure. If there are changes in symptoms or concern for acute abdominal process abdominal imaging either with radiography or CT could be considered. These results were called by telephone at the time of interpretation on 11/22/2019 at 10:42 am to provider [facility] , who verbally acknowledged these results. Electronically Signed   By: Zetta Bills M.D.   On: 11/22/2019 10:42   Result Date: 11/22/2019 CLINICAL DATA:  Chest pain, COVID negative on 06/08 2021 EXAM: PORTABLE CHEST 1 VIEW COMPARISON:  CT abdomen and pelvis 11/10/2019 FINDINGS: Cardiomediastinal contours partially obscured on the RIGHT and at the LEFT lung base. Patchy opacities at the lung bases more pronounced on the RIGHT along the medial aspect of the RIGHT hemidiaphragm and RIGHT heart border. No sign of effusion on AP portable radiograph. On limited assessment visualized skeletal structures without acute bone finding. In the LEFT upper quadrant there is a gas collection likely within the stomach but appears slightly more lateral than expected. Gas-filled loops of bowel are also seen in the upper abdomen. IMPRESSION: 1. Bibasilar opacities more pronounced on the RIGHT. Atelectasis versus  is developing pneumonia. 2. Gas collection likely within the stomach but appears slightly more lateral than expected. Correlate with any abdominal symptoms and correlation with supine and upright abdominal radiographs, to assess bowel gas and exclude the possibility of a small amount of pneumoperitoneum. Electronically Signed: By: Zetta Bills M.D. On: 11/22/2019 08:19    Anti-infectives: Anti-infectives (From admission, onward)   Start     Dose/Rate Route Frequency Ordered Stop    11/19/19 2200  cefoTEtan (CEFOTAN) 2 g in sodium chloride 0.9 % 100 mL IVPB        2 g 200 mL/hr over 30 Minutes Intravenous Every 12 hours 11/19/19 1440 11/20/19 2208   11/19/19 0945  cefoTEtan (CEFOTAN) 2 g in sodium chloride 0.9 % 100 mL IVPB        2 g 200 mL/hr over 30 Minutes Intravenous On call to O.R. 11/19/19 0942 11/19/19 1100      Assessment/Plan: Ms. 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 Having some abdominal pain with BM last night that is better, and chest pain that had some pressure and burning? I am unsure given her presentation if this is truly cardiac but she did have some elevation of the troponin also.  -Tylenol and narcotic PRN for pain -HD ok but have asked cardiology to see and weigh in given her pain and troponin -Soft diet and having BMs  -CBC and BMP in AM, no leukocytosis but getting a little shift?, H&H improved, some of the anemia was likely more from dilution than anything  -Foley out, UOP improved and Cr good, stopping fluids -SCDs, lovenox   Appreciate cardiology input.    LOS: 3 days    Virl Cagey 11/22/2019

## 2019-11-22 NOTE — Progress Notes (Addendum)
[facility]  Met patient in CT. Abdomen soft, tender in upper epigastric region, no rebound or guarding.    CT reviewed with radiology, anastomosis looks intact, no major fluid around, no gas tracking, there is a pocket of post op gas above the liver, minimal fluid in pelvis. No major concern for anastomotic leak given these finding, most consistent with ileus.  Stomach large some likely driving the discomfort. Will discuss NG placement with patient. She had a large BM last night, so hopefully this will be transient.  Also very anxious at baseline.  Cardiology saw and ECHO stable but does have inferior wall motion abnormality that was not called. Troponin has flattened out. On Telemetry now.   Updated patient and husband. NPO with meds and if Vomits will need NG, she does not want right now LR and bolus for IV contrast to help protect kidney Pepto for indigestion/ burning   Curlene Labrum, MD [facility] [place], [place] [address] [phone] (office)

## 2019-11-23 DIAGNOSIS — R9431 Abnormal electrocardiogram [ECG] [EKG]: Secondary | ICD-10-CM | POA: Diagnosis not present

## 2019-11-23 DIAGNOSIS — R079 Chest pain, unspecified: Secondary | ICD-10-CM | POA: Diagnosis not present

## 2019-11-23 LAB — CBC WITH DIFFERENTIAL/PLATELET
Abs Immature Granulocytes: 0.04 10*3/uL (ref 0.00–0.07)
Basophils Absolute: 0 10*3/uL (ref 0.0–0.1)
Basophils Relative: 0 %
Eosinophils Absolute: 0.1 10*3/uL (ref 0.0–0.5)
Eosinophils Relative: 1 %
HCT: 25.6 % — ABNORMAL LOW (ref 36.0–46.0)
Hemoglobin: 8.5 g/dL — ABNORMAL LOW (ref 12.0–15.0)
Immature Granulocytes: 1 %
Lymphocytes Relative: 23 %
Lymphs Abs: 1.9 10*3/uL (ref 0.7–4.0)
MCH: 30.5 pg (ref 26.0–34.0)
MCHC: 33.2 g/dL (ref 30.0–36.0)
MCV: 91.8 fL (ref 80.0–100.0)
Monocytes Absolute: 0.5 10*3/uL (ref 0.1–1.0)
Monocytes Relative: 6 %
Neutro Abs: 5.5 10*3/uL (ref 1.7–7.7)
Neutrophils Relative %: 69 %
Platelets: 335 10*3/uL (ref 150–400)
RBC: 2.79 MIL/uL — ABNORMAL LOW (ref 3.87–5.11)
RDW: 14.7 % (ref 11.5–15.5)
WBC: 8.1 10*3/uL (ref 4.0–10.5)
nRBC: 0 % (ref 0.0–0.2)

## 2019-11-23 LAB — BASIC METABOLIC PANEL
Anion gap: 7 (ref 5–15)
BUN: 11 mg/dL (ref 8–23)
CO2: 24 mmol/L (ref 22–32)
Calcium: 7.8 mg/dL — ABNORMAL LOW (ref 8.9–10.3)
Chloride: 98 mmol/L (ref 98–111)
Creatinine, Ser: 0.58 mg/dL (ref 0.44–1.00)
GFR calc Af Amer: >60 mL/min (ref >60)
GFR calc non Af Amer: >60 mL/min (ref >60)
Glucose, Bld: 121 mg/dL — ABNORMAL HIGH (ref 70–99)
Potassium: 3.6 mmol/L (ref 3.5–5.1)
Sodium: 129 mmol/L — ABNORMAL LOW (ref 135–145)

## 2019-11-23 NOTE — Progress Notes (Signed)
Subjective:  Denies SSCP, palpitations or Dyspnea   Objective:  Vitals:   11/22/19 2209 11/23/19 0233 11/23/19 0443 11/23/19 0545  BP: (!) 178/92 (!) 151/93 139/89   Pulse: 84 83 87   Resp:  19 18   Temp: 98.9 F (37.2 C) 98.8 F (37.1 C) 98.9 F (37.2 C)   TempSrc: Oral Oral Oral   SpO2: 97% 97% 98%   Weight:    63.2 kg  Height:        Intake/Output from previous day:  Intake/Output Summary (Last 24 hours) at 11/23/2019 0856 Last data filed at 11/22/2019 1753 Gross per 24 hour  Intake 462.5 ml  Output --  Net 462.5 ml    Physical Exam: Affect appropriate Healthy:  appears stated age HEENT: normal Neck supple with no adenopathy JVP normal no bruits no thyromegaly Lungs clear with no wheezing and good diaphragmatic motion Heart:  S1/S2 no murmur, no rub, gallop or click PMI normal Abdomen: post surgery incision looks fine  no bruit.  No HSM or HJR Distal pulses intact with no bruits No edema Neuro non-focal Skin warm and dry No muscular weakness  Lab Results: Basic Metabolic Panel: Recent Labs    11/21/19 0705 11/22/19 0555 11/22/19 1548 11/23/19 0618  NA 130*   < > 126* 129*  K 4.6   < > 3.6 3.6  CL 94*   < > 90* 98  CO2 30   < > 26 24  GLUCOSE 112*   < > 147* 121*  BUN 15   < > 14 11  CREATININE 0.95   < > 0.73 0.58  CALCIUM 8.2*   < > 8.2* 7.8*  MG 2.0  --   --   --   PHOS 2.3*  --   --   --    < > = values in this interval not displayed.   Liver Function Tests: Recent Labs    11/22/19 1548  AST 19  ALT 14  ALKPHOS 50  BILITOT 0.5  PROT 7.0  ALBUMIN 3.4*   No results for input(s): LIPASE, AMYLASE in the last 72 hours. CBC: Recent Labs    11/22/19 0555 11/23/19 0618  WBC 10.3 8.1  NEUTROABS 9.3* 5.5  HGB 9.8* 8.5*  HCT 29.3* 25.6*  MCV 91.3 91.8  PLT 305 335   Fasting Lipid Panel: Recent Labs    11/22/19 1548  CHOL 135  HDL 39*  LDLCALC 75  TRIG 105  CHOLHDL 3.5    Imaging: CT ABDOMEN PELVIS W  CONTRAST  Result Date: 11/22/2019 CLINICAL DATA:  64 year old female with history of abdominal abscess from acute appendicitis, status post right hemicolectomy. Epigastric pain radiating to the back. EXAM: CT ABDOMEN AND PELVIS WITH CONTRAST TECHNIQUE: Multidetector CT imaging of the abdomen and pelvis was performed using the standard protocol following bolus administration of intravenous contrast. CONTRAST:  14mL OMNIPAQUE IOHEXOL 300 MG/ML  SOLN COMPARISON:  CT the abdomen and pelvis 11/10/2019. FINDINGS: Lower chest: Small right and trace left pleural effusions lying dependently. Dependent subsegmental atelectasis in the lower lobes of the lungs bilaterally. Extensive thickening of the distal esophagus, likely reflective of esophagitis. Hepatobiliary: No suspicious cystic or solid hepatic lesions. No intra or extrahepatic biliary ductal dilatation. 2 cm partially calcified gallstone lying dependently in the gallbladder. No findings to suggest an acute cholecystitis at this time. Pancreas: No pancreatic mass. No pancreatic ductal dilatation. No pancreatic or peripancreatic fluid collections or inflammatory changes. Spleen: Unremarkable. Adrenals/Urinary Tract: 2.3  cm low-attenuation lesion in the upper pole of the left kidney, compatible with a simple cyst. Right kidney and bilateral adrenal glands are normal in appearance. No hydroureteronephrosis. Tiny amount of gas non dependently in the lumen of the urinary bladder. Urinary bladder is otherwise unremarkable in appearance. Stomach/Bowel: Normal appearance of the stomach. Multiple dilated loops of small bowel with multiple air-fluid levels, without definitive transition point, presumably a postoperative ileus. Status post right hemicolectomy small amount of intermediate to high attenuation fluid adjacent to the anastomosis tracking in the right retroperitoneum, presumably some resolving postoperative blood products. No well organized fluid collection exerting  local mass effect to suggest the presence of postoperative abscess. Vascular/Lymphatic: Aortic atherosclerosis, without evidence of aneurysm or dissection in the abdominal or pelvic vasculature. No lymphadenopathy noted in the abdomen or pelvis. Reproductive: In the anterior aspect of the uterine body there is a 1.1 cm hypervascular lesion, likely to represent a small fibroid. Bilateral ovaries are unremarkable in appearance. Other: Small volume of intermediate attenuation ascites. Small amount of pneumoperitoneum most evident in the right upper quadrant. Healing midline anterior abdominal wound with small amount of gas in the subcutaneous fat of the anterior abdominal wall. Musculoskeletal: There are no aggressive appearing lytic or blastic lesions noted in the visualized portions of the skeleton. IMPRESSION: 1. Postoperative changes of recent right hemicolectomy with small amount of resolving postoperative fluid adjacent to the anastomotic site, and small amount of resolving pneumoperitoneum. No definitive findings to suggest postoperative abscess at this time. 2. The appearance of the small bowel suggests a postoperative ileus, as above. 3. Small right and trace left pleural effusions lying dependently with extensive postoperative subsegmental atelectasis. 4. Profound thickening of the distal esophagus, new compared to the prior study, presumably reflective of esophagitis. 5. Cholelithiasis without evidence of acute cholecystitis at this time. 6. Aortic atherosclerosis. 7. Additional incidental findings, as above. Electronically Signed   By: Vinnie Langton M.D.   On: 11/22/2019 16:01   DG Chest Port 1 View  Addendum Date: 11/22/2019   ADDENDUM REPORT: 11/22/2019 10:42 ADDENDUM: Patient reportedly had an exploratory laparotomy on Friday. Free air could certainly be seen following this procedure. If there are changes in symptoms or concern for acute abdominal process abdominal imaging either with radiography  or CT could be considered. These results were called by telephone at the time of interpretation on 11/22/2019 at 10:42 am to provider [facility] , who verbally acknowledged these results. Electronically Signed   By: Zetta Bills M.D.   On: 11/22/2019 10:42   Result Date: 11/22/2019 CLINICAL DATA:  Chest pain, COVID negative on 06/08 2021 EXAM: PORTABLE CHEST 1 VIEW COMPARISON:  CT abdomen and pelvis 11/10/2019 FINDINGS: Cardiomediastinal contours partially obscured on the RIGHT and at the LEFT lung base. Patchy opacities at the lung bases more pronounced on the RIGHT along the medial aspect of the RIGHT hemidiaphragm and RIGHT heart border. No sign of effusion on AP portable radiograph. On limited assessment visualized skeletal structures without acute bone finding. In the LEFT upper quadrant there is a gas collection likely within the stomach but appears slightly more lateral than expected. Gas-filled loops of bowel are also seen in the upper abdomen. IMPRESSION: 1. Bibasilar opacities more pronounced on the RIGHT. Atelectasis versus is developing pneumonia. 2. Gas collection likely within the stomach but appears slightly more lateral than expected. Correlate with any abdominal symptoms and correlation with supine and upright abdominal radiographs, to assess bowel gas and exclude the possibility of a small  amount of pneumoperitoneum. Electronically Signed: By: Zetta Bills M.D. On: 11/22/2019 08:19   DG Abd 2 Views  Result Date: 11/22/2019 CLINICAL DATA:  Epigastric pain. EXAM: ABDOMEN - 2 VIEW COMPARISON:  CT abdomen pelvis dated November 10, 2019. FINDINGS: Air under the left hemidiaphragm is likely within the stomach. There is a 5.9 cm collection of air overlying the liver underneath the right hemidiaphragm. There are several loops of mildly dilated air-filled small bowel. Air within nondilated transverse colon. No radio-opaque calculi or other significant radiographic abnormality is seen. No acute  osseous abnormality. Skin staples over the midline lower abdomen. IMPRESSION: 1. 5.9 cm collection of air overlying the liver underneath the right hemidiaphragm. While this finding may be related to recent right hemicolectomy, the organized appearance is unusual. CT of the abdomen and pelvis is recommended for further evaluation. 2. Air beneath the left hemidiaphragm is likely within the stomach. 3. Mildly dilated loops of air-filled small bowel consistent with ileus. Electronically Signed   By: Titus Dubin M.D.   On: 11/22/2019 13:14   ECHOCARDIOGRAM COMPLETE  Result Date: 11/22/2019    ECHOCARDIOGRAM REPORT   Patient Name:   Danielle Morris Date of Exam: 11/22/2019 Medical Rec #:  [id number]    Height:       67.0 in Accession #:    [id number]   Weight:       132.3 lb Date of Birth:  November 26, 1955   BSA:          1.696 m Patient Age:    38 years     BP:           153/82 mmHg Patient Gender: F            HR:           83 bpm. Exam Location:  [facility] Procedure: 2D Echo STAT ECHO Indications:    NSTEMI I21.4  History:        Patient has prior history of Echocardiogram examinations, most                 recent 09/21/2019. Risk Factors:Dyslipidemia, Former Smoker and                 Hypertension.  Sonographer:    Leavy Cella RDCS (AE) Referring Phys: [place]  1. LVEF is approximately 50% with akinesis and aneurysmal dilitation of inferior wall (base, mid) hypokinesis of inferoseptal base. Compareed to images from April 2021 echo there is no significant changes . Left ventricular ejection fraction, by estimation, is 50%%. The left ventricle has low normal function. The left ventricle demonstrates regional wall motion abnormalities (see scoring diagram/findings for description). Left ventricular diastolic parameters are consistent with Grade I diastolic dysfunction (impaired relaxation).  2. Right ventricular systolic function is normal. The right ventricular size is normal.  3. The mitral  valve is grossly normal. No evidence of mitral valve regurgitation.  4. The aortic valve is tricuspid. Aortic valve regurgitation is not visualized. Mild aortic valve sclerosis is present, with no evidence of aortic valve stenosis.  5. The inferior vena cava is normal in size with greater than 50% respiratory variability, suggesting right atrial pressure of 3 mmHg. FINDINGS  Left Ventricle: LVEF is approximately 50% with akinesis and aneurysmal dilitation of inferior wall (base, mid) hypokinesis of inferoseptal base. Compareed to images from April 2021 echo there is no significant changes. Left ventricular ejection fraction, by estimation, is 50%%. The left ventricle has low normal  function. The left ventricle demonstrates regional wall motion abnormalities. The left ventricular internal cavity size was normal in size. There is no left ventricular hypertrophy. Left  ventricular diastolic parameters are consistent with Grade I diastolic dysfunction (impaired relaxation). Right Ventricle: The right ventricular size is normal. No increase in right ventricular wall thickness. Right ventricular systolic function is normal. Left Atrium: Left atrial size was normal in size. Right Atrium: Right atrial size was normal in size. Pericardium: Trivial pericardial effusion is present. Mitral Valve: The mitral valve is grossly normal. No evidence of mitral valve regurgitation. Tricuspid Valve: The tricuspid valve is normal in structure. Tricuspid valve regurgitation is trivial. Aortic Valve: The aortic valve is tricuspid. Aortic valve regurgitation is not visualized. Mild aortic valve sclerosis is present, with no evidence of aortic valve stenosis. Pulmonic Valve: The pulmonic valve was not well visualized. Pulmonic valve regurgitation is not visualized. Aorta: The aortic root is normal in size and structure. Venous: The inferior vena cava is normal in size with greater than 50% respiratory variability, suggesting right atrial  pressure of 3 mmHg. IAS/Shunts: No atrial level shunt detected by color flow Doppler.   Diastology LV e' lateral:   7.51 cm/s LV E/e' lateral: 7.1 LV e' medial:    6.42 cm/s LV E/e' medial:  8.3  RIGHT VENTRICLE RV S prime:     17.30 cm/s TAPSE (M-mode): 1.7 cm LEFT ATRIUM             Index LA Vol (A2C):   28.7 ml 16.92 ml/m LA Vol (A4C):   30.1 ml 17.74 ml/m LA Biplane Vol: 32.0 ml 18.86 ml/m  MITRAL VALVE MV Area (PHT): 2.24 cm MV Decel Time: 338 msec MV E velocity: 53.40 cm/s MV A velocity: 75.20 cm/s MV E/A ratio:  0.71 Dorris Carnes MD Electronically signed by Dorris Carnes MD Signature Date/Time: 11/22/2019/5:23:57 PM    Final     Cardiac Studies:  ECG:   Telemetry:  NSR this am    Echo: EF 50% inferior RWMA  Medications:   . acetaminophen  1,000 mg Oral Q6H  . Chlorhexidine Gluconate Cloth  6 each Topical Daily  . feeding supplement  237 mL Oral BID BM  . gabapentin  300 mg Oral BID  . heparin injection (subcutaneous)  5,000 Units Subcutaneous Q8H  . metoprolol tartrate  5 mg Intravenous Q6H  . pantoprazole  40 mg Oral Daily     . lactated ringers 75 mL/hr at 11/22/19 2350    Assessment/Plan:   1. Chest Pain:  Resolved was more epigastric pain likely related to surgery Troponin no trent max 107 now 81 ECG with no acute changes April ECG and yesterday show evidence of previous IMI. Echo with inferior RWMA. Previous smoker. Will need outpatient risk stratification with lexiscan myovue. No indication for acute cardiac w/u especially with recent surgery   Rx with low dose statin LDL 75 beta blocker and ASA   Jenkins Rouge 11/23/2019, 8:56 AM

## 2019-11-23 NOTE — Progress Notes (Signed)
[facility] Progress Note  4 Days Post-Op  Subjective: Having Bms and feeling better. Overall improving. No chest pain.   Objective: Vital signs in last 24 hours: Temp:  [98.8 F (37.1 C)-99.1 F (37.3 C)] 98.9 F (37.2 C) (06/15 0443) Pulse Rate:  [83-97] 87 (06/15 0443) Resp:  [17-19] 18 (06/15 0443) BP: (139-178)/(88-93) 139/89 (06/15 0443) SpO2:  [97 %-98 %] 98 % (06/15 0443) Weight:  [63.2 kg] 63.2 kg (06/15 0545) Last BM Date: 11/22/19  Intake/Output from previous day: 06/14 0701 - 06/15 0700 In: 462.5 [I.V.:462.5] Out: -  Intake/Output this shift: No intake/output data recorded.  General appearance: alert, cooperative and no distress Resp: normal work of breathing GI: soft, nondistended, dressing with dry blood inferior, no erythema or drainage of staples through honeycomb  Lab Results:  Recent Labs    11/22/19 0555 11/23/19 0618  WBC 10.3 8.1  HGB 9.8* 8.5*  HCT 29.3* 25.6*  PLT 305 335   BMET Recent Labs    11/22/19 1548 11/23/19 0618  NA 126* 129*  K 3.6 3.6  CL 90* 98  CO2 26 24  GLUCOSE 147* 121*  BUN 14 11  CREATININE 0.73 0.58  CALCIUM 8.2* 7.8*   PT/INR No results for input(s): LABPROT, INR in the last 72 hours.  Studies/Results: CT ABDOMEN PELVIS W CONTRAST  Result Date: 11/22/2019 CLINICAL DATA:  64 year old female with history of abdominal abscess from acute appendicitis, status post right hemicolectomy. Epigastric pain radiating to the back. EXAM: CT ABDOMEN AND PELVIS WITH CONTRAST TECHNIQUE: Multidetector CT imaging of the abdomen and pelvis was performed using the standard protocol following bolus administration of intravenous contrast. CONTRAST:  147mL OMNIPAQUE IOHEXOL 300 MG/ML  SOLN COMPARISON:  CT the abdomen and pelvis 11/10/2019. FINDINGS: Lower chest: Small right and trace left pleural effusions lying dependently. Dependent subsegmental atelectasis in the lower lobes of the lungs bilaterally. Extensive  thickening of the distal esophagus, likely reflective of esophagitis. Hepatobiliary: No suspicious cystic or solid hepatic lesions. No intra or extrahepatic biliary ductal dilatation. 2 cm partially calcified gallstone lying dependently in the gallbladder. No findings to suggest an acute cholecystitis at this time. Pancreas: No pancreatic mass. No pancreatic ductal dilatation. No pancreatic or peripancreatic fluid collections or inflammatory changes. Spleen: Unremarkable. Adrenals/Urinary Tract: 2.3 cm low-attenuation lesion in the upper pole of the left kidney, compatible with a simple cyst. Right kidney and bilateral adrenal glands are normal in appearance. No hydroureteronephrosis. Tiny amount of gas non dependently in the lumen of the urinary bladder. Urinary bladder is otherwise unremarkable in appearance. Stomach/Bowel: Normal appearance of the stomach. Multiple dilated loops of small bowel with multiple air-fluid levels, without definitive transition point, presumably a postoperative ileus. Status post right hemicolectomy small amount of intermediate to high attenuation fluid adjacent to the anastomosis tracking in the right retroperitoneum, presumably some resolving postoperative blood products. No well organized fluid collection exerting local mass effect to suggest the presence of postoperative abscess. Vascular/Lymphatic: Aortic atherosclerosis, without evidence of aneurysm or dissection in the abdominal or pelvic vasculature. No lymphadenopathy noted in the abdomen or pelvis. Reproductive: In the anterior aspect of the uterine body there is a 1.1 cm hypervascular lesion, likely to represent a small fibroid. Bilateral ovaries are unremarkable in appearance. Other: Small volume of intermediate attenuation ascites. Small amount of pneumoperitoneum most evident in the right upper quadrant. Healing midline anterior abdominal wound with small amount of gas in the subcutaneous fat of the anterior abdominal  wall. Musculoskeletal: There are  no aggressive appearing lytic or blastic lesions noted in the visualized portions of the skeleton. IMPRESSION: 1. Postoperative changes of recent right hemicolectomy with small amount of resolving postoperative fluid adjacent to the anastomotic site, and small amount of resolving pneumoperitoneum. No definitive findings to suggest postoperative abscess at this time. 2. The appearance of the small bowel suggests a postoperative ileus, as above. 3. Small right and trace left pleural effusions lying dependently with extensive postoperative subsegmental atelectasis. 4. Profound thickening of the distal esophagus, new compared to the prior study, presumably reflective of esophagitis. 5. Cholelithiasis without evidence of acute cholecystitis at this time. 6. Aortic atherosclerosis. 7. Additional incidental findings, as above. Electronically Signed   By: Vinnie Langton M.D.   On: 11/22/2019 16:01   DG Chest Port 1 View  Addendum Date: 11/22/2019   ADDENDUM REPORT: 11/22/2019 10:42 ADDENDUM: Patient reportedly had an exploratory laparotomy on Friday. Free air could certainly be seen following this procedure. If there are changes in symptoms or concern for acute abdominal process abdominal imaging either with radiography or CT could be considered. These results were called by telephone at the time of interpretation on 11/22/2019 at 10:42 am to provider [facility] , who verbally acknowledged these results. Electronically Signed   By: Zetta Bills M.D.   On: 11/22/2019 10:42   Result Date: 11/22/2019 CLINICAL DATA:  Chest pain, COVID negative on 06/08 2021 EXAM: PORTABLE CHEST 1 VIEW COMPARISON:  CT abdomen and pelvis 11/10/2019 FINDINGS: Cardiomediastinal contours partially obscured on the RIGHT and at the LEFT lung base. Patchy opacities at the lung bases more pronounced on the RIGHT along the medial aspect of the RIGHT hemidiaphragm and RIGHT heart border. No sign of effusion  on AP portable radiograph. On limited assessment visualized skeletal structures without acute bone finding. In the LEFT upper quadrant there is a gas collection likely within the stomach but appears slightly more lateral than expected. Gas-filled loops of bowel are also seen in the upper abdomen. IMPRESSION: 1. Bibasilar opacities more pronounced on the RIGHT. Atelectasis versus is developing pneumonia. 2. Gas collection likely within the stomach but appears slightly more lateral than expected. Correlate with any abdominal symptoms and correlation with supine and upright abdominal radiographs, to assess bowel gas and exclude the possibility of a small amount of pneumoperitoneum. Electronically Signed: By: Zetta Bills M.D. On: 11/22/2019 08:19   DG Abd 2 Views  Result Date: 11/22/2019 CLINICAL DATA:  Epigastric pain. EXAM: ABDOMEN - 2 VIEW COMPARISON:  CT abdomen pelvis dated November 10, 2019. FINDINGS: Air under the left hemidiaphragm is likely within the stomach. There is a 5.9 cm collection of air overlying the liver underneath the right hemidiaphragm. There are several loops of mildly dilated air-filled small bowel. Air within nondilated transverse colon. No radio-opaque calculi or other significant radiographic abnormality is seen. No acute osseous abnormality. Skin staples over the midline lower abdomen. IMPRESSION: 1. 5.9 cm collection of air overlying the liver underneath the right hemidiaphragm. While this finding may be related to recent right hemicolectomy, the organized appearance is unusual. CT of the abdomen and pelvis is recommended for further evaluation. 2. Air beneath the left hemidiaphragm is likely within the stomach. 3. Mildly dilated loops of air-filled small bowel consistent with ileus. Electronically Signed   By: Titus Dubin M.D.   On: 11/22/2019 13:14   ECHOCARDIOGRAM COMPLETE  Result Date: 11/22/2019    ECHOCARDIOGRAM REPORT   Patient Name:   LETOYA STALLONE Date of Exam: 11/22/2019  Medical Rec #:  [id number]    Height:       67.0 in Accession #:    [id number]   Weight:       132.3 lb Date of Birth:  Apr 24, 1956   BSA:          1.696 m Patient Age:    76 years     BP:           153/82 mmHg Patient Gender: F            HR:           83 bpm. Exam Location:  [facility] Procedure: 2D Echo STAT ECHO Indications:    NSTEMI I21.4  History:        Patient has prior history of Echocardiogram examinations, most                 recent 09/21/2019. Risk Factors:Dyslipidemia, Former Smoker and                 Hypertension.  Sonographer:    Leavy Cella RDCS (AE) Referring Phys: [place]  1. LVEF is approximately 50% with akinesis and aneurysmal dilitation of inferior wall (base, mid) hypokinesis of inferoseptal base. Compareed to images from April 2021 echo there is no significant changes . Left ventricular ejection fraction, by estimation, is 50%%. The left ventricle has low normal function. The left ventricle demonstrates regional wall motion abnormalities (see scoring diagram/findings for description). Left ventricular diastolic parameters are consistent with Grade I diastolic dysfunction (impaired relaxation).  2. Right ventricular systolic function is normal. The right ventricular size is normal.  3. The mitral valve is grossly normal. No evidence of mitral valve regurgitation.  4. The aortic valve is tricuspid. Aortic valve regurgitation is not visualized. Mild aortic valve sclerosis is present, with no evidence of aortic valve stenosis.  5. The inferior vena cava is normal in size with greater than 50% respiratory variability, suggesting right atrial pressure of 3 mmHg. FINDINGS  Left Ventricle: LVEF is approximately 50% with akinesis and aneurysmal dilitation of inferior wall (base, mid) hypokinesis of inferoseptal base. Compareed to images from April 2021 echo there is no significant changes. Left ventricular ejection fraction, by estimation, is 50%%. The left ventricle has  low normal function. The left ventricle demonstrates regional wall motion abnormalities. The left ventricular internal cavity size was normal in size. There is no left ventricular hypertrophy. Left  ventricular diastolic parameters are consistent with Grade I diastolic dysfunction (impaired relaxation). Right Ventricle: The right ventricular size is normal. No increase in right ventricular wall thickness. Right ventricular systolic function is normal. Left Atrium: Left atrial size was normal in size. Right Atrium: Right atrial size was normal in size. Pericardium: Trivial pericardial effusion is present. Mitral Valve: The mitral valve is grossly normal. No evidence of mitral valve regurgitation. Tricuspid Valve: The tricuspid valve is normal in structure. Tricuspid valve regurgitation is trivial. Aortic Valve: The aortic valve is tricuspid. Aortic valve regurgitation is not visualized. Mild aortic valve sclerosis is present, with no evidence of aortic valve stenosis. Pulmonic Valve: The pulmonic valve was not well visualized. Pulmonic valve regurgitation is not visualized. Aorta: The aortic root is normal in size and structure. Venous: The inferior vena cava is normal in size with greater than 50% respiratory variability, suggesting right atrial pressure of 3 mmHg. IAS/Shunts: No atrial level shunt detected by color flow Doppler.   Diastology LV e' lateral:   7.51 cm/s LV E/e'  lateral: 7.1 LV e' medial:    6.42 cm/s LV E/e' medial:  8.3  RIGHT VENTRICLE RV S prime:     17.30 cm/s TAPSE (M-mode): 1.7 cm LEFT ATRIUM             Index LA Vol (A2C):   28.7 ml 16.92 ml/m LA Vol (A4C):   30.1 ml 17.74 ml/m LA Biplane Vol: 32.0 ml 18.86 ml/m  MITRAL VALVE MV Area (PHT): 2.24 cm MV Decel Time: 338 msec MV E velocity: 53.40 cm/s MV A velocity: 75.20 cm/s MV E/A ratio:  0.71 Dorris Carnes MD Electronically signed by Dorris Carnes MD Signature Date/Time: 11/22/2019/5:23:57 PM    Final     Anti-infectives: Anti-infectives  (From admission, onward)   Start     Dose/Rate Route Frequency Ordered Stop   11/19/19 2200  cefoTEtan (CEFOTAN) 2 g in sodium chloride 0.9 % 100 mL IVPB        2 g 200 mL/hr over 30 Minutes Intravenous Every 12 hours 11/19/19 1440 11/20/19 2208   11/19/19 0945  cefoTEtan (CEFOTAN) 2 g in sodium chloride 0.9 % 100 mL IVPB        2 g 200 mL/hr over 30 Minutes Intravenous On call to O.R. 11/19/19 0942 11/19/19 1100      Assessment/Plan: Ms. 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 Feeling better and no nausea and having Bms.   -Tylenol and narcotic PRN for pain -HD, cardiology feels like she is improving and will need statin and beta block, asa at discharge  -Will add back to fulls and told her to go slow, she does not like the broths and having more BMs  -CBC looking good, BMP good -UOP good, LR @ 75 for now until eating  -SCDs, heparin sq    Discussed with cards PA and she says will need losartan decreased to 50, lopressor 25 bid, and asa 81, and crestor 20.    LOS: 4 days    Virl Cagey 11/23/2019

## 2019-11-23 NOTE — Plan of Care (Signed)
?  Problem: Clinical Measurements: ?Goal: Will remain free from infection ?Outcome: Progressing ?  ?

## 2019-11-24 LAB — URINALYSIS, COMPLETE (UACMP) WITH MICROSCOPIC
Bacteria, UA: NONE SEEN
Bilirubin Urine: NEGATIVE
Glucose, UA: NEGATIVE mg/dL
Ketones, ur: NEGATIVE mg/dL
Leukocytes,Ua: NEGATIVE
Nitrite: NEGATIVE
Protein, ur: NEGATIVE mg/dL
Specific Gravity, Urine: 1.012 (ref 1.005–1.030)
pH: 6 (ref 5.0–8.0)

## 2019-11-24 MED ORDER — LOSARTAN POTASSIUM 50 MG PO TABS
25.0000 mg | ORAL_TABLET | Freq: Every day | ORAL | Status: DC
  Administered 2019-11-24 – 2019-11-25 (×2): 25 mg via ORAL
  Filled 2019-11-24 (×2): qty 1

## 2019-11-24 MED ORDER — DIPHENHYDRAMINE HCL 25 MG PO CAPS
25.0000 mg | ORAL_CAPSULE | Freq: Every evening | ORAL | Status: DC | PRN
  Administered 2019-11-24: 25 mg via ORAL
  Filled 2019-11-24: qty 1

## 2019-11-24 MED ORDER — METOPROLOL TARTRATE 50 MG PO TABS
25.0000 mg | ORAL_TABLET | Freq: Two times a day (BID) | ORAL | Status: DC
  Administered 2019-11-24 – 2019-11-25 (×3): 25 mg via ORAL
  Filled 2019-11-24 (×3): qty 1

## 2019-11-24 MED ORDER — DOXYLAMINE SUCCINATE (SLEEP) 25 MG PO TABS: 25.0000 mg | ORAL_TABLET | Freq: Every evening | ORAL | Status: DC | PRN

## 2019-11-24 NOTE — Progress Notes (Signed)
Subjective:  Denies SSCP, palpitations or Dyspnea Taking soft solid food Low grade temp this am   Objective:  Vitals:   11/23/19 1738 11/23/19 2106 11/24/19 0500 11/24/19 0524  BP: 127/69 129/71  (!) 155/87  Pulse: 82 78  (!) 102  Resp: 18 20  20   Temp:  99.4 F (37.4 C)  (!) 100.4 F (38 C)  TempSrc:  Oral  Oral  SpO2: 97% 99%  95%  Weight:   65.5 kg   Height:        Intake/Output from previous day:  Intake/Output Summary (Last 24 hours) at 11/24/2019 0857 Last data filed at 11/24/2019 0700 Gross per 24 hour  Intake --  Output 2200 ml  Net -2200 ml    Physical Exam: Affect appropriate Healthy:  appears stated age HEENT: normal Neck supple with no adenopathy JVP normal no bruits no thyromegaly Lungs clear with no wheezing and good diaphragmatic motion Heart:  S1/S2 no murmur, no rub, gallop or click PMI normal Abdomen: post surgery incision looks fine  no bruit.  No HSM or HJR Distal pulses intact with no bruits No edema Neuro non-focal Skin warm and dry No muscular weakness  Lab Results: Basic Metabolic Panel: Recent Labs    11/22/19 1548 11/23/19 0618  NA 126* 129*  K 3.6 3.6  CL 90* 98  CO2 26 24  GLUCOSE 147* 121*  BUN 14 11  CREATININE 0.73 0.58  CALCIUM 8.2* 7.8*   Liver Function Tests: Recent Labs    11/22/19 1548  AST 19  ALT 14  ALKPHOS 50  BILITOT 0.5  PROT 7.0  ALBUMIN 3.4*   No results for input(s): LIPASE, AMYLASE in the last 72 hours. CBC: Recent Labs    11/22/19 0555 11/23/19 0618  WBC 10.3 8.1  NEUTROABS 9.3* 5.5  HGB 9.8* 8.5*  HCT 29.3* 25.6*  MCV 91.3 91.8  PLT 305 335   Fasting Lipid Panel: Recent Labs    11/22/19 1548  CHOL 135  HDL 39*  LDLCALC 75  TRIG 105  CHOLHDL 3.5    Imaging: CT ABDOMEN PELVIS W CONTRAST  Result Date: 11/22/2019 CLINICAL DATA:  64 year old female with history of abdominal abscess from acute appendicitis, status post right hemicolectomy. Epigastric pain radiating to the  back. EXAM: CT ABDOMEN AND PELVIS WITH CONTRAST TECHNIQUE: Multidetector CT imaging of the abdomen and pelvis was performed using the standard protocol following bolus administration of intravenous contrast. CONTRAST:  134mL OMNIPAQUE IOHEXOL 300 MG/ML  SOLN COMPARISON:  CT the abdomen and pelvis 11/10/2019. FINDINGS: Lower chest: Small right and trace left pleural effusions lying dependently. Dependent subsegmental atelectasis in the lower lobes of the lungs bilaterally. Extensive thickening of the distal esophagus, likely reflective of esophagitis. Hepatobiliary: No suspicious cystic or solid hepatic lesions. No intra or extrahepatic biliary ductal dilatation. 2 cm partially calcified gallstone lying dependently in the gallbladder. No findings to suggest an acute cholecystitis at this time. Pancreas: No pancreatic mass. No pancreatic ductal dilatation. No pancreatic or peripancreatic fluid collections or inflammatory changes. Spleen: Unremarkable. Adrenals/Urinary Tract: 2.3 cm low-attenuation lesion in the upper pole of the left kidney, compatible with a simple cyst. Right kidney and bilateral adrenal glands are normal in appearance. No hydroureteronephrosis. Tiny amount of gas non dependently in the lumen of the urinary bladder. Urinary bladder is otherwise unremarkable in appearance. Stomach/Bowel: Normal appearance of the stomach. Multiple dilated loops of small bowel with multiple air-fluid levels, without definitive transition point, presumably a postoperative ileus.  Status post right hemicolectomy small amount of intermediate to high attenuation fluid adjacent to the anastomosis tracking in the right retroperitoneum, presumably some resolving postoperative blood products. No well organized fluid collection exerting local mass effect to suggest the presence of postoperative abscess. Vascular/Lymphatic: Aortic atherosclerosis, without evidence of aneurysm or dissection in the abdominal or pelvic vasculature.  No lymphadenopathy noted in the abdomen or pelvis. Reproductive: In the anterior aspect of the uterine body there is a 1.1 cm hypervascular lesion, likely to represent a small fibroid. Bilateral ovaries are unremarkable in appearance. Other: Small volume of intermediate attenuation ascites. Small amount of pneumoperitoneum most evident in the right upper quadrant. Healing midline anterior abdominal wound with small amount of gas in the subcutaneous fat of the anterior abdominal wall. Musculoskeletal: There are no aggressive appearing lytic or blastic lesions noted in the visualized portions of the skeleton. IMPRESSION: 1. Postoperative changes of recent right hemicolectomy with small amount of resolving postoperative fluid adjacent to the anastomotic site, and small amount of resolving pneumoperitoneum. No definitive findings to suggest postoperative abscess at this time. 2. The appearance of the small bowel suggests a postoperative ileus, as above. 3. Small right and trace left pleural effusions lying dependently with extensive postoperative subsegmental atelectasis. 4. Profound thickening of the distal esophagus, new compared to the prior study, presumably reflective of esophagitis. 5. Cholelithiasis without evidence of acute cholecystitis at this time. 6. Aortic atherosclerosis. 7. Additional incidental findings, as above. Electronically Signed   By: Vinnie Langton M.D.   On: 11/22/2019 16:01   DG Abd 2 Views  Result Date: 11/22/2019 CLINICAL DATA:  Epigastric pain. EXAM: ABDOMEN - 2 VIEW COMPARISON:  CT abdomen pelvis dated November 10, 2019. FINDINGS: Air under the left hemidiaphragm is likely within the stomach. There is a 5.9 cm collection of air overlying the liver underneath the right hemidiaphragm. There are several loops of mildly dilated air-filled small bowel. Air within nondilated transverse colon. No radio-opaque calculi or other significant radiographic abnormality is seen. No acute osseous  abnormality. Skin staples over the midline lower abdomen. IMPRESSION: 1. 5.9 cm collection of air overlying the liver underneath the right hemidiaphragm. While this finding may be related to recent right hemicolectomy, the organized appearance is unusual. CT of the abdomen and pelvis is recommended for further evaluation. 2. Air beneath the left hemidiaphragm is likely within the stomach. 3. Mildly dilated loops of air-filled small bowel consistent with ileus. Electronically Signed   By: Titus Dubin M.D.   On: 11/22/2019 13:14   ECHOCARDIOGRAM COMPLETE  Result Date: 11/22/2019    ECHOCARDIOGRAM REPORT   Patient Name:   Danielle Morris Date of Exam: 11/22/2019 Medical Rec #:  [id number]    Height:       67.0 in Accession #:    [id number]   Weight:       132.3 lb Date of Birth:  03-18-1956   BSA:          1.696 m Patient Age:    27 years     BP:           153/82 mmHg Patient Gender: F            HR:           83 bpm. Exam Location:  [facility] Procedure: 2D Echo STAT ECHO Indications:    NSTEMI I21.4  History:        Patient has prior history of Echocardiogram examinations, most  recent 09/21/2019. Risk Factors:Dyslipidemia, Former Smoker and                 Hypertension.  Sonographer:    Leavy Cella RDCS (AE) Referring Phys: [place]  1. LVEF is approximately 50% with akinesis and aneurysmal dilitation of inferior wall (base, mid) hypokinesis of inferoseptal base. Compareed to images from April 2021 echo there is no significant changes . Left ventricular ejection fraction, by estimation, is 50%%. The left ventricle has low normal function. The left ventricle demonstrates regional wall motion abnormalities (see scoring diagram/findings for description). Left ventricular diastolic parameters are consistent with Grade I diastolic dysfunction (impaired relaxation).  2. Right ventricular systolic function is normal. The right ventricular size is normal.  3. The mitral valve is  grossly normal. No evidence of mitral valve regurgitation.  4. The aortic valve is tricuspid. Aortic valve regurgitation is not visualized. Mild aortic valve sclerosis is present, with no evidence of aortic valve stenosis.  5. The inferior vena cava is normal in size with greater than 50% respiratory variability, suggesting right atrial pressure of 3 mmHg. FINDINGS  Left Ventricle: LVEF is approximately 50% with akinesis and aneurysmal dilitation of inferior wall (base, mid) hypokinesis of inferoseptal base. Compareed to images from April 2021 echo there is no significant changes. Left ventricular ejection fraction, by estimation, is 50%%. The left ventricle has low normal function. The left ventricle demonstrates regional wall motion abnormalities. The left ventricular internal cavity size was normal in size. There is no left ventricular hypertrophy. Left  ventricular diastolic parameters are consistent with Grade I diastolic dysfunction (impaired relaxation). Right Ventricle: The right ventricular size is normal. No increase in right ventricular wall thickness. Right ventricular systolic function is normal. Left Atrium: Left atrial size was normal in size. Right Atrium: Right atrial size was normal in size. Pericardium: Trivial pericardial effusion is present. Mitral Valve: The mitral valve is grossly normal. No evidence of mitral valve regurgitation. Tricuspid Valve: The tricuspid valve is normal in structure. Tricuspid valve regurgitation is trivial. Aortic Valve: The aortic valve is tricuspid. Aortic valve regurgitation is not visualized. Mild aortic valve sclerosis is present, with no evidence of aortic valve stenosis. Pulmonic Valve: The pulmonic valve was not well visualized. Pulmonic valve regurgitation is not visualized. Aorta: The aortic root is normal in size and structure. Venous: The inferior vena cava is normal in size with greater than 50% respiratory variability, suggesting right atrial pressure of  3 mmHg. IAS/Shunts: No atrial level shunt detected by color flow Doppler.   Diastology LV e' lateral:   7.51 cm/s LV E/e' lateral: 7.1 LV e' medial:    6.42 cm/s LV E/e' medial:  8.3  RIGHT VENTRICLE RV S prime:     17.30 cm/s TAPSE (M-mode): 1.7 cm LEFT ATRIUM             Index LA Vol (A2C):   28.7 ml 16.92 ml/m LA Vol (A4C):   30.1 ml 17.74 ml/m LA Biplane Vol: 32.0 ml 18.86 ml/m  MITRAL VALVE MV Area (PHT): 2.24 cm MV Decel Time: 338 msec MV E velocity: 53.40 cm/s MV A velocity: 75.20 cm/s MV E/A ratio:  0.71 Dorris Carnes MD Electronically signed by Dorris Carnes MD Signature Date/Time: 11/22/2019/5:23:57 PM    Final     Cardiac Studies:  ECG: SR old IMI  Telemetry:  NSR this am    Echo: EF 50% inferior RWMA  Medications:   . acetaminophen  1,000 mg Oral Q6H  .  Chlorhexidine Gluconate Cloth  6 each Topical Daily  . feeding supplement  237 mL Oral BID BM  . gabapentin  300 mg Oral BID  . heparin injection (subcutaneous)  5,000 Units Subcutaneous Q8H  . metoprolol tartrate  5 mg Intravenous Q6H  . pantoprazole  40 mg Oral Daily       Assessment/Plan:   1. Chest Pain:  Resolved was more epigastric pain likely related to surgery Troponin no trent max 107 now 81 ECG with no acute changes April ECG and yesterday show evidence of previous IMI. Echo with inferior RWMA. Previous smoker. Will need outpatient risk stratification with lexiscan myovue. No indication for acute cardiac w/u especially with recent surgery   Rx with low dose statin LDL 75 beta blocker and ASA  Will sign off have arranged outpatient f/u in 2-3 weeks Discussed having elective stress testing as outpatient with patient    Jenkins Rouge 11/24/2019, 8:57 AM

## 2019-11-24 NOTE — Plan of Care (Signed)

## 2019-11-24 NOTE — Progress Notes (Signed)
[facility] Progress Note  5 Days Post-Op  Subjective: Patient with low grade fevers overnight. No abdominal pain. And no nausea. Pepto is helping with her burning/ indigestion. BP has been a little higher so starting back home meds.  Is using IS some but not often. No burning with Urination.   Patient says she a rough night and the Rns were rude and mean to her. She wants her husband to stay the night. She has bad panic and anxiety and normally never leaves the house.   Objective: Vital signs in last 24 hours: Temp:  [99.4 F (37.4 C)-100.4 F (38 C)] 100.4 F (38 C) (06/16 0524) Pulse Rate:  [78-102] 102 (06/16 0524) Resp:  [17-20] 20 (06/16 0524) BP: (120-155)/(69-87) 155/87 (06/16 0524) SpO2:  [95 %-100 %] 95 % (06/16 0524) Weight:  [65.5 kg] 65.5 kg (06/16 0500) Last BM Date: 11/23/19  Intake/Output from previous day: 06/15 0701 - 06/16 0700 In: -  Out: 2200 [Urine:2200] Intake/Output this shift: No intake/output data recorded.  General appearance: alert, cooperative and no distress Resp: normal work of breathing GI: soft, nondistended, nontender, staples with dry blood but no erythema or drainage  Pulling about 1000 on IS  Lab Results:  Recent Labs    11/22/19 0555 11/23/19 0618  WBC 10.3 8.1  HGB 9.8* 8.5*  HCT 29.3* 25.6*  PLT 305 335   BMET Recent Labs    11/22/19 1548 11/23/19 0618  NA 126* 129*  K 3.6 3.6  CL 90* 98  CO2 26 24  GLUCOSE 147* 121*  BUN 14 11  CREATININE 0.73 0.58  CALCIUM 8.2* 7.8*   PT/INR No results for input(s): LABPROT, INR in the last 72 hours.  Studies/Results: CT ABDOMEN PELVIS W CONTRAST  Result Date: 11/22/2019 CLINICAL DATA:  64 year old female with history of abdominal abscess from acute appendicitis, status post right hemicolectomy. Epigastric pain radiating to the back. EXAM: CT ABDOMEN AND PELVIS WITH CONTRAST TECHNIQUE: Multidetector CT imaging of the abdomen and pelvis was performed using the  standard protocol following bolus administration of intravenous contrast. CONTRAST:  13mL OMNIPAQUE IOHEXOL 300 MG/ML  SOLN COMPARISON:  CT the abdomen and pelvis 11/10/2019. FINDINGS: Lower chest: Small right and trace left pleural effusions lying dependently. Dependent subsegmental atelectasis in the lower lobes of the lungs bilaterally. Extensive thickening of the distal esophagus, likely reflective of esophagitis. Hepatobiliary: No suspicious cystic or solid hepatic lesions. No intra or extrahepatic biliary ductal dilatation. 2 cm partially calcified gallstone lying dependently in the gallbladder. No findings to suggest an acute cholecystitis at this time. Pancreas: No pancreatic mass. No pancreatic ductal dilatation. No pancreatic or peripancreatic fluid collections or inflammatory changes. Spleen: Unremarkable. Adrenals/Urinary Tract: 2.3 cm low-attenuation lesion in the upper pole of the left kidney, compatible with a simple cyst. Right kidney and bilateral adrenal glands are normal in appearance. No hydroureteronephrosis. Tiny amount of gas non dependently in the lumen of the urinary bladder. Urinary bladder is otherwise unremarkable in appearance. Stomach/Bowel: Normal appearance of the stomach. Multiple dilated loops of small bowel with multiple air-fluid levels, without definitive transition point, presumably a postoperative ileus. Status post right hemicolectomy small amount of intermediate to high attenuation fluid adjacent to the anastomosis tracking in the right retroperitoneum, presumably some resolving postoperative blood products. No well organized fluid collection exerting local mass effect to suggest the presence of postoperative abscess. Vascular/Lymphatic: Aortic atherosclerosis, without evidence of aneurysm or dissection in the abdominal or pelvic vasculature. No lymphadenopathy noted in the  abdomen or pelvis. Reproductive: In the anterior aspect of the uterine body there is a 1.1 cm  hypervascular lesion, likely to represent a small fibroid. Bilateral ovaries are unremarkable in appearance. Other: Small volume of intermediate attenuation ascites. Small amount of pneumoperitoneum most evident in the right upper quadrant. Healing midline anterior abdominal wound with small amount of gas in the subcutaneous fat of the anterior abdominal wall. Musculoskeletal: There are no aggressive appearing lytic or blastic lesions noted in the visualized portions of the skeleton. IMPRESSION: 1. Postoperative changes of recent right hemicolectomy with small amount of resolving postoperative fluid adjacent to the anastomotic site, and small amount of resolving pneumoperitoneum. No definitive findings to suggest postoperative abscess at this time. 2. The appearance of the small bowel suggests a postoperative ileus, as above. 3. Small right and trace left pleural effusions lying dependently with extensive postoperative subsegmental atelectasis. 4. Profound thickening of the distal esophagus, new compared to the prior study, presumably reflective of esophagitis. 5. Cholelithiasis without evidence of acute cholecystitis at this time. 6. Aortic atherosclerosis. 7. Additional incidental findings, as above. Electronically Signed   By: Vinnie Langton M.D.   On: 11/22/2019 16:01   DG Abd 2 Views  Result Date: 11/22/2019 CLINICAL DATA:  Epigastric pain. EXAM: ABDOMEN - 2 VIEW COMPARISON:  CT abdomen pelvis dated November 10, 2019. FINDINGS: Air under the left hemidiaphragm is likely within the stomach. There is a 5.9 cm collection of air overlying the liver underneath the right hemidiaphragm. There are several loops of mildly dilated air-filled small bowel. Air within nondilated transverse colon. No radio-opaque calculi or other significant radiographic abnormality is seen. No acute osseous abnormality. Skin staples over the midline lower abdomen. IMPRESSION: 1. 5.9 cm collection of air overlying the liver underneath the  right hemidiaphragm. While this finding may be related to recent right hemicolectomy, the organized appearance is unusual. CT of the abdomen and pelvis is recommended for further evaluation. 2. Air beneath the left hemidiaphragm is likely within the stomach. 3. Mildly dilated loops of air-filled small bowel consistent with ileus. Electronically Signed   By: Titus Dubin M.D.   On: 11/22/2019 13:14   ECHOCARDIOGRAM COMPLETE  Result Date: 11/22/2019    ECHOCARDIOGRAM REPORT   Patient Name:   KI LUCKMAN Date of Exam: 11/22/2019 Medical Rec #:  [id number]    Height:       67.0 in Accession #:    [id number]   Weight:       132.3 lb Date of Birth:  Oct 26, 1955   BSA:          1.696 m Patient Age:    67 years     BP:           153/82 mmHg Patient Gender: F            HR:           83 bpm. Exam Location:  [facility] Procedure: 2D Echo STAT ECHO Indications:    NSTEMI I21.4  History:        Patient has prior history of Echocardiogram examinations, most                 recent 09/21/2019. Risk Factors:Dyslipidemia, Former Smoker and                 Hypertension.  Sonographer:    Leavy Cella RDCS (AE) Referring Phys: [place]  1. LVEF is approximately 50% with akinesis and aneurysmal dilitation of  inferior wall (base, mid) hypokinesis of inferoseptal base. Compareed to images from April 2021 echo there is no significant changes . Left ventricular ejection fraction, by estimation, is 50%%. The left ventricle has low normal function. The left ventricle demonstrates regional wall motion abnormalities (see scoring diagram/findings for description). Left ventricular diastolic parameters are consistent with Grade I diastolic dysfunction (impaired relaxation).  2. Right ventricular systolic function is normal. The right ventricular size is normal.  3. The mitral valve is grossly normal. No evidence of mitral valve regurgitation.  4. The aortic valve is tricuspid. Aortic valve regurgitation is not  visualized. Mild aortic valve sclerosis is present, with no evidence of aortic valve stenosis.  5. The inferior vena cava is normal in size with greater than 50% respiratory variability, suggesting right atrial pressure of 3 mmHg. FINDINGS  Left Ventricle: LVEF is approximately 50% with akinesis and aneurysmal dilitation of inferior wall (base, mid) hypokinesis of inferoseptal base. Compareed to images from April 2021 echo there is no significant changes. Left ventricular ejection fraction, by estimation, is 50%%. The left ventricle has low normal function. The left ventricle demonstrates regional wall motion abnormalities. The left ventricular internal cavity size was normal in size. There is no left ventricular hypertrophy. Left  ventricular diastolic parameters are consistent with Grade I diastolic dysfunction (impaired relaxation). Right Ventricle: The right ventricular size is normal. No increase in right ventricular wall thickness. Right ventricular systolic function is normal. Left Atrium: Left atrial size was normal in size. Right Atrium: Right atrial size was normal in size. Pericardium: Trivial pericardial effusion is present. Mitral Valve: The mitral valve is grossly normal. No evidence of mitral valve regurgitation. Tricuspid Valve: The tricuspid valve is normal in structure. Tricuspid valve regurgitation is trivial. Aortic Valve: The aortic valve is tricuspid. Aortic valve regurgitation is not visualized. Mild aortic valve sclerosis is present, with no evidence of aortic valve stenosis. Pulmonic Valve: The pulmonic valve was not well visualized. Pulmonic valve regurgitation is not visualized. Aorta: The aortic root is normal in size and structure. Venous: The inferior vena cava is normal in size with greater than 50% respiratory variability, suggesting right atrial pressure of 3 mmHg. IAS/Shunts: No atrial level shunt detected by color flow Doppler.   Diastology LV e' lateral:   7.51 cm/s LV E/e'  lateral: 7.1 LV e' medial:    6.42 cm/s LV E/e' medial:  8.3  RIGHT VENTRICLE RV S prime:     17.30 cm/s TAPSE (M-mode): 1.7 cm LEFT ATRIUM             Index LA Vol (A2C):   28.7 ml 16.92 ml/m LA Vol (A4C):   30.1 ml 17.74 ml/m LA Biplane Vol: 32.0 ml 18.86 ml/m  MITRAL VALVE MV Area (PHT): 2.24 cm MV Decel Time: 338 msec MV E velocity: 53.40 cm/s MV A velocity: 75.20 cm/s MV E/A ratio:  0.71 Dorris Carnes MD Electronically signed by Dorris Carnes MD Signature Date/Time: 11/22/2019/5:23:57 PM    Final     Anti-infectives: Anti-infectives (From admission, onward)   Start     Dose/Rate Route Frequency Ordered Stop   11/19/19 2200  cefoTEtan (CEFOTAN) 2 g in sodium chloride 0.9 % 100 mL IVPB        2 g 200 mL/hr over 30 Minutes Intravenous Every 12 hours 11/19/19 1440 11/20/19 2208   11/19/19 0945  cefoTEtan (CEFOTAN) 2 g in sodium chloride 0.9 % 100 mL IVPB        2 g 200 mL/hr  over 30 Minutes Intravenous On call to O.R. 11/19/19 0942 11/19/19 1100      Assessment/Plan: Ms. 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 Doing better but having some fevers. Likely from atelectasis. Also checking a UA. No signs of wound infection, and no burning with UA. 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

## 2019-11-25 ENCOUNTER — Ambulatory Visit: Payer: BC Managed Care – PPO | Admitting: General Surgery

## 2019-11-25 LAB — CBC WITH DIFFERENTIAL/PLATELET
Abs Immature Granulocytes: 0.07 10*3/uL (ref 0.00–0.07)
Basophils Absolute: 0 10*3/uL (ref 0.0–0.1)
Basophils Relative: 1 %
Eosinophils Absolute: 0.3 10*3/uL (ref 0.0–0.5)
Eosinophils Relative: 7 %
HCT: 25.9 % — ABNORMAL LOW (ref 36.0–46.0)
Hemoglobin: 8.6 g/dL — ABNORMAL LOW (ref 12.0–15.0)
Immature Granulocytes: 1 %
Lymphocytes Relative: 24 %
Lymphs Abs: 1.2 10*3/uL (ref 0.7–4.0)
MCH: 30.5 pg (ref 26.0–34.0)
MCHC: 33.2 g/dL (ref 30.0–36.0)
MCV: 91.8 fL (ref 80.0–100.0)
Monocytes Absolute: 0.4 10*3/uL (ref 0.1–1.0)
Monocytes Relative: 8 %
Neutro Abs: 3 10*3/uL (ref 1.7–7.7)
Neutrophils Relative %: 59 %
Platelets: 307 10*3/uL (ref 150–400)
RBC: 2.82 MIL/uL — ABNORMAL LOW (ref 3.87–5.11)
RDW: 15.3 % (ref 11.5–15.5)
WBC: 4.9 10*3/uL (ref 4.0–10.5)
nRBC: 0 % (ref 0.0–0.2)

## 2019-11-25 LAB — BASIC METABOLIC PANEL
Anion gap: 7 (ref 5–15)
BUN: 7 mg/dL — ABNORMAL LOW (ref 8–23)
CO2: 26 mmol/L (ref 22–32)
Calcium: 8.2 mg/dL — ABNORMAL LOW (ref 8.9–10.3)
Chloride: 98 mmol/L (ref 98–111)
Creatinine, Ser: 0.65 mg/dL (ref 0.44–1.00)
GFR calc Af Amer: >60 mL/min (ref >60)
GFR calc non Af Amer: >60 mL/min (ref >60)
Glucose, Bld: 110 mg/dL — ABNORMAL HIGH (ref 70–99)
Potassium: 3.1 mmol/L — ABNORMAL LOW (ref 3.5–5.1)
Sodium: 131 mmol/L — ABNORMAL LOW (ref 135–145)

## 2019-11-25 MED ORDER — METOPROLOL TARTRATE 25 MG PO TABS: 25.0000 mg | ORAL_TABLET | Freq: Two times a day (BID) | ORAL | 1 refills | Status: DC

## 2019-11-25 MED ORDER — LOSARTAN POTASSIUM 50 MG PO TABS: 50.0000 mg | ORAL_TABLET | Freq: Every day | ORAL | 0 refills | Status: DC

## 2019-11-25 MED ORDER — OXYCODONE HCL 5 MG PO CAPS: 5.0000 mg | ORAL_CAPSULE | ORAL | 0 refills | Status: DC | PRN

## 2019-11-25 MED ORDER — PANTOPRAZOLE SODIUM 40 MG PO TBEC: 40.0000 mg | DELAYED_RELEASE_TABLET | Freq: Every day | ORAL | 1 refills | Status: DC

## 2019-11-25 MED ORDER — ONDANSETRON 8 MG PO TBDP: 8.0000 mg | ORAL_TABLET | Freq: Three times a day (TID) | ORAL | 1 refills | Status: DC | PRN

## 2019-11-25 NOTE — Plan of Care (Signed)

## 2019-11-25 NOTE — Discharge Summary (Signed)
Physician Discharge Summary  Patient ID: Danielle Morris MRN: [id number] DOB/AGE: [birth year] 64 y.o.  Admit date: 11/19/2019 Discharge date: 11/25/2019  Admission Diagnoses: Perforated appendicitis, fistula from IR drain to terminal ileum   Discharge Diagnoses:  Principal Problem:   Perforated appendix Active Problems:   Abscess of appendix   Small bowel fistula   Perforated appendicitis   Acute kidney injury ([place])   Postoperative anemia due to acute blood loss  Pathology: Clinical History: Perforated appendicitis, fistula of intestine   FINAL MICROSCOPIC DIAGNOSIS:   A. RIGHT COLON, RESECTION:  - Acute appendicitis, clinically perforated.  - Serosal adhesions and transmural defect of terminal ileum.  - Five morphologically benign lymph nodes.    GROSS DESCRIPTION:   Received fresh is a segment of right colon with attached portion of  terminal ileum and appendix. The right colon is 12 cm from cecum to  distal margin. There is 8 cm of terminal ileum. The appendix has a 2.2  cm in length by 0.8 cm in diameter disrupted proximal aspect with  surrounding indurated soft tissue. There is a separate 0.9 cm in length  disrupted tip with surrounding indurated soft tissue. The terminal  ileum has pink-red serosa with scattered adhesions. Within one area of  adhesions, 5 cm from the proximal margin, there is a 0.3 cm in diameter  transmural defect. On opening, there is dark red granular mucosa around  the defect. The remaining mucosa of the terminal ileum and right colon  is tan-pink and smooth with normal intestinal folds. There are no mass  lesions. Five lymph nodes are identified.  Block summary:  Block 1 = proximal margin.  Block 2 = distal margin.  Block 3 = transmural defect in serosa of terminal ileum.  Blocks 4-5 = disrupted appendix.  Block 6 = 5 lymph nodes.   Discharged Condition: Good  Hospital Course:  [name] is a 64 yo with a recent episode of perforated  appendicitis in April requiring IR drain. The drain fistualized to the terminal ileum, and this did not close. We brought her in for right hemicolectomy due to this fistula. She did well post operatively but did have some degree of ileus.  She had episode of some chest pain /epgiastric pain on POD 3 and was seen by cardiology with repeat EKG showing no ACS and troponin being elevated by stabilizing out and not consistent with acute coronary syndrome.  She was worked up that day for also concern for air under her diaphragm to ensure that she had no leak and this was all just post operative pneumoperitoneum. The CT a/p was consistent with normal post operative findings and normal pneumoperitoneum post operatively, and she was started back on her diet.  It is likely the chest pain / epigastric pain was from some degree of ileus/ gastric ileus seen on the CT and this all resolved with a few hours of bowel rest and Pepto bismul.   Cardiology did start her on metoprolol and asked her to follow up for stress test due to repeat ECHO demonstrating a inferior wall motion that was actually on her ECHO from April but not called.   She has been eating and drinking well with regular Bms. She had some low grade fevers that were resolved with incentive spirometry. She had a negative UA and the incision was without signs of infection.   Consults: cardiology  Significant Diagnostic Studies: CLINICAL DATA:  64 year old female with history of abdominal abscess from acute appendicitis, status  post right hemicolectomy. Epigastric pain radiating to the back.  EXAM: CT ABDOMEN AND PELVIS WITH CONTRAST  TECHNIQUE: Multidetector CT imaging of the abdomen and pelvis was performed using the standard protocol following bolus administration of intravenous contrast.  CONTRAST:  125mL OMNIPAQUE IOHEXOL 300 MG/ML  SOLN  COMPARISON:  CT the abdomen and pelvis 11/10/2019.  FINDINGS: Lower chest: Small right and trace  left pleural effusions lying dependently. Dependent subsegmental atelectasis in the lower lobes of the lungs bilaterally. Extensive thickening of the distal esophagus, likely reflective of esophagitis.  Hepatobiliary: No suspicious cystic or solid hepatic lesions. No intra or extrahepatic biliary ductal dilatation. 2 cm partially calcified gallstone lying dependently in the gallbladder. No findings to suggest an acute cholecystitis at this time.  Pancreas: No pancreatic mass. No pancreatic ductal dilatation. No pancreatic or peripancreatic fluid collections or inflammatory changes.  Spleen: Unremarkable.  Adrenals/Urinary Tract: 2.3 cm low-attenuation lesion in the upper pole of the left kidney, compatible with a simple cyst. Right kidney and bilateral adrenal glands are normal in appearance. No hydroureteronephrosis. Tiny amount of gas non dependently in the lumen of the urinary bladder. Urinary bladder is otherwise unremarkable in appearance.  Stomach/Bowel: Normal appearance of the stomach. Multiple dilated loops of small bowel with multiple air-fluid levels, without definitive transition point, presumably a postoperative ileus. Status post right hemicolectomy small amount of intermediate to high attenuation fluid adjacent to the anastomosis tracking in the right retroperitoneum, presumably some resolving postoperative blood products. No well organized fluid collection exerting local mass effect to suggest the presence of postoperative abscess.  Vascular/Lymphatic: Aortic atherosclerosis, without evidence of aneurysm or dissection in the abdominal or pelvic vasculature. No lymphadenopathy noted in the abdomen or pelvis.  Reproductive: In the anterior aspect of the uterine body there is a 1.1 cm hypervascular lesion, likely to represent a small fibroid. Bilateral ovaries are unremarkable in appearance.  Other: Small volume of intermediate attenuation ascites.  Small amount of pneumoperitoneum most evident in the right upper quadrant. Healing midline anterior abdominal wound with small amount of gas in the subcutaneous fat of the anterior abdominal wall.  Musculoskeletal: There are no aggressive appearing lytic or blastic lesions noted in the visualized portions of the skeleton.  IMPRESSION: 1. Postoperative changes of recent right hemicolectomy with small amount of resolving postoperative fluid adjacent to the anastomotic site, and small amount of resolving pneumoperitoneum. No definitive findings to suggest postoperative abscess at this time. 2. The appearance of the small bowel suggests a postoperative ileus, as above. 3. Small right and trace left pleural effusions lying dependently with extensive postoperative subsegmental atelectasis. 4. Profound thickening of the distal esophagus, new compared to the prior study, presumably reflective of esophagitis. 5. Cholelithiasis without evidence of acute cholecystitis at this time. 6. Aortic atherosclerosis. 7. Additional incidental findings, as above.   Electronically Signed   By: Vinnie Langton M.D.   On: 11/22/2019 16:01  STAT ECHO   Indications:  NSTEMI I21.4    History:    Patient has prior history of Echocardiogram examinations,  most         recent 09/21/2019. Risk Factors:Dyslipidemia, Former Smoker  and         Hypertension.    Sonographer:  Leavy Cella RDCS (AE)  Referring Phys: [employer]    1. LVEF is approximately 50% with akinesis and aneurysmal dilitation of  inferior wall (base, mid) hypokinesis of inferoseptal base. Compareed to  images from April 2021 echo there is  no significant changes . Left  ventricular ejection fraction, by  estimation, is 50%%. The left ventricle has low normal function. The left  ventricle demonstrates regional wall motion abnormalities (see scoring  diagram/findings for  description). Left ventricular diastolic parameters  are consistent with Grade I  diastolic dysfunction (impaired relaxation).  2. Right ventricular systolic function is normal. The right ventricular  size is normal.  3. The mitral valve is grossly normal. No evidence of mitral valve  regurgitation.  4. The aortic valve is tricuspid. Aortic valve regurgitation is not  visualized. Mild aortic valve sclerosis is present, with no evidence of  aortic valve stenosis.  5. The inferior vena cava is normal in size with greater than 50%  respiratory variability, suggesting right atrial pressure of 3 mmHg.   FINDINGS  Left Ventricle: LVEF is approximately 50% with akinesis and aneurysmal  dilitation of inferior wall (base, mid) hypokinesis of inferoseptal base.  Compareed to images from April 2021 echo there is no significant changes.  Left ventricular ejection  fraction, by estimation, is 50%%. The left ventricle has low normal  function. The left ventricle demonstrates regional wall motion  abnormalities. The left ventricular internal cavity size was normal in  size. There is no left ventricular hypertrophy. Left  ventricular diastolic parameters are consistent with Grade I diastolic  dysfunction (impaired relaxation).   Right Ventricle: The right ventricular size is normal. No increase in  right ventricular wall thickness. Right ventricular systolic function is  normal.   Left Atrium: Left atrial size was normal in size.   Right Atrium: Right atrial size was normal in size.   Pericardium: Trivial pericardial effusion is present.   Mitral Valve: The mitral valve is grossly normal. No evidence of mitral  valve regurgitation.   Tricuspid Valve: The tricuspid valve is normal in structure. Tricuspid  valve regurgitation is trivial.   Aortic Valve: The aortic valve is tricuspid. Aortic valve regurgitation is  not visualized. Mild aortic valve sclerosis is present, with no evidence   of aortic valve stenosis.   Pulmonic Valve: The pulmonic valve was not well visualized. Pulmonic valve  regurgitation is not visualized.   Aorta: The aortic root is normal in size and structure.   Venous: The inferior vena cava is normal in size with greater than 50%  respiratory variability, suggesting right atrial pressure of 3 mmHg.   IAS/Shunts: No atrial level shunt detected by color flow Doppler.       Diastology  LV e' lateral:  7.51 cm/s  LV E/e' lateral: 7.1  LV e' medial:  6.42 cm/s  LV E/e' medial: 8.3     RIGHT VENTRICLE  RV S prime:   17.30 cm/s  TAPSE (M-mode): 1.7 cm   LEFT ATRIUM       Index  LA Vol (A2C):  28.7 ml 16.92 ml/m  LA Vol (A4C):  30.1 ml 17.74 ml/m  LA Biplane Vol: 32.0 ml 18.86 ml/m  MITRAL VALVE  MV Area (PHT): 2.24 cm  MV Decel Time: 338 msec  MV E velocity: 53.40 cm/s  MV A velocity: 75.20 cm/s  MV E/A ratio: 0.71   Dorris Carnes MD  Electronically signed by Dorris Carnes MD  Signature Date/Time: 11/22/2019/5:23:57 PM   Treatments: IV hydration and Right hemicolectomy and removal of IR drain 11/19/2019  Discharge Exam: Blood pressure 139/81, pulse 72, temperature 98.1 F (36.7 C), temperature source Oral, resp. rate 20, height 5\' 7"  (1.702 m), weight 65.5 kg, SpO2 98 %. General appearance:  alert, cooperative and no distress Resp: normal work of breathing GI: soft, nondistended, incision c/d/i with staples and no erythema or drainage  Disposition: Discharge disposition: 01-Home or Self Care       Discharge Instructions    Call MD for:  difficulty breathing, headache or visual disturbances   Complete by: As directed    Call MD for:  extreme fatigue   Complete by: As directed    Call MD for:  persistant dizziness or light-headedness   Complete by: As directed    Call MD for:  persistant nausea and vomiting   Complete by: As directed    Call MD for:  redness, tenderness, or signs of infection (pain,  swelling, redness, odor or green/yellow discharge around incision site)   Complete by: As directed    Call MD for:  severe uncontrolled pain   Complete by: As directed    Call MD for:  temperature >100.4   Complete by: As directed    Increase activity slowly   Complete by: As directed      Allergies as of 11/25/2019      Reactions   Amlodipine Shortness Of Breath, Anxiety, Palpitations   Tramadol    Rushes thru body, miserable, shaking and crying   Hydrocodone Nausea And Vomiting   Lisinopril Other (See Comments)   unknown   Chantix [varenicline] Nausea Only, Anxiety      Medication List    STOP taking these medications   docusate calcium 240 MG capsule Commonly known as: SURFAK     TAKE these medications   acetaminophen 500 MG tablet Commonly known as: TYLENOL Take 500 mg by mouth every 6 (six) hours as needed for moderate pain or headache.   cyclobenzaprine 5 MG tablet Commonly known as: FLEXERIL Take 1 tablet (5 mg total) by mouth 3 (three) times daily as needed for muscle spasms.   doxylamine (Sleep) 25 MG tablet Commonly known as: UNISOM Take 25 mg by mouth at bedtime as needed for sleep.   losartan 50 MG tablet Commonly known as: COZAAR Take 1 tablet (50 mg total) by mouth daily. What changed:   medication strength  See the new instructions.   metoprolol tartrate 25 MG tablet Commonly known as: LOPRESSOR Take 1 tablet (25 mg total) by mouth 2 (two) times daily.   NUPERCAINAL RE Place 1 application rectally daily as needed (pain).   ondansetron 8 MG disintegrating tablet Commonly known as: Zofran ODT Take 1 tablet (8 mg total) by mouth every 8 (eight) hours as needed for nausea or vomiting.   oxycodone 5 MG capsule Commonly known as: OXY-IR Take 1-2 capsules (5-10 mg total) by mouth every 4 (four) hours as needed for pain. What changed: how much to take   pantoprazole 40 MG tablet Commonly known as: PROTONIX Take 1 tablet (40 mg total) by mouth  daily. Start taking on: November 26, 2019   sertraline 50 MG tablet Commonly known as: ZOLOFT TAKE 1 TABLET(50 MG) BY MOUTH DAILY   Stool Softener 100 MG capsule Generic drug: docusate sodium Take 100 mg by mouth daily.       Follow-up Information    Imogene Burn, PA-C Follow up on 12/06/2019.   Specialty: Cardiology Why: Cardiology Hospital Follow-up on 12/06/2019 at 2:00 PM.  Contact information: [place] [address] [phone]        Aviva Signs, MD Follow up on 12/02/2019.   Specialty: General Surgery Why: staple removal, Dr. Constance Haw will be  on vacation  Contact information: [address] [place] [address] [phone]        Susy Frizzle, MD Follow up in 2 week(s).   Specialty: Family Medicine Why: Further blood pressure adjustment. You were started on metoprolol 25 twice daily by cardiology, and your cozaar was reduced to 50 mg daily. Call and get an appt with PCP to further evaluate and adjust. Call for an appt in the next 1-2 weeks.  Contact information: [place] [address] [phone]               Signed: Virl Cagey 11/25/2019, 11:36 AM

## 2019-11-25 NOTE — Plan of Care (Signed)
  Problem: Education: Goal: Knowledge of General Education information will improve Description: Including pain rating scale, medication(s)/side effects and non-pharmacologic comfort measures 11/25/2019 1140 by Melony Overly, RN Outcome: Adequate for Discharge 11/25/2019 0825 by Melony Overly, RN Outcome: Progressing   Problem: Health Behavior/Discharge Planning: Goal: Ability to manage health-related needs will improve 11/25/2019 1140 by Melony Overly, RN Outcome: Adequate for Discharge 11/25/2019 0825 by Melony Overly, RN Outcome: Progressing   Problem: Clinical Measurements: Goal: Ability to maintain clinical measurements within normal limits will improve 11/25/2019 1140 by Melony Overly, RN Outcome: Adequate for Discharge 11/25/2019 0825 by Melony Overly, RN Outcome: Progressing Goal: Will remain free from infection 11/25/2019 1140 by Melony Overly, RN Outcome: Adequate for Discharge 11/25/2019 0825 by Melony Overly, RN Outcome: Progressing Goal: Diagnostic test results will improve 11/25/2019 1140 by Melony Overly, RN Outcome: Adequate for Discharge 11/25/2019 0825 by Melony Overly, RN Outcome: Progressing Goal: Respiratory complications will improve 11/25/2019 1140 by Melony Overly, RN Outcome: Adequate for Discharge 11/25/2019 0825 by Melony Overly, RN Outcome: Progressing Goal: Cardiovascular complication will be avoided 11/25/2019 1140 by Melony Overly, RN Outcome: Adequate for Discharge 11/25/2019 0825 by Melony Overly, RN Outcome: Progressing   Problem: Activity: Goal: Risk for activity intolerance will decrease 11/25/2019 1140 by Melony Overly, RN Outcome: Adequate for Discharge 11/25/2019 0825 by Melony Overly, RN Outcome: Progressing   Problem: Nutrition: Goal: Adequate nutrition will be maintained 11/25/2019 1140 by Melony Overly, RN Outcome: Adequate for Discharge 11/25/2019 0825 by Melony Overly, RN Outcome: Progressing    Problem: Coping: Goal: Level of anxiety will decrease 11/25/2019 1140 by Melony Overly, RN Outcome: Adequate for Discharge 11/25/2019 0825 by Melony Overly, RN Outcome: Progressing   Problem: Elimination: Goal: Will not experience complications related to bowel motility 11/25/2019 1140 by Melony Overly, RN Outcome: Adequate for Discharge 11/25/2019 0825 by Melony Overly, RN Outcome: Progressing Goal: Will not experience complications related to urinary retention 11/25/2019 1140 by Melony Overly, RN Outcome: Adequate for Discharge 11/25/2019 0825 by Melony Overly, RN Outcome: Progressing   Problem: Pain Managment: Goal: General experience of comfort will improve 11/25/2019 1140 by Melony Overly, RN Outcome: Adequate for Discharge 11/25/2019 0825 by Melony Overly, RN Outcome: Progressing   Problem: Safety: Goal: Ability to remain free from injury will improve 11/25/2019 1140 by Melony Overly, RN Outcome: Adequate for Discharge 11/25/2019 0825 by Melony Overly, RN Outcome: Progressing   Problem: Skin Integrity: Goal: Risk for impaired skin integrity will decrease 11/25/2019 1140 by Melony Overly, RN Outcome: Adequate for Discharge 11/25/2019 0825 by Melony Overly, RN Outcome: Progressing

## 2019-11-25 NOTE — Discharge Instructions (Signed)
Discharge Open Abdominal Surgery Instructions:  Common Complaints: Pain at the incision site is common. This will improve with time. Take your pain medications as described below. Some nausea is common and poor appetite. The main goal is to stay hydrated the first few days after surgery.   Diet/ Activity: Diet as tolerated. You have started and tolerated a diet in the hospital, and should continue to increase what you are able to eat.   You may not have a large appetite, but it is important to stay hydrated. Drink 64 ounces of water a day. Your appetite will return with time.  Keep a dry dressing in place over your staples daily or as needed. Some minor pink/ blood tinged drainage is expected. This will stop in a few days after surgery.  Shower per your regular routine daily.  Do not take hot showers. Take warm showers that are less than 10 minutes. Pat the incision dry. Wear an abdominal binder daily with activity. You do not have to wear this while sleeping or sitting.  Rest and listen to your body, but do not remain in bed all day.  Walk everyday for at least 15-20 minutes. Deep cough and move around every 1-2 hours in the first few days after surgery.  Do not lift > 10 lbs, perform excessive bending, pushing, pulling, squatting for 6-8 weeks after surgery.  The activity restrictions and the abdominal binder are to prevent hernia formation at your incision while you are healing.  Do not place lotions or balms on your incision unless instructed to specifically by Dr. Constance Haw.   Cardiology/ Blood Pressure Medication: Cardiology started you no a new BP medication- Metoprolol 25 mg twice daily, and you need to take half of you Cozaar dose, so you are now on Cozaar 50 mg daily (no longer 100 mg daily).  Follow up with PCP and cardiology for further adjustment.   Pain Expectations and Narcotics: -After surgery you will have pain associated with your incisions and this is normal. The pain is  muscular and nerve pain, and will get better with time. -You are encouraged and expected to take non narcotic medications like tylenol and ibuprofen (when able) to treat pain as multiple modalities can aid with pain treatment. -Narcotics are only used when pain is severe or there is breakthrough pain. -You are not expected to have a pain score of 0 after surgery, as we cannot prevent pain. A pain score of 3-4 that allows you to be functional, move, walk, and tolerate some activity is the goal. The pain will continue to improve over the days after surgery and is dependent on your surgery. -Due to [place] law, we are only able to give a certain amount of pain medication to treat post operative pain, and we only give additional narcotics on a patient by patient basis.  -For most laparoscopic surgery, studies have shown that the majority of patients only need 10-15 narcotic pills, and for open surgeries most patients only need 15-20.   -Having appropriate expectations of pain and knowledge of pain management with non narcotics is important as we do not want anyone to become addicted to narcotic pain medication.  -Using ice packs in the first 48 hours and heating pads after 48 hours, wearing an abdominal binder (when recommended), and using over the counter medications are all ways to help with pain management.   -Simple acts like meditation and mindfulness practices after surgery can also help with pain control and research has proven  the benefit of these practices.  Medication: Take tylenol and ibuprofen as needed for pain control, alternating every 4-6 hours.  Example:  Tylenol 1000mg  @ 6am, 12noon, 6pm, 82midnight (Do not exceed 4000mg  of tylenol a day). Ibuprofen 800mg  @ 9am, 3pm, 9pm, 3am (Do not exceed 3600mg  of ibuprofen a day).  Take Roxicodone for breakthrough pain every 4 hours.  Take Colace for constipation related to narcotic pain medication. If you do not have a bowel movement in 2 days, take  Miralax over the counter.  Drink plenty of water to also prevent constipation.   Contact Information: If you have questions or concerns, please call our office, [phone], Monday- Thursday 8AM-5PM and Friday 8AM-12Noon.  If it is after hours or on the weekend, please call [facility], [phone], and ask to speak to the surgeon on call for Dr. Constance Haw at [facility].    Open partial colectomy, Care After This sheet gives you information about how to care for yourself after your procedure. Your health care provider may also give you more specific instructions. If you have problems or questions, contact your health care provider. What can I expect after the procedure? After the procedure, it is common to have:  Pain in your abdomen, especially along your incision.  Tiredness. Your energy level will return to normal over the next several weeks.  Constipation.  Nausea.  Difficulty urinating. Follow these instructions at home: Activity  You may be able to return to most of your normal activities within 1-2 weeks, such as working, walking up stairs, and sexual activity.  Avoid activities that require a lot of energy for 4-6 weeks after surgery, such as running, climbing, and lifting heavy objects. Ask your health care provider what activities are safe for you.  Take rest breaks during the day as needed.  Do not drive for 1-2 weeks or until your health care provider says that it is safe.  Do not drive or use heavy machinery while taking prescription pain medicines.  Do not lift anything that is heavier than 10 lb (4.3 kg) until your health care provider says that it is safe. Incision care   Follow instructions from your health care provider about how to take care of your incision. Make sure you: ? Wash your hands with soap and water before you change your bandage (dressing). If soap and water are not available, use hand sanitizer. ? Change your dressing as told by your  health care provider. ? Leave stitches (sutures) or staples in place. These skin closures may need to stay in place for 2 weeks or longer.  Avoid wearing tight clothing around your incision.  Protect your incision area from the sun.  Check your incision area every day for signs of infection. Check for: ? More redness, swelling, or pain. ? More fluid or blood. ? Warmth. ? Pus or a bad smell. General instructions  Do not take baths, swim, or use a hot tub until your health care provider approves. Ask your health care provider when you may shower.  Take over-the-counter and prescription medicines, including stool softeners, only as told by your health care provider.  Eat a low-fat and low-fiber diet for the first 4 weeks after surgery.  Keep all follow-up visits as told by your health care provider. This is important. Contact a health care provider if:  You have more redness, swelling, or pain around your incision.  You have more fluid or blood coming from your incision.  Your incision feels  warm to the touch.  You have pus or a bad smell coming from your incision.  You have a fever or chills.  You do not have a bowel movement 2-3 days after surgery.  You cannot eat or drink for 24 hours or more.  You have persistent nausea and vomiting.  You have abdominal pain that gets worse and does not get better with medicine. Get help right away if:  You have chest pain.  You have shortness of breath.  You have pain or swelling in your legs.  Your incision breaks open after your sutures or staples have been removed.  You have bleeding from the rectum. This information is not intended to replace advice given to you by your health care provider. Make sure you discuss any questions you have with your health care provider. Document Revised: 05/09/2017 Document Reviewed: 02/26/2016 Elsevier Patient Education  2020 [employer].

## 2019-12-01 NOTE — Progress Notes (Deleted)
Cardiology Office Note    Date:  12/01/2019   ID:  Chaunte, Hornbeck Sep 23, 1955, MRN [id number]  PCP:  Susy Frizzle, MD  Cardiologist: Dorris Carnes, MD EPS: None  No chief complaint on file.   History of Present Illness:  Danielle Morris is a 64 y.o. female with hypertension, HLD, family history of early CAD father died of an MI at 41 and 25-pack-year history of tobacco abuse quit smoking 2 months ago.  Patient hospitalized with ruptured appendix S/P right hemicolectomy and had chest pain with elevated troponins max of 107, EKG without acute change but evidence of previous MI similar to EKG in April.  Echo with inferior wall motion abnormality.  Plan was for outpatient risk stratification with Lexiscan Myoview.  LDL 75 low-dose statin started beta-blocker and aspirin.    Past Medical History:  Diagnosis Date   GAD (generalized anxiety disorder)    HLD (hyperlipidemia)    Hypertension    Panic attack    Tobacco abuse     Past Surgical History:  Procedure Laterality Date   COLONOSCOPY WITH PROPOFOL N/A 11/02/2013   Procedure: COLONOSCOPY WITH PROPOFOL;  Surgeon: Danie Binder, MD;  Location: [facility];  Service: Endoscopy;  Laterality: N/A;  in cecum @ 1002; cecal withdrawal time- minutes   HEMORRHOID BANDING N/A 11/02/2013   Procedure: HEMORRHOID BANDING;  Surgeon: Danie Binder, MD;  Location: [facility];  Service: Endoscopy;  Laterality: N/A;   HEMORRHOID SURGERY N/A 11/05/2013   Procedure: EXTENSIVE HEMORRHOIDECTOMY ;  Surgeon: Jamesetta So, MD;  Location: [facility];  Service: General;  Laterality: N/A;   IR RADIOLOGIST EVAL & MGMT  10/05/2019   IR RADIOLOGIST EVAL & MGMT  10/19/2019   IR RADIOLOGIST EVAL & MGMT  11/10/2019   PARTIAL COLECTOMY N/A 11/19/2019   Procedure: PARTIAL COLECTOMY;  Surgeon: Virl Cagey, MD;  Location: [facility];  Service: General;  Laterality: N/A;   POLYPECTOMY N/A 11/02/2013   Procedure: POLYPECTOMY;  Surgeon: Danie Binder, MD;  Location:  [facility];  Service: Endoscopy;  Laterality: N/A;  sigmoid colon, hepatic flexure, hyperplastic rectal, and rectal polyps   TONSILLECTOMY     age  77    Current Medications: No outpatient medications have been marked as taking for the 12/06/19 encounter (Appointment) with Imogene Burn, PA-C.     Allergies:   Amlodipine, Tramadol, Hydrocodone, Lisinopril, and Chantix [varenicline]   Social History   Socioeconomic History   Marital status: Married    Spouse name: Not on file   Number of children: Not on file   Years of education: Not on file   Highest education level: Not on file  Occupational History   Not on file  Tobacco Use   Smoking status: Former Smoker    Packs/day: 0.50    Years: 10.00    Pack years: 5.00    Types: Cigarettes    Quit date: 09/09/2019    Years since quitting: 0.2   Smokeless tobacco: Never Used   Tobacco comment: uses E-cigs  Substance and Sexual Activity   Alcohol use: No   Drug use: No   Sexual activity: Not on file  Other Topics Concern   Not on file  Social History Narrative   Entered 03/2014:   Married.    2 Daughters and son in law, and 1 grandchild live with them now.    Keeps another grandchild frequently also.   Does not work outside of house.  Social Determinants of Health   Financial Resource Strain:    Difficulty of Paying Living Expenses:   Food Insecurity:    Worried About Charity fundraiser in the Last Year:    Arboriculturist in the Last Year:   Transportation Needs:    Film/video editor (Medical):    Lack of Transportation (Non-Medical):   Physical Activity:    Days of Exercise per Week:    Minutes of Exercise per Session:   Stress:    Feeling of Stress :   Social Connections:    Frequency of Communication with Friends and Family:    Frequency of Social Gatherings with Friends and Family:    Attends Religious Services:    Active Member of Clubs or Organizations:    Attends Programme researcher, broadcasting/film/video:    Marital Status:      Family History:  The patient's ***family history includes Diabetes in an other family member; Heart disease (age of onset: 56) in her father; Hypertension in her brother and brother.   ROS:   Please see the history of present illness.    ROS All other systems reviewed and are negative.   PHYSICAL EXAM:   VS:  There were no vitals taken for this visit.  Physical Exam  GEN: Well nourished, well developed, in no acute distress  HEENT: normal  Neck: no JVD, carotid bruits, or masses Cardiac:RRR; no murmurs, rubs, or gallops  Respiratory:  clear to auscultation bilaterally, normal work of breathing GI: soft, nontender, nondistended, + BS Ext: without cyanosis, clubbing, or edema, Good distal pulses bilaterally MS: no deformity or atrophy  Skin: warm and dry, no rash Neuro:  Alert and Oriented x 3, Strength and sensation are intact Psych: euthymic mood, full affect  Wt Readings from Last 3 Encounters:  11/24/19 144 lb 6.4 oz (65.5 kg)  11/16/19 131 lb (59.4 kg)  11/16/19 133 lb (60.3 kg)      Studies/Labs Reviewed:   EKG:  EKG is*** ordered today.  The ekg ordered today demonstrates ***  Recent Labs: 09/21/2019: TSH 0.695 11/21/2019: Magnesium 2.0 11/22/2019: ALT 14 11/25/2019: BUN 7; Creatinine, Ser 0.65; Hemoglobin 8.6; Platelets 307; Potassium 3.1; Sodium 131   Lipid Panel    Component Value Date/Time   CHOL 135 11/22/2019 1548   TRIG 105 11/22/2019 1548   HDL 39 (L) 11/22/2019 1548   CHOLHDL 3.5 11/22/2019 1548   VLDL 21 11/22/2019 1548   LDLCALC 75 11/22/2019 1548   LDLCALC 193 (H) 07/20/2019 1610    Additional studies/ records that were reviewed today include:   2D echo 6/14/2021IMPRESSIONS     1. LVEF is approximately 50% with akinesis and aneurysmal dilitation of  inferior wall (base, mid) hypokinesis of inferoseptal base. Compareed to  images from April 2021 echo there is no significant changes . Left    ventricular ejection fraction, by  estimation, is 50%%. The left ventricle has low normal function. The left  ventricle demonstrates regional wall motion abnormalities (see scoring  diagram/findings for description). Left ventricular diastolic parameters  are consistent with Grade I  diastolic dysfunction (impaired relaxation).   2. Right ventricular systolic function is normal. The right ventricular  size is normal.   3. The mitral valve is grossly normal. No evidence of mitral valve  regurgitation.   4. The aortic valve is tricuspid. Aortic valve regurgitation is not  visualized. Mild aortic valve sclerosis is present, with no evidence of  aortic valve stenosis.  5. The inferior vena cava is normal in size with greater than 50%  respiratory variability, suggesting right atrial pressure of 3 mmHg.   FINDINGS   Left Ventricle: LVEF is approximately 50% with akinesis and aneurysmal  dilitation of inferior wall (base, mid) hypokinesis of inferoseptal base.  Compareed to images from April 2021 echo there is no significant changes.  Left ventricular ejection  fraction, by estimation, is 50%%. The left ventricle has low normal  function. The left ventricle demonstrates regional wall motion  abnormalities. The left ventricular internal cavity size was normal in  size. There is no left ventricular hypertrophy. Left   ventricular diastolic parameters are consistent with Grade I diastolic  dysfunction (impaired relaxation).    2D echo 09/2019 IMPRESSIONS     1. Left ventricular ejection fraction, by estimation, is 70 to 75%. The  left ventricle has hyperdynamic function. The left ventricle has no  regional wall motion abnormalities. Left ventricular diastolic parameters  were normal.   2. Right ventricular systolic function is normal. The right ventricular  size is normal. There is normal pulmonary artery systolic pressure. The  estimated right ventricular systolic pressure is 09.8  mmHg.   3. The mitral valve is grossly normal. Mild mitral valve regurgitation.   4. The aortic valve has an indeterminant number of cusps and is  moderately sclerotic without stenosis. Aortic valve regurgitation is not  visualized.   5. The inferior vena cava is normal in size with greater than 50%  respiratory variability, suggesting right atrial pressure of 3 mmHg.     ASSESSMENT:    1. Other chest pain   2. Essential hypertension   3. Mixed hyperlipidemia   4. Family history of early CAD   69. Tobacco abuse      PLAN:  In order of problems listed above:  Chest pain with elevated troponins in the setting of ruptured appendix and hemicolectomy.  2D echo 11/21/2017 with normal LVEF 50% but new wall motion abnormality of the inferior wall base and mid hypokinesis.  Recommend Lexiscan Myoview  Essential hypertension  Hyperlipidemia  Family history of early CAD  Tobacco abuse 25 pack years but quit 2 months ago  Medication Adjustments/Labs and Tests Ordered: Current medicines are reviewed at length with the patient today.  Concerns regarding medicines are outlined above.  Medication changes, Labs and Tests ordered today are listed in the Patient Instructions below. There are no Patient Instructions on file for this visit.   Sumner Boast, PA-C  12/01/2019 12:07 PM    [facility] [place], [place], [place]  [address] Phone: [phone]; Fax: [phone]

## 2019-12-02 ENCOUNTER — Other Ambulatory Visit: Payer: Self-pay

## 2019-12-02 ENCOUNTER — Encounter: Payer: Self-pay | Admitting: General Surgery

## 2019-12-02 ENCOUNTER — Ambulatory Visit (INDEPENDENT_AMBULATORY_CARE_PROVIDER_SITE_OTHER): Payer: Self-pay | Admitting: General Surgery

## 2019-12-02 VITALS — BP 136/86 | HR 74 | Temp 96.9°F | Resp 16 | Ht 67.0 in | Wt 128.0 lb

## 2019-12-02 DIAGNOSIS — Z09 Encounter for follow-up examination after completed treatment for conditions other than malignant neoplasm: Secondary | ICD-10-CM

## 2019-12-03 NOTE — Progress Notes (Signed)
Subjective:     Danielle Morris  Patient here for follow-up status post ileocecectomy for an appendiceal abscess.  Patient has been doing well.  Her appetite has returned to normal.  She denies any fever or chills.  Minimal incisional pain is noted. Objective:    BP 136/86   Pulse 74   Temp (!) 96.9 F (36.1 C) (Other (Comment))   Resp 16   Ht 5\' 7"  (1.702 m)   Wt 128 lb (58.1 kg)   SpO2 99%   BMI 20.05 kg/m   General:  alert, cooperative and no distress  Abdomen soft, incision healing well.  Staples removed, Steri-Strips applied. Final pathology reviewed with patient.     Assessment:    Doing well postoperatively.    Plan:   Patient may increase activity as able.  Follow-up here as needed.

## 2019-12-06 ENCOUNTER — Ambulatory Visit: Payer: BC Managed Care – PPO | Admitting: Physician Assistant

## 2020-01-18 ENCOUNTER — Ambulatory Visit (INDEPENDENT_AMBULATORY_CARE_PROVIDER_SITE_OTHER): Payer: BC Managed Care – PPO | Admitting: Family Medicine

## 2020-01-18 ENCOUNTER — Encounter: Payer: Self-pay | Admitting: Family Medicine

## 2020-01-18 ENCOUNTER — Other Ambulatory Visit: Payer: Self-pay

## 2020-01-18 VITALS — BP 120/90 | HR 88 | Temp 97.1°F | Ht 67.0 in | Wt 134.0 lb

## 2020-01-18 DIAGNOSIS — Z720 Tobacco use: Secondary | ICD-10-CM

## 2020-01-18 DIAGNOSIS — E785 Hyperlipidemia, unspecified: Secondary | ICD-10-CM

## 2020-01-18 DIAGNOSIS — I10 Essential (primary) hypertension: Secondary | ICD-10-CM | POA: Diagnosis not present

## 2020-01-18 DIAGNOSIS — I252 Old myocardial infarction: Secondary | ICD-10-CM | POA: Diagnosis not present

## 2020-01-18 MED ORDER — PANTOPRAZOLE SODIUM 40 MG PO TBEC: 40.0000 mg | DELAYED_RELEASE_TABLET | Freq: Every day | ORAL | 3 refills | Status: DC

## 2020-01-18 MED ORDER — METOPROLOL SUCCINATE ER 25 MG PO TB24: 25.0000 mg | ORAL_TABLET | Freq: Every day | ORAL | 3 refills | Status: DC

## 2020-01-18 MED ORDER — ROSUVASTATIN CALCIUM 10 MG PO TABS: 10.0000 mg | ORAL_TABLET | Freq: Every day | ORAL | 3 refills | Status: DC

## 2020-01-18 MED ORDER — LOSARTAN POTASSIUM 50 MG PO TABS: 50.0000 mg | ORAL_TABLET | Freq: Every day | ORAL | 0 refills | Status: DC

## 2020-01-18 NOTE — Progress Notes (Signed)
Subjective:    Patient ID: Danielle Morris, female    DOB: 06/02/1956, 64 y.o.   MRN: [id number]  HPI  Patient was initially admitted to the hospital April 12 through April 16 with a ruptured appendix.  I have copied relevant portions of the discharge summary and included them below for my reference: History of present illness:  64 year old female with a history of hypertension, hyperlipidemia, tobacco abuse, GERD presenting with 2-week history of generalized abdominal pain, worsening in the right lower quadrant. She describes the pain as severe and colicky worsening with food. The patient notes that she has had loose bowel movements for about a month without any hematochezia or melena. She had some subjective chills without any fevers. She has had some loose stools, but denies any hematochezia, melena, dysuria, hematuria. She denies any headache, shortness of breath, coughing, hemoptysis. She had an episode of chest pain on 09/19/2019 lasting 5 seconds and mid left chest. She did not have any associated nausea, diaphoresis, shortness of breath. She continues to smoke 1/2 pack/day. She was at over 50-pack-year history. In the emergency department, the patient was afebrile hemodynamically stable with oxygen saturation 100% room air. BMP showed a sodium of 128. Otherwise was unremarkable. LFTs were unremarkable. WBC 11.4, hemoglobin 12.4, platelets 136,000. Lipase 35. CT of the abdomen and pelvis showed a 9.5 x 4.5 x 5.0 cm rim-enhancing fluid collection in the vicinity of the appendix close to the terminal ileum. There was right hydronephrosis with distal third of the right ureter effaced and obstructed by overlying fluid collection. The patient was started on Zosyn. General surgery was consulted to assist with management.  Hospital Course:  Intra-abdominal abscess secondary to perforated appendix -Continue Zosynfor now -General surgery consultation appreciated; will follow further  recommendations. -Advance dietfurther as per general surgery rec's -s/pdrain placementby IR on 09/22/19 -continue intermittent flushing as instructed by IR. -Instructed to maintain adequate oral hydration. -Continue as needed analgesics and antiemetics (prescription for oxycodone and Zofran provided at discharge).  Hyponatremia -Patient has chronic hyponatremia likely secondary to Zoloft -FeNa--0.17% -Acute on chronic hyponatremia secondary to volume depletion secondary to vomiting and loose stools. -Continue to maintain adequate hydration. -repeat BMET at follow-up visit.  Tobacco abuse -She has over 50-pack-year history -Cessation counseling provided. -Nicotine patch prescribed at discharge.  Essential hypertension -BP stable at discharge -losartan restarted at half usual dose -Commending follow-up with patient's vital signs (blood pressure) with further adjustment to antihypertensive regimen as required. -Heart healthy diet encouraged.  Hyperlipidemia -Patient states that she quit taking Lipitor 1 month ago -She felt there was causing her abdominal pain and diarrhea; recommend resumption at time of discharge. -repeat lipid panel dn LFT's as an outpatient.  Generalized anxiety disorder -Resume sertraline -mood stable.  Patient underwent percutaneous draining by interventional radiology and then was scheduled for corrective surgery for a later date.  She was then readmitted to the hospital June 11-17. The drain fistualized to the terminal ileum, and this did not close. We brought her in for right hemicolectomy due to this fistula. She did well post operatively but did have some degree of ileus.  She had episode of some chest pain /epgiastric pain on POD 3 and was seen by cardiology with repeat EKG showing no ACS and troponin being elevated by stabilizing out and not consistent with acute coronary syndrome.  She was worked up that day for also concern for air under her  diaphragm to ensure that she had no leak and this was  all just post operative pneumoperitoneum. The CT a/p was consistent with normal post operative findings and normal pneumoperitoneum post operatively, and she was started back on her diet.  It is likely the chest pain / epigastric pain was from some degree of ileus/ gastric ileus seen on the CT and this all resolved with a few hours of bowel rest and Pepto bismul.   Cardiology did start her on metoprolol and asked her to follow up for stress test due to repeat ECHO demonstrating a inferior wall motion that was actually on her ECHO from April but not called.   01/18/20 Patient is here today for follow-up.  She states that she has been completely released by surgery.  She has no further follow-up with them.  She has not seen the cardiologist.  Furthermore she has stopped all of her medications.  She is still taking losartan 100 mg a day.  She did quit smoking in April and has not smoked since April which I am very proud of her for.  She denies any chest pain shortness of breath or dyspnea on exertion.  However I reviewed her echocardiogram findings with the patient.  Echocardiogram showed an ejection fraction of 50%.  There was akinesis of the inferior wall and also surrounding hypokinesis.  This suggest possibly previous myocardial infarction.  There was aneurysmal dilatation of that area.  EKG also shows Q waves in lead II, III, and aVF suggesting inferior infarction.  Patient was unaware of these findings.  She is not taking an aspirin.  She is not taking metoprolol.  And she is not taking a statin. Past Medical History:  Diagnosis Date  . GAD (generalized anxiety disorder)   . HLD (hyperlipidemia)   . Hypertension   . Panic attack   . Tobacco abuse    Past Surgical History:  Procedure Laterality Date  . COLONOSCOPY WITH PROPOFOL N/A 11/02/2013   Procedure: COLONOSCOPY WITH PROPOFOL;  Surgeon: Danie Binder, MD;  Location: [facility];  Service:  Endoscopy;  Laterality: N/A;  in cecum @ 1002; cecal withdrawal time- minutes  . HEMORRHOID BANDING N/A 11/02/2013   Procedure: HEMORRHOID BANDING;  Surgeon: Danie Binder, MD;  Location: [facility];  Service: Endoscopy;  Laterality: N/A;  . HEMORRHOID SURGERY N/A 11/05/2013   Procedure: EXTENSIVE HEMORRHOIDECTOMY ;  Surgeon: Jamesetta So, MD;  Location: [facility];  Service: General;  Laterality: N/A;  . IR RADIOLOGIST EVAL & MGMT  10/05/2019  . IR RADIOLOGIST EVAL & MGMT  10/19/2019  . IR RADIOLOGIST EVAL & MGMT  11/10/2019  . PARTIAL COLECTOMY N/A 11/19/2019   Procedure: PARTIAL COLECTOMY;  Surgeon: Virl Cagey, MD;  Location: [facility];  Service: General;  Laterality: N/A;  . POLYPECTOMY N/A 11/02/2013   Procedure: POLYPECTOMY;  Surgeon: Danie Binder, MD;  Location: [facility];  Service: Endoscopy;  Laterality: N/A;  sigmoid colon, hepatic flexure, hyperplastic rectal, and rectal polyps  . TONSILLECTOMY     age  13   Current Outpatient Medications on File Prior to Visit  Medication Sig Dispense Refill  . doxylamine, Sleep, (UNISOM) 25 MG tablet Take 25 mg by mouth at bedtime as needed for sleep.    . pantoprazole (PROTONIX) 40 MG tablet Take 1 tablet (40 mg total) by mouth daily. 30 tablet 1  . acetaminophen (TYLENOL) 500 MG tablet Take 500 mg by mouth every 6 (six) hours as needed for moderate pain or headache. (Patient not taking: Reported on 01/18/2020)    . cyclobenzaprine (  FLEXERIL) 5 MG tablet Take 1 tablet (5 mg total) by mouth 3 (three) times daily as needed for muscle spasms. (Patient not taking: Reported on 01/18/2020) 30 tablet 1  . Dibucaine (NUPERCAINAL RE) Place 1 application rectally daily as needed (pain). (Patient not taking: Reported on 01/18/2020)    . docusate sodium (STOOL SOFTENER) 100 MG capsule Take 100 mg by mouth daily. (Patient not taking: Reported on 01/18/2020)    . metoprolol tartrate (LOPRESSOR) 25 MG tablet Take 1 tablet (25 mg total) by mouth 2 (two) times daily. (Patient  not taking: Reported on 01/18/2020) 60 tablet 1  . ondansetron (ZOFRAN ODT) 8 MG disintegrating tablet Take 1 tablet (8 mg total) by mouth every 8 (eight) hours as needed for nausea or vomiting. (Patient not taking: Reported on 01/18/2020) 20 tablet 1  . oxycodone (OXY-IR) 5 MG capsule Take 1-2 capsules (5-10 mg total) by mouth every 4 (four) hours as needed for pain. (Patient not taking: Reported on 01/18/2020) 20 capsule 0  . sertraline (ZOLOFT) 50 MG tablet TAKE 1 TABLET(50 MG) BY MOUTH DAILY (Patient not taking: Reported on 01/18/2020) 90 tablet 3   No current facility-administered medications on file prior to visit.   Allergies  Allergen Reactions  . Amlodipine Shortness Of Breath, Anxiety and Palpitations  . Tramadol     Rushes thru body, miserable, shaking and crying  . Hydrocodone Nausea And Vomiting  . Lisinopril Other (See Comments)    unknown  . Chantix [Varenicline] Nausea Only and Anxiety   Social History   Socioeconomic History  . Marital status: Married    Spouse name: Not on file  . Number of children: Not on file  . Years of education: Not on file  . Highest education level: Not on file  Occupational History  . Not on file  Tobacco Use  . Smoking status: Former Smoker    Packs/day: 0.50    Years: 10.00    Pack years: 5.00    Types: Cigarettes    Quit date: 09/09/2019    Years since quitting: 0.3  . Smokeless tobacco: Never Used  . Tobacco comment: uses E-cigs  Substance and Sexual Activity  . Alcohol use: No  . Drug use: No  . Sexual activity: Not on file  Other Topics Concern  . Not on file  Social History Narrative   Entered 03/2014:   Married.    2 Daughters and son in law, and 1 grandchild live with them now.    Keeps another grandchild frequently also.   Does not work outside of house.   Social Determinants of Health   Financial Resource Strain:   . Difficulty of Paying Living Expenses:   Food Insecurity:   . Worried About Charity fundraiser in  the Last Year:   . Arboriculturist in the Last Year:   Transportation Needs:   . Film/video editor (Medical):   Marland Kitchen Lack of Transportation (Non-Medical):   Physical Activity:   . Days of Exercise per Week:   . Minutes of Exercise per Session:   Stress:   . Feeling of Stress :   Social Connections:   . Frequency of Communication with Friends and Family:   . Frequency of Social Gatherings with Friends and Family:   . Attends Religious Services:   . Active Member of Clubs or Organizations:   . Attends Archivist Meetings:   Marland Kitchen Marital Status:   Intimate Partner Violence:   . Fear of  Current or Ex-Partner:   . Emotionally Abused:   Marland Kitchen Physically Abused:   . Sexually Abused:     Review of Systems  All other systems reviewed and are negative.      Objective:   Physical Exam Constitutional:      Appearance: Normal appearance. She is normal weight.  Cardiovascular:     Rate and Rhythm: Normal rate and regular rhythm.     Heart sounds: Normal heart sounds.  Pulmonary:     Effort: Pulmonary effort is normal. No respiratory distress.     Breath sounds: Normal breath sounds. No stridor. No wheezing, rhonchi or rales.  Abdominal:     General: Abdomen is flat. Bowel sounds are normal.     Palpations: Abdomen is soft.  Musculoskeletal:     Right lower leg: No edema.     Left lower leg: No edema.  Neurological:     Mental Status: She is alert.           Assessment & Plan:  Hyperlipidemia, unspecified hyperlipidemia type  Essential hypertension - Plan: losartan (COZAAR) 50 MG tablet, CBC with Differential/Platelet, COMPLETE METABOLIC PANEL WITH GFR  Tobacco abuse  History of MI (myocardial infarction)  I spent more than 30 minutes today with the patient reviewing her medical records.  I explained to the patient that implications of her echocardiogram.  She appears to have had a heart attack in the past.  She has what appears to be an inferior scar with  akinesis and surrounding hypokinesis and aneurysmal dilatation.  Cardiology has recommended wrist stratification with a stress test as an outpatient.  I strongly recommend that.  Patient refuses cardiology consultation at the present time despite my medical advice.  I have convinced her to at least optimize medical therapy including aspirin 81 mg a day now that she is cleared by surgery, adding Toprol-XL 25 mg a day to losartan 50 mg a day.  Adding Crestor 10 mg a day as she has a history of statin intolerance.  Patient will call me back when she is ready to see the cardiologist.  Again I strongly encouraged her to see cardiology as soon as possible.  Check CBC and CMP today as patient had profound postoperative anemia that needs to be followed up upon

## 2020-01-19 LAB — COMPLETE METABOLIC PANEL WITH GFR
AG Ratio: 1.3 (calc) (ref 1.0–2.5)
ALT: 14 U/L (ref 6–29)
AST: 17 U/L (ref 10–35)
Albumin: 4.8 g/dL (ref 3.6–5.1)
Alkaline phosphatase (APISO): 72 U/L (ref 37–153)
BUN: 10 mg/dL (ref 7–25)
CO2: 25 mmol/L (ref 20–32)
Calcium: 10.2 mg/dL (ref 8.6–10.4)
Chloride: 95 mmol/L — ABNORMAL LOW (ref 98–110)
Creat: 0.81 mg/dL (ref 0.50–0.99)
GFR, Est African American: 91 mL/min/{1.73_m2} (ref >=60)
GFR, Est Non African American: 78 mL/min/{1.73_m2} (ref >=60)
Globulin: 3.8 g/dL (calc) — ABNORMAL HIGH (ref 1.9–3.7)
Glucose, Bld: 108 mg/dL — ABNORMAL HIGH (ref 65–99)
Potassium: 5 mmol/L (ref 3.5–5.3)
Sodium: 132 mmol/L — ABNORMAL LOW (ref 135–146)
Total Bilirubin: 0.6 mg/dL (ref 0.2–1.2)
Total Protein: 8.6 g/dL — ABNORMAL HIGH (ref 6.1–8.1)

## 2020-01-19 LAB — CBC WITH DIFFERENTIAL/PLATELET
Absolute Monocytes: 347 cells/uL (ref 200–950)
Basophils Absolute: 32 cells/uL (ref 0–200)
Basophils Relative: 0.5 %
Eosinophils Absolute: 69 cells/uL (ref 15–500)
Eosinophils Relative: 1.1 %
HCT: 42.7 % (ref 35.0–45.0)
Hemoglobin: 14.3 g/dL (ref 11.7–15.5)
Lymphs Abs: 1027 cells/uL (ref 850–3900)
MCH: 29.8 pg (ref 27.0–33.0)
MCHC: 33.5 g/dL (ref 32.0–36.0)
MCV: 89 fL (ref 80.0–100.0)
MPV: 9.9 fL (ref 7.5–12.5)
Monocytes Relative: 5.5 %
Neutro Abs: 4826 cells/uL (ref 1500–7800)
Neutrophils Relative %: 76.6 %
Platelets: 296 10*3/uL (ref 140–400)
RBC: 4.8 10*6/uL (ref 3.80–5.10)
RDW: 12.7 % (ref 11.0–15.0)
Total Lymphocyte: 16.3 %
WBC: 6.3 10*3/uL (ref 3.8–10.8)

## 2020-01-21 ENCOUNTER — Other Ambulatory Visit: Payer: Self-pay

## 2020-02-15 ENCOUNTER — Other Ambulatory Visit: Payer: Self-pay | Admitting: Family Medicine

## 2020-02-15 DIAGNOSIS — I10 Essential (primary) hypertension: Secondary | ICD-10-CM

## 2020-06-12 ENCOUNTER — Other Ambulatory Visit: Payer: Self-pay | Admitting: Family Medicine

## 2020-06-12 DIAGNOSIS — I10 Essential (primary) hypertension: Secondary | ICD-10-CM

## 2020-10-23 ENCOUNTER — Other Ambulatory Visit: Payer: Self-pay | Admitting: Family Medicine

## 2020-10-23 DIAGNOSIS — I10 Essential (primary) hypertension: Secondary | ICD-10-CM

## 2020-10-23 MED ORDER — LOSARTAN POTASSIUM 50 MG PO TABS: ORAL_TABLET | ORAL | 11 refills | Status: DC

## 2020-10-23 NOTE — Addendum Note (Signed)
Addended by: Jenna Luo T on: 10/23/2020 01:51 PM   Modules accepted: Orders

## 2020-11-24 ENCOUNTER — Telehealth: Payer: Self-pay | Admitting: Family Medicine

## 2020-11-24 DIAGNOSIS — I10 Essential (primary) hypertension: Secondary | ICD-10-CM

## 2020-11-24 MED ORDER — LOSARTAN POTASSIUM 50 MG PO TABS: ORAL_TABLET | ORAL | 0 refills | Status: DC

## 2020-11-24 NOTE — Telephone Encounter (Signed)
Patient called to request med refill;apt scheduled for 6/23. Patient requesting either a refill or just a few pills to tie her over until the appt to prevent a lapse in doses.   Pharmacy confirmed as   [facility] [phone] - [place], [address] [address]  [address], [place] [address]  Phone:  [phone]  Fax:  [phone]  DEA #:  [id number]  Please advise patient at [phone].

## 2020-11-24 NOTE — Telephone Encounter (Signed)
Refill was requested for losartan (COZAAR) 50 MG tablet [[id number]]

## 2020-11-24 NOTE — Telephone Encounter (Signed)
Request verified as Losartan.   Prescription sent to pharmacy.

## 2020-11-30 ENCOUNTER — Ambulatory Visit (INDEPENDENT_AMBULATORY_CARE_PROVIDER_SITE_OTHER): Payer: No Typology Code available for payment source | Admitting: Family Medicine

## 2020-11-30 ENCOUNTER — Other Ambulatory Visit: Payer: Self-pay

## 2020-11-30 ENCOUNTER — Encounter: Payer: Self-pay | Admitting: Family Medicine

## 2020-11-30 VITALS — BP 144/90 | HR 96 | Temp 98.3°F | Resp 16 | Ht 67.0 in | Wt 171.0 lb

## 2020-11-30 DIAGNOSIS — I10 Essential (primary) hypertension: Secondary | ICD-10-CM

## 2020-11-30 NOTE — Progress Notes (Signed)
Subjective:    Patient ID: Danielle Morris, female    DOB: Dec 15, 1955, 65 y.o.   MRN: [id number]  HPI  Patient was initially admitted to the hospital April 12 through April 16 with a ruptured appendix.  I have copied relevant portions of the discharge summary and included them below for my reference: History of present illness:  65 year old female with a history of hypertension, hyperlipidemia, tobacco abuse, GERD presenting with 2-week history of generalized abdominal pain, worsening in the right lower quadrant.  She describes the pain as severe and colicky worsening with food.  The patient notes that she has had loose bowel movements for about a month without any hematochezia or melena.  She had some subjective chills without any fevers.  She has had some loose stools, but denies any hematochezia, melena, dysuria, hematuria.  She denies any headache, shortness of breath, coughing, hemoptysis.  She had an episode of chest pain on 09/19/2019 lasting 5 seconds and mid left chest.  She did not have any associated nausea, diaphoresis, shortness of breath.  She continues to smoke 1/2 pack/day.  She was at over 50-pack-year history. In the emergency department, the patient was afebrile hemodynamically stable with oxygen saturation 100% room air.  BMP showed a sodium of 128.  Otherwise was unremarkable.  LFTs were unremarkable.  WBC 11.4, hemoglobin 12.4, platelets 136,000.  Lipase 35.  CT of the abdomen and pelvis showed a 9.5 x 4.5 x 5.0 cm rim-enhancing fluid collection in the vicinity of the appendix close to the terminal ileum.  There was right hydronephrosis with distal third of the right ureter effaced and obstructed by overlying fluid collection.  The patient was started on Zosyn.  General surgery was consulted to assist with management.   Hospital Course:  Intra-abdominal abscess secondary to perforated appendix -Continue Zosyn for now -General surgery consultation appreciated; will follow further  recommendations. -Advance diet further as per general surgery rec's -s/p drain placement by IR on 09/22/19 -continue intermittent flushing as instructed by IR. -Instructed to maintain adequate oral hydration.  -Continue as needed analgesics and antiemetics (prescription for oxycodone and Zofran provided at discharge).   Hyponatremia -Patient has chronic hyponatremia likely secondary to Zoloft -FeNa--0.17% -Acute on chronic hyponatremia secondary to volume depletion secondary to vomiting and loose stools. -Continue to maintain adequate hydration. -repeat BMET at follow-up visit.   Tobacco abuse -She has over 50-pack-year history -Cessation counseling provided. -Nicotine patch prescribed at discharge.   Essential hypertension -BP stable at discharge -losartan restarted at half usual dose -Commending follow-up with patient's vital signs (blood pressure) with further adjustment to antihypertensive regimen as required. -Heart healthy diet encouraged.   Hyperlipidemia -Patient states that she quit taking Lipitor 1 month ago -She felt there was causing her abdominal pain and diarrhea; recommend resumption at time of discharge. -repeat lipid panel dn LFT's as an outpatient.   Generalized anxiety disorder -Resume sertraline -mood stable.  Patient underwent percutaneous draining by interventional radiology and then was scheduled for corrective surgery for a later date.  She was then readmitted to the hospital June 11-17. The drain fistualized to the terminal ileum, and this did not close. We brought her in for right hemicolectomy due to this fistula. She did well post operatively but did have some degree of ileus.  She had episode of some chest pain /epgiastric pain on POD 3 and was seen by cardiology with repeat EKG showing no ACS and troponin being elevated by stabilizing out and not consistent with  acute coronary syndrome.  She was worked up that day for also concern for air under her  diaphragm to ensure that she had no leak and this was all just post operative pneumoperitoneum. The CT a/p was consistent with normal post operative findings and normal pneumoperitoneum post operatively, and she was started back on her diet.  It is likely the chest pain / epigastric pain was from some degree of ileus/ gastric ileus seen on the CT and this all resolved with a few hours of bowel rest and Pepto bismul.    Cardiology did start her on metoprolol and asked her to follow up for stress test due to repeat ECHO demonstrating a inferior wall motion that was actually on her ECHO from April but not called.   01/18/20 Patient is here today for follow-up.  She states that she has been completely released by surgery.  She has no further follow-up with them.  She has not seen the cardiologist.  Furthermore she has stopped all of her medications.  She is still taking losartan 100 mg a day.  She did quit smoking in April and has not smoked since April which I am very proud of her for.  She denies any chest pain shortness of breath or dyspnea on exertion.  However I reviewed her echocardiogram findings with the patient.  Echocardiogram showed an ejection fraction of 50%.  There was akinesis of the inferior wall and also surrounding hypokinesis.  This suggest possibly previous myocardial infarction.  There was aneurysmal dilatation of that area.  EKG also shows Q waves in lead II, III, and aVF suggesting inferior infarction.  Patient was unaware of these findings.  She is not taking an aspirin.  She is not taking metoprolol.  And she is not taking a statin  11/30/20 Patient states that she continues to smoke intermittently.  She denies any chest pain or shortness of breath.  Her blood pressure today is elevated however she states that this is because she is anxious.  She is very afraid of doctors offices.  She states that her blood pressure at home is usually well controlled.  She is still not taking a statin.   After I saw her last, her husband lost his job.  As result, financially, they were unable to afford medication.  Therefore she is only on her blood pressure medicine.  At the present time, she declines mammogram and other preventative care because of cost restrictions.  Otherwise she is doing well with no concerns.  She denies any abdominal pain or melena or hematochezia Past Medical History:  Diagnosis Date   GAD (generalized anxiety disorder)    GERD (gastroesophageal reflux disease)    HLD (hyperlipidemia)    Hypertension    Myocardial infarction ([place])    akinesis on echo with abnormal EKG-refused cardiology follow up (8/21)   Panic attack    Tobacco abuse    Past Surgical History:  Procedure Laterality Date   APPENDECTOMY     COLON SURGERY     COLONOSCOPY WITH PROPOFOL N/A 11/02/2013   Procedure: COLONOSCOPY WITH PROPOFOL;  Surgeon: Danie Binder, MD;  Location: [facility];  Service: Endoscopy;  Laterality: N/A;  in cecum @ 1002; cecal withdrawal time- minutes   HEMORRHOID BANDING N/A 11/02/2013   Procedure: HEMORRHOID BANDING;  Surgeon: Danie Binder, MD;  Location: [facility];  Service: Endoscopy;  Laterality: N/A;   HEMORRHOID SURGERY N/A 11/05/2013   Procedure: EXTENSIVE HEMORRHOIDECTOMY ;  Surgeon: Jamesetta So, MD;  Location: [facility];  Service: General;  Laterality: N/A;   IR RADIOLOGIST EVAL & MGMT  10/05/2019   IR RADIOLOGIST EVAL & MGMT  10/19/2019   IR RADIOLOGIST EVAL & MGMT  11/10/2019   PARTIAL COLECTOMY N/A 11/19/2019   Procedure: PARTIAL COLECTOMY;  Surgeon: Virl Cagey, MD;  Location: [facility];  Service: General;  Laterality: N/A;   POLYPECTOMY N/A 11/02/2013   Procedure: POLYPECTOMY;  Surgeon: Danie Binder, MD;  Location: [facility];  Service: Endoscopy;  Laterality: N/A;  sigmoid colon, hepatic flexure, hyperplastic rectal, and rectal polyps   TONSILLECTOMY     age  13   Current Outpatient Medications on File Prior to Visit  Medication Sig Dispense Refill   docusate  sodium (COLACE) 100 MG capsule Take 100 mg by mouth 2 (two) times daily.     doxylamine, Sleep, (UNISOM) 25 MG tablet Take 25 mg by mouth at bedtime as needed for sleep.     losartan (COZAAR) 50 MG tablet TAKE 1 TABLET(50 MG) BY MOUTH DAILY 90 tablet 0   No current facility-administered medications on file prior to visit.   Allergies  Allergen Reactions   Amlodipine Shortness Of Breath, Anxiety and Palpitations   Tramadol     Rushes thru body, miserable, shaking and crying   Hydrocodone Nausea And Vomiting   Lisinopril Other (See Comments)    unknown   Chantix [Varenicline] Nausea Only and Anxiety   Social History   Socioeconomic History   Marital status: Married    Spouse name: Not on file   Number of children: Not on file   Years of education: Not on file   Highest education level: Not on file  Occupational History   Not on file  Tobacco Use   Smoking status: Former    Packs/day: 0.50    Years: 10.00    Pack years: 5.00    Types: Cigarettes    Quit date: 09/09/2019    Years since quitting: 1.2   Smokeless tobacco: Never   Tobacco comments:    uses E-cigs  Substance and Sexual Activity   Alcohol use: No   Drug use: No   Sexual activity: Not on file  Other Topics Concern   Not on file  Social History Narrative   Entered 03/2014:   Married.    2 Daughters and son in law, and 1 grandchild live with them now.    Keeps another grandchild frequently also.   Does not work outside of house.   Social Determinants of Health   Financial Resource Strain: Not on file  Food Insecurity: Not on file  Transportation Needs: Not on file  Physical Activity: Not on file  Stress: Not on file  Social Connections: Not on file  Intimate Partner Violence: Not on file    Review of Systems  All other systems reviewed and are negative.     Objective:   Physical Exam Constitutional:      Appearance: Normal appearance. She is normal weight.  Cardiovascular:     Rate and Rhythm:  Normal rate and regular rhythm.     Heart sounds: Normal heart sounds.  Pulmonary:     Effort: Pulmonary effort is normal. No respiratory distress.     Breath sounds: Normal breath sounds. No stridor. No wheezing, rhonchi or rales.  Abdominal:     General: Abdomen is flat. Bowel sounds are normal.     Palpations: Abdomen is soft.  Musculoskeletal:     Right lower leg: No  edema.     Left lower leg: No edema.  Neurological:     Mental Status: She is alert.          Assessment & Plan:  Benign essential HTN - Plan: CBC with Differential/Platelet, COMPLETE METABOLIC PANEL WITH GFR, Lipid panel  Patient's blood pressure today is elevated however her home blood pressures are much better controlled.  She is asymptomatic regarding chest pain or shortness of breath or dyspnea on exertion.  I will check a CMP and a lipid panel.  I would like to see her LDL cholesterol less than 70.  Patient states that she could likely afford the medication now with her insurance.  Therefore we will check her cholesterol and if greater than 70 I will start a statin.  I would also recommend an aspirin 81 mg a day but I would like to see the results of her lab work first.  She declines a mammogram

## 2020-12-01 LAB — LIPID PANEL
Cholesterol: 274 mg/dL — ABNORMAL HIGH (ref <200)
HDL: 39 mg/dL — ABNORMAL LOW (ref >=50)
LDL Cholesterol (Calc): 199 mg/dL (calc) — ABNORMAL HIGH
Non-HDL Cholesterol (Calc): 235 mg/dL (calc) — ABNORMAL HIGH (ref <130)
Total CHOL/HDL Ratio: 7 (calc) — ABNORMAL HIGH (ref <5.0)
Triglycerides: 185 mg/dL — ABNORMAL HIGH (ref <150)

## 2020-12-01 LAB — CBC WITH DIFFERENTIAL/PLATELET
Absolute Monocytes: 379 cells/uL (ref 200–950)
Basophils Absolute: 38 cells/uL (ref 0–200)
Basophils Relative: 0.8 %
Eosinophils Absolute: 221 cells/uL (ref 15–500)
Eosinophils Relative: 4.6 %
HCT: 43.8 % (ref 35.0–45.0)
Hemoglobin: 15.4 g/dL (ref 11.7–15.5)
Lymphs Abs: 1469 cells/uL (ref 850–3900)
MCH: 31.2 pg (ref 27.0–33.0)
MCHC: 35.2 g/dL (ref 32.0–36.0)
MCV: 88.7 fL (ref 80.0–100.0)
MPV: 10.6 fL (ref 7.5–12.5)
Monocytes Relative: 7.9 %
Neutro Abs: 2693 cells/uL (ref 1500–7800)
Neutrophils Relative %: 56.1 %
Platelets: 286 10*3/uL (ref 140–400)
RBC: 4.94 10*6/uL (ref 3.80–5.10)
RDW: 13.1 % (ref 11.0–15.0)
Total Lymphocyte: 30.6 %
WBC: 4.8 10*3/uL (ref 3.8–10.8)

## 2020-12-01 LAB — COMPLETE METABOLIC PANEL WITH GFR
AG Ratio: 1.2 (calc) (ref 1.0–2.5)
ALT: 16 U/L (ref 6–29)
AST: 18 U/L (ref 10–35)
Albumin: 4.5 g/dL (ref 3.6–5.1)
Alkaline phosphatase (APISO): 82 U/L (ref 37–153)
BUN: 13 mg/dL (ref 7–25)
CO2: 27 mmol/L (ref 20–32)
Calcium: 9.7 mg/dL (ref 8.6–10.4)
Chloride: 100 mmol/L (ref 98–110)
Creat: 0.92 mg/dL (ref 0.50–0.99)
GFR, Est African American: 77 mL/min/{1.73_m2} (ref >=60)
GFR, Est Non African American: 67 mL/min/{1.73_m2} (ref >=60)
Globulin: 3.9 g/dL (calc) — ABNORMAL HIGH (ref 1.9–3.7)
Glucose, Bld: 117 mg/dL — ABNORMAL HIGH (ref 65–99)
Potassium: 5 mmol/L (ref 3.5–5.3)
Sodium: 134 mmol/L — ABNORMAL LOW (ref 135–146)
Total Bilirubin: 0.6 mg/dL (ref 0.2–1.2)
Total Protein: 8.4 g/dL — ABNORMAL HIGH (ref 6.1–8.1)

## 2020-12-04 ENCOUNTER — Other Ambulatory Visit: Payer: Self-pay | Admitting: *Deleted

## 2020-12-04 DIAGNOSIS — E785 Hyperlipidemia, unspecified: Secondary | ICD-10-CM

## 2020-12-04 DIAGNOSIS — I10 Essential (primary) hypertension: Secondary | ICD-10-CM

## 2020-12-04 MED ORDER — ASPIRIN 81 MG PO TBEC: 81.0000 mg | DELAYED_RELEASE_TABLET | Freq: Every day | ORAL | 12 refills | Status: AC

## 2020-12-04 MED ORDER — ROSUVASTATIN CALCIUM 20 MG PO TABS
20.0000 mg | ORAL_TABLET | Freq: Every day | ORAL | 3 refills | Status: DC
Start: 2020-12-04 — End: 2021-11-19

## 2021-03-06 ENCOUNTER — Other Ambulatory Visit: Payer: Self-pay

## 2021-03-06 ENCOUNTER — Ambulatory Visit: Payer: No Typology Code available for payment source | Admitting: Family Medicine

## 2021-03-06 ENCOUNTER — Encounter: Payer: Self-pay | Admitting: Family Medicine

## 2021-03-06 VITALS — BP 138/84 | HR 88 | Temp 97.9°F | Resp 16 | Ht 67.0 in | Wt 170.0 lb

## 2021-03-06 DIAGNOSIS — E7800 Pure hypercholesterolemia, unspecified: Secondary | ICD-10-CM

## 2021-03-06 NOTE — Progress Notes (Signed)
Subjective:    Patient ID: Danielle Morris, female    DOB: 31-Jan-1956, 65 y.o.   MRN: [id number]  Patient is here today to recheck her cholesterol.  She is currently on Crestor 20 mg a day.  Please see her lab work earlier this year but her LDL cholesterol was 199!Marland Kitchen  She denies any myalgias or right upper quadrant pain.  She is consistently taking the Crestor.  She denies any chest pain shortness of breath or dyspnea on exertion.  She is not smoking.  Blood pressure today is well controlled however she is not taking her aspirin. Past Medical History:  Diagnosis Date   GAD (generalized anxiety disorder)    GERD (gastroesophageal reflux disease)    HLD (hyperlipidemia)    Hypertension    Myocardial infarction ([place])    akinesis on echo with abnormal EKG-refused cardiology follow up (8/21)   Panic attack    Tobacco abuse    Past Surgical History:  Procedure Laterality Date   APPENDECTOMY     COLON SURGERY     COLONOSCOPY WITH PROPOFOL N/A 11/02/2013   Procedure: COLONOSCOPY WITH PROPOFOL;  Surgeon: Danie Binder, MD;  Location: [facility];  Service: Endoscopy;  Laterality: N/A;  in cecum @ 1002; cecal withdrawal time- minutes   HEMORRHOID BANDING N/A 11/02/2013   Procedure: HEMORRHOID BANDING;  Surgeon: Danie Binder, MD;  Location: [facility];  Service: Endoscopy;  Laterality: N/A;   HEMORRHOID SURGERY N/A 11/05/2013   Procedure: EXTENSIVE HEMORRHOIDECTOMY ;  Surgeon: Jamesetta So, MD;  Location: [facility];  Service: General;  Laterality: N/A;   IR RADIOLOGIST EVAL & MGMT  10/05/2019   IR RADIOLOGIST EVAL & MGMT  10/19/2019   IR RADIOLOGIST EVAL & MGMT  11/10/2019   PARTIAL COLECTOMY N/A 11/19/2019   Procedure: PARTIAL COLECTOMY;  Surgeon: Virl Cagey, MD;  Location: [facility];  Service: General;  Laterality: N/A;   POLYPECTOMY N/A 11/02/2013   Procedure: POLYPECTOMY;  Surgeon: Danie Binder, MD;  Location: [facility];  Service: Endoscopy;  Laterality: N/A;  sigmoid colon, hepatic flexure, hyperplastic  rectal, and rectal polyps   TONSILLECTOMY     age  42   Current Outpatient Medications on File Prior to Visit  Medication Sig Dispense Refill   docusate sodium (COLACE) 100 MG capsule Take 100 mg by mouth 2 (two) times daily.     doxylamine, Sleep, (UNISOM) 25 MG tablet Take 25 mg by mouth at bedtime as needed for sleep.     losartan (COZAAR) 50 MG tablet TAKE 1 TABLET(50 MG) BY MOUTH DAILY 90 tablet 0   rosuvastatin (CRESTOR) 20 MG tablet Take 1 tablet (20 mg total) by mouth daily. 90 tablet 3   aspirin 81 MG EC tablet Take 1 tablet (81 mg total) by mouth daily. Swallow whole. (Patient not taking: Reported on 03/06/2021) 30 tablet 12   No current facility-administered medications on file prior to visit.   Allergies  Allergen Reactions   Amlodipine Shortness Of Breath, Anxiety and Palpitations   Tramadol     Rushes thru body, miserable, shaking and crying   Hydrocodone Nausea And Vomiting   Lisinopril Other (See Comments)    unknown   Chantix [Varenicline] Nausea Only and Anxiety   Social History   Socioeconomic History   Marital status: Married    Spouse name: Not on file   Number of children: Not on file   Years of education: Not on file   Highest education level: Not  on file  Occupational History   Not on file  Tobacco Use   Smoking status: Former    Packs/day: 0.50    Years: 10.00    Pack years: 5.00    Types: Cigarettes    Quit date: 09/09/2019    Years since quitting: 1.4   Smokeless tobacco: Never   Tobacco comments:    uses E-cigs  Substance and Sexual Activity   Alcohol use: No   Drug use: No   Sexual activity: Not on file  Other Topics Concern   Not on file  Social History Narrative   Entered 03/2014:   Married.    2 Daughters and son in law, and 1 grandchild live with them now.    Keeps another grandchild frequently also.   Does not work outside of house.   Social Determinants of Health   Financial Resource Strain: Not on file  Food Insecurity: Not  on file  Transportation Needs: Not on file  Physical Activity: Not on file  Stress: Not on file  Social Connections: Not on file  Intimate Partner Violence: Not on file     Review of Systems  All other systems reviewed and are negative.     Objective:   Physical Exam Vitals reviewed.  Constitutional:      Appearance: Normal appearance.  Cardiovascular:     Rate and Rhythm: Normal rate and regular rhythm.     Pulses: Normal pulses.     Heart sounds: Normal heart sounds. No murmur heard.   No friction rub. No gallop.  Pulmonary:     Effort: Pulmonary effort is normal.     Breath sounds: No stridor. Decreased breath sounds and wheezing present. No rhonchi or rales.  Abdominal:     General: Abdomen is flat. Bowel sounds are normal.     Palpations: Abdomen is soft.  Musculoskeletal:     Right lower leg: No edema.     Left lower leg: No edema.  Neurological:     Mental Status: She is alert.          Assessment & Plan:  Pure hypercholesterolemia - Plan: COMPLETE METABOLIC PANEL WITH GFR, Lipid panel Recheck CMP and fasting lipid panel.  Goal LDL cholesterol will be less than 130 which would be more than 30% drop in her LDL cholesterol.  Encouraged her to take an 81 mg aspirin daily given her history of myocardial infarction.  Blood pressure today is well controlled.  She refuses a flu shot

## 2021-03-07 LAB — LIPID PANEL
Cholesterol: 142 mg/dL (ref <200)
HDL: 43 mg/dL — ABNORMAL LOW (ref >=50)
LDL Cholesterol (Calc): 78 mg/dL (calc)
Non-HDL Cholesterol (Calc): 99 mg/dL (calc) (ref <130)
Total CHOL/HDL Ratio: 3.3 (calc) (ref <5.0)
Triglycerides: 131 mg/dL (ref <150)

## 2021-03-07 LAB — COMPLETE METABOLIC PANEL WITH GFR
AG Ratio: 1.2 (calc) (ref 1.0–2.5)
ALT: 15 U/L (ref 6–29)
AST: 18 U/L (ref 10–35)
Albumin: 4.5 g/dL (ref 3.6–5.1)
Alkaline phosphatase (APISO): 86 U/L (ref 37–153)
BUN: 14 mg/dL (ref 7–25)
CO2: 26 mmol/L (ref 20–32)
Calcium: 9.9 mg/dL (ref 8.6–10.4)
Chloride: 104 mmol/L (ref 98–110)
Creat: 0.84 mg/dL (ref 0.50–1.05)
Globulin: 3.7 g/dL (calc) (ref 1.9–3.7)
Glucose, Bld: 91 mg/dL (ref 65–99)
Potassium: 5 mmol/L (ref 3.5–5.3)
Sodium: 137 mmol/L (ref 135–146)
Total Bilirubin: 0.7 mg/dL (ref 0.2–1.2)
Total Protein: 8.2 g/dL — ABNORMAL HIGH (ref 6.1–8.1)
eGFR: 79 mL/min/{1.73_m2} (ref >=60)

## 2021-03-07 LAB — EXTRA LAV TOP TUBE

## 2021-11-17 ENCOUNTER — Other Ambulatory Visit: Payer: Self-pay | Admitting: Family Medicine

## 2021-11-19 NOTE — Telephone Encounter (Signed)
Requested Prescriptions  Pending Prescriptions Disp Refills  . rosuvastatin (CRESTOR) 20 MG tablet [Pharmacy Med Name: ROSUVASTATIN '20MG'$  TABLETS] 90 tablet 0    Sig: TAKE 1 TABLET(20 MG) BY MOUTH DAILY     Cardiovascular:  Antilipid - Statins 2 Failed - 11/19/2021  9:24 AM      Failed - Lipid Panel in normal range within the last 12 months    Cholesterol  Date Value Ref Range Status  03/06/2021 142 <200 mg/dL Final   LDL Cholesterol (Calc)  Date Value Ref Range Status  03/06/2021 78 mg/dL (calc) Final    Comment:    Reference range: <100 . Desirable range <100 mg/dL for primary prevention;   <70 mg/dL for patients with CHD or diabetic patients  with > or = 2 CHD risk factors. Marland Kitchen LDL-C is now calculated using the Martin-Hopkins  calculation, which is a validated novel method providing  better accuracy than the Friedewald equation in the  estimation of LDL-C.  Cresenciano Genre et al. Annamaria Helling. 3500;938(18): 2061-2068  (http://education.[facility].com/faq/FAQ164)    HDL  Date Value Ref Range Status  03/06/2021 43 (L) > OR = 50 mg/dL Final   Triglycerides  Date Value Ref Range Status  03/06/2021 131 <150 mg/dL Final         Passed - Cr in normal range and within 360 days    Creat  Date Value Ref Range Status  03/06/2021 0.84 0.50 - 1.05 mg/dL Final   Creatinine, Urine  Date Value Ref Range Status  09/20/2019 43.73 mg/dL Final    Comment:    Performed at [facility], [address], [place], [place] [address]         Passed - Patient is not pregnant      Passed - Valid encounter within last 12 months    Recent Outpatient Visits          8 months ago Pure hypercholesterolemia   [place] Schaumann, Cammie Mcgee, MD   11 months ago Benign essential HTN   [place] Pickard, Cammie Mcgee, MD   1 year ago Hyperlipidemia, unspecified hyperlipidemia type   [place] Pickard, Cammie Mcgee, MD   2 years ago Essential hypertension    [place], Cammie Mcgee, MD   2 years ago Elevated cholesterol   [place] Pickard, Cammie Mcgee, MD

## 2022-02-13 ENCOUNTER — Other Ambulatory Visit: Payer: Self-pay | Admitting: Family Medicine

## 2022-02-13 DIAGNOSIS — I10 Essential (primary) hypertension: Secondary | ICD-10-CM

## 2022-02-14 ENCOUNTER — Other Ambulatory Visit: Payer: Self-pay | Admitting: Family Medicine

## 2022-02-14 DIAGNOSIS — I10 Essential (primary) hypertension: Secondary | ICD-10-CM

## 2022-02-14 NOTE — Telephone Encounter (Signed)
Attempted to call patient to schedule appointment- left message to call office- courtesy RF #[phone] given Requested Prescriptions  Pending Prescriptions Disp Refills  . losartan (COZAAR) 50 MG tablet [Pharmacy Med Name: LOSARTAN '50MG'$  TABLETS] 30 tablet 0    Sig: TAKE 1 TABLET(50 MG) BY MOUTH DAILY     Cardiovascular:  Angiotensin Receptor Blockers Failed - 02/13/2022  6:32 AM      Failed - Cr in normal range and within 180 days    Creat  Date Value Ref Range Status  03/06/2021 0.84 0.50 - 1.05 mg/dL Final   Creatinine, Urine  Date Value Ref Range Status  09/20/2019 43.73 mg/dL Final    Comment:    Performed at [facility], [address], [place], [place] [address]         Failed - K in normal range and within 180 days    Potassium  Date Value Ref Range Status  03/06/2021 5.0 3.5 - 5.3 mmol/L Final         Failed - Valid encounter within last 6 months    Recent Outpatient Visits          11 months ago Pure hypercholesterolemia   [place] Susy Frizzle, MD   1 year ago Benign essential HTN   [place] Susy Frizzle, MD   2 years ago Hyperlipidemia, unspecified hyperlipidemia type   [place] Pickard, Cammie Mcgee, MD   2 years ago Essential hypertension   [place], Cammie Mcgee, MD   2 years ago Elevated cholesterol   [place], Cammie Mcgee, MD             Passed - Patient is not pregnant      Passed - Last BP in normal range    BP Readings from Last 1 Encounters:  03/06/21 138/84

## 2022-02-26 ENCOUNTER — Ambulatory Visit (INDEPENDENT_AMBULATORY_CARE_PROVIDER_SITE_OTHER): Payer: Self-pay | Admitting: Family Medicine

## 2022-02-26 VITALS — BP 128/72 | HR 77 | Temp 97.7°F | Ht 67.0 in | Wt 165.6 lb

## 2022-02-26 DIAGNOSIS — E7800 Pure hypercholesterolemia, unspecified: Secondary | ICD-10-CM

## 2022-02-26 NOTE — Progress Notes (Signed)
Subjective:    Patient ID: Danielle Morris, female    DOB: 02-Nov-1955, 66 y.o.   MRN: [id number] Patient is still taking her rosuvastatin.  She denies any myalgias or right upper quadrant pain.  She denies any chest pain or shortness of breath.  Unfortunately she continues to smoke.  However she is trying to reduce her quantity of smoking.  She is overdue for mammogram as well as colonoscopy however she defers these at the present time because she does not have any insurance. Past Medical History:  Diagnosis Date   GAD (generalized anxiety disorder)    GERD (gastroesophageal reflux disease)    HLD (hyperlipidemia)    Hypertension    Myocardial infarction ([place])    akinesis on echo with abnormal EKG-refused cardiology follow up (8/21)   Panic attack    Tobacco abuse    Past Surgical History:  Procedure Laterality Date   APPENDECTOMY     COLON SURGERY     COLONOSCOPY WITH PROPOFOL N/A 11/02/2013   Procedure: COLONOSCOPY WITH PROPOFOL;  Surgeon: Danie Binder, MD;  Location: [facility];  Service: Endoscopy;  Laterality: N/A;  in cecum @ 1002; cecal withdrawal time- minutes   HEMORRHOID BANDING N/A 11/02/2013   Procedure: HEMORRHOID BANDING;  Surgeon: Danie Binder, MD;  Location: [facility];  Service: Endoscopy;  Laterality: N/A;   HEMORRHOID SURGERY N/A 11/05/2013   Procedure: EXTENSIVE HEMORRHOIDECTOMY ;  Surgeon: Jamesetta So, MD;  Location: [facility];  Service: General;  Laterality: N/A;   IR RADIOLOGIST EVAL & MGMT  10/05/2019   IR RADIOLOGIST EVAL & MGMT  10/19/2019   IR RADIOLOGIST EVAL & MGMT  11/10/2019   PARTIAL COLECTOMY N/A 11/19/2019   Procedure: PARTIAL COLECTOMY;  Surgeon: Virl Cagey, MD;  Location: [facility];  Service: General;  Laterality: N/A;   POLYPECTOMY N/A 11/02/2013   Procedure: POLYPECTOMY;  Surgeon: Danie Binder, MD;  Location: [facility];  Service: Endoscopy;  Laterality: N/A;  sigmoid colon, hepatic flexure, hyperplastic rectal, and rectal polyps   TONSILLECTOMY     age   10   Current Outpatient Medications on File Prior to Visit  Medication Sig Dispense Refill   docusate sodium (COLACE) 100 MG capsule Take 100 mg by mouth 2 (two) times daily.     doxylamine, Sleep, (UNISOM) 25 MG tablet Take 25 mg by mouth at bedtime as needed for sleep.     losartan (COZAAR) 50 MG tablet TAKE 1 TABLET(50 MG) BY MOUTH DAILY 30 tablet 0   rosuvastatin (CRESTOR) 20 MG tablet TAKE 1 TABLET(20 MG) BY MOUTH DAILY 90 tablet 0   aspirin 81 MG EC tablet Take 1 tablet (81 mg total) by mouth daily. Swallow whole. (Patient not taking: Reported on 03/06/2021) 30 tablet 12   No current facility-administered medications on file prior to visit.   Allergies  Allergen Reactions   Amlodipine Shortness Of Breath, Anxiety and Palpitations   Tramadol     Rushes thru body, miserable, shaking and crying   Hydrocodone Nausea And Vomiting   Lisinopril Other (See Comments)    unknown   Chantix [Varenicline] Nausea Only and Anxiety   Social History   Socioeconomic History   Marital status: Married    Spouse name: Not on file   Number of children: Not on file   Years of education: Not on file   Highest education level: Not on file  Occupational History   Not on file  Tobacco Use   Smoking status:  Former    Packs/day: 0.50    Years: 10.00    Total pack years: 5.00    Types: Cigarettes    Quit date: 09/09/2019    Years since quitting: 2.4   Smokeless tobacco: Never   Tobacco comments:    uses E-cigs  Substance and Sexual Activity   Alcohol use: No   Drug use: No   Sexual activity: Not on file  Other Topics Concern   Not on file  Social History Narrative   Entered 03/2014:   Married.    2 Daughters and son in law, and 1 grandchild live with them now.    Keeps another grandchild frequently also.   Does not work outside of house.   Social Determinants of Health   Financial Resource Strain: Not on file  Food Insecurity: Not on file  Transportation Needs: Not on file  Physical  Activity: Not on file  Stress: Not on file  Social Connections: Not on file  Intimate Partner Violence: Not on file     Review of Systems  All other systems reviewed and are negative.      Objective:   Physical Exam Vitals reviewed.  Constitutional:      Appearance: Normal appearance.  Cardiovascular:     Rate and Rhythm: Normal rate and regular rhythm.     Pulses: Normal pulses.     Heart sounds: Normal heart sounds. No murmur heard.    No friction rub. No gallop.  Pulmonary:     Effort: Pulmonary effort is normal.     Breath sounds: No stridor. Decreased breath sounds and wheezing present. No rhonchi or rales.  Abdominal:     General: Abdomen is flat. Bowel sounds are normal.     Palpations: Abdomen is soft.  Musculoskeletal:     Right lower leg: No edema.     Left lower leg: No edema.  Neurological:     Mental Status: She is alert.           Assessment & Plan:  Pure hypercholesterolemia - Plan: Lipid panel, COMPLETE METABOLIC PANEL WITH GFR Patient's blood pressure today is excellent at 172.  Continue to encourage her to quit smoking.  Check CMP and lipid panel today.  Goal LDL cholesterol is less than 70 given her history of coronary artery disease.  Recommended a mammogram and a colonoscopy however the patient declines due to cost

## 2022-02-27 LAB — COMPLETE METABOLIC PANEL WITH GFR
AG Ratio: 1.1 (calc) (ref 1.0–2.5)
ALT: 15 U/L (ref 6–29)
AST: 18 U/L (ref 10–35)
Albumin: 4.8 g/dL (ref 3.6–5.1)
Alkaline phosphatase (APISO): 78 U/L (ref 37–153)
BUN: 14 mg/dL (ref 7–25)
CO2: 26 mmol/L (ref 20–32)
Calcium: 10.4 mg/dL (ref 8.6–10.4)
Chloride: 102 mmol/L (ref 98–110)
Creat: 0.91 mg/dL (ref 0.50–1.05)
Globulin: 4.2 g/dL (calc) — ABNORMAL HIGH (ref 1.9–3.7)
Glucose, Bld: 98 mg/dL (ref 65–99)
Potassium: 4.9 mmol/L (ref 3.5–5.3)
Sodium: 137 mmol/L (ref 135–146)
Total Bilirubin: 0.7 mg/dL (ref 0.2–1.2)
Total Protein: 9 g/dL — ABNORMAL HIGH (ref 6.1–8.1)
eGFR: 71 mL/min/{1.73_m2} (ref >=60)

## 2022-02-27 LAB — LIPID PANEL
Cholesterol: 135 mg/dL (ref <200)
HDL: 44 mg/dL — ABNORMAL LOW (ref >=50)
LDL Cholesterol (Calc): 71 mg/dL (calc)
Non-HDL Cholesterol (Calc): 91 mg/dL (calc) (ref <130)
Total CHOL/HDL Ratio: 3.1 (calc) (ref <5.0)
Triglycerides: 113 mg/dL (ref <150)

## 2022-03-05 ENCOUNTER — Other Ambulatory Visit: Payer: Self-pay

## 2022-03-05 DIAGNOSIS — E785 Hyperlipidemia, unspecified: Secondary | ICD-10-CM

## 2022-03-05 DIAGNOSIS — E7800 Pure hypercholesterolemia, unspecified: Secondary | ICD-10-CM

## 2022-03-05 DIAGNOSIS — R779 Abnormality of plasma protein, unspecified: Secondary | ICD-10-CM

## 2022-03-08 LAB — PROTEIN ELECTROPHORESIS, SERUM
Albumin ELP: 4.2 g/dL (ref 3.8–4.8)
Alpha 1: 0.3 g/dL (ref 0.2–0.3)
Alpha 2: 1.1 g/dL — ABNORMAL HIGH (ref 0.5–0.9)
Beta 2: 0.3 g/dL (ref 0.2–0.5)
Beta Globulin: 0.4 g/dL (ref 0.4–0.6)
Gamma Globulin: 1.8 g/dL — ABNORMAL HIGH (ref 0.8–1.7)
Total Protein: 8.1 g/dL (ref 6.1–8.1)

## 2022-03-14 IMAGING — CT CT ABD-PELV W/ CM
2 of 4 series · 11 of 46 positions shown, 12 images · IV contrast (iopamidol)
Comparison: Prior CT of the abdomen and pelvis 09/20/2019

CLINICAL DATA: 63-year-old female with a history of perforated
appendicitis and abscess formation. She underwent placement of a
CT-guided drainage catheter on 09/22/2019. She presents today for
follow-up evaluation.

EXAM:
CT ABDOMEN AND PELVIS WITH CONTRAST
TECHNIQUE: Multidetector CT imaging of the abdomen and pelvis was performed
using the standard protocol following bolus administration of
intravenous contrast.
CONTRAST:  100mL [id number] IOPAMIDOL (MWZNL4-L88) INJECTION 61%

[Series 2: abd pelvis 5.00 br40 s3 axial · axial · 0.52mm/px · z∈[+1163,+1518]mm · 8 of 85 slices shown, 9 images]
[im 7/85  soft-tissue]
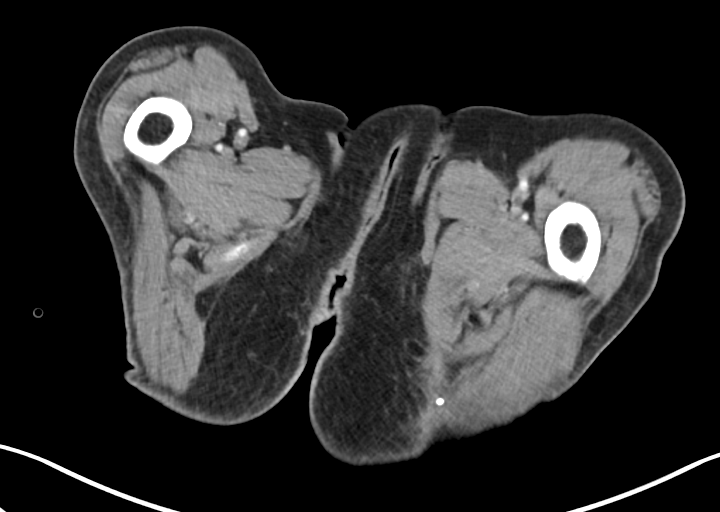
[im 7/85  bone]
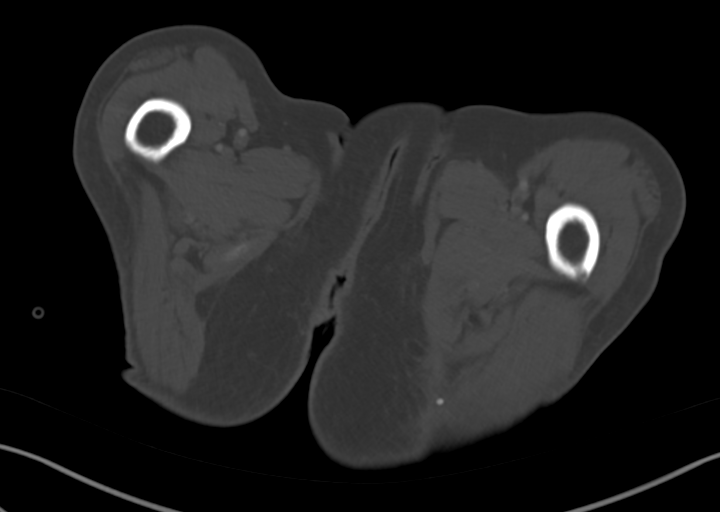
[im 17/85  soft-tissue]
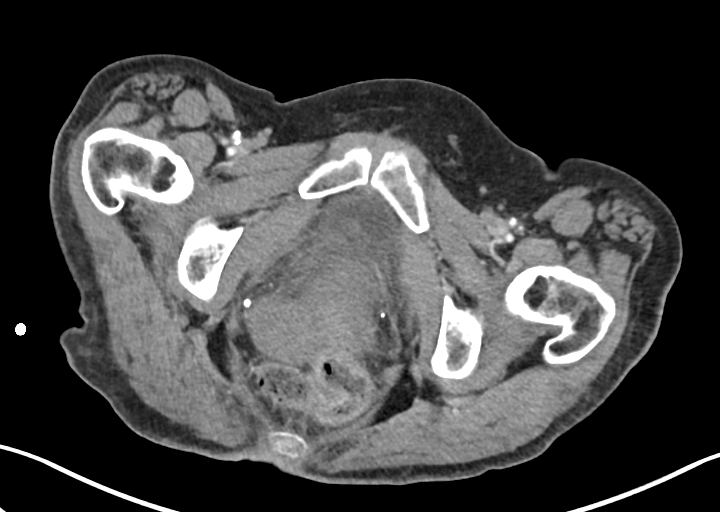
[im 27/85  soft-tissue]
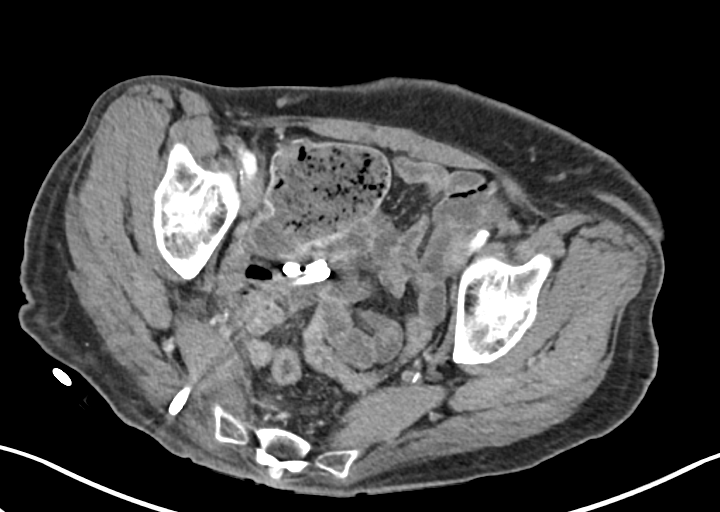
[im 37/85  soft-tissue]
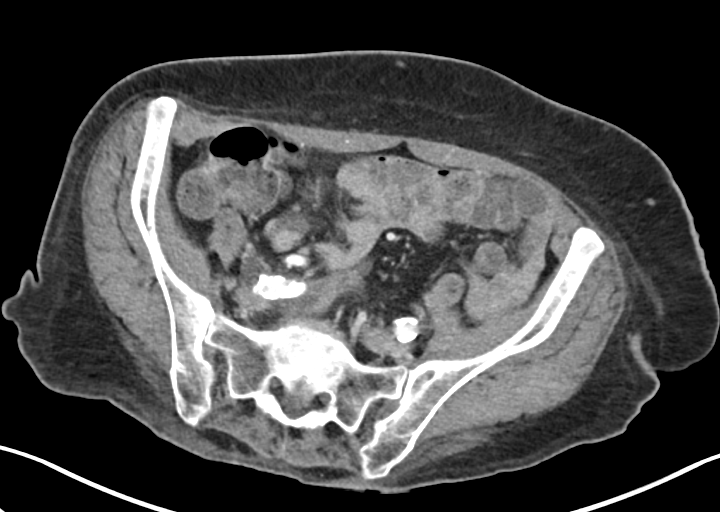
[im 48/85  soft-tissue]
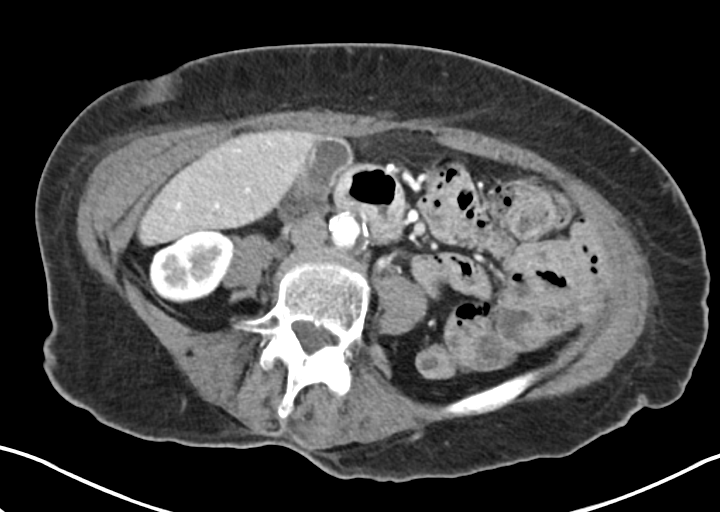
[im 58/85  soft-tissue]
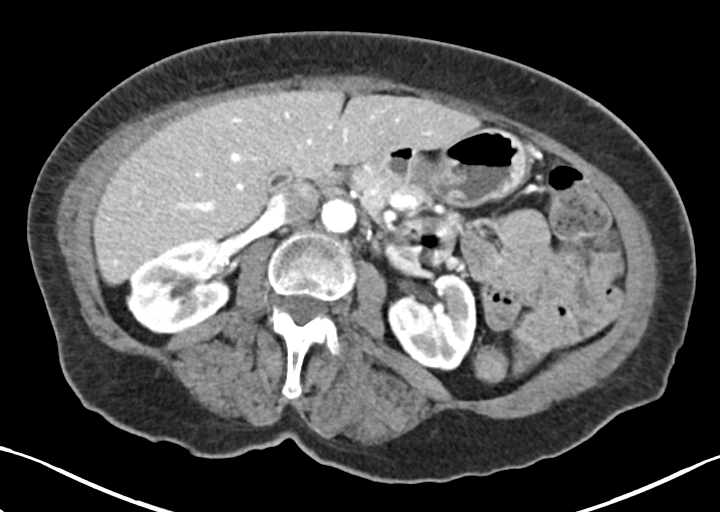
[im 68/85  soft-tissue]
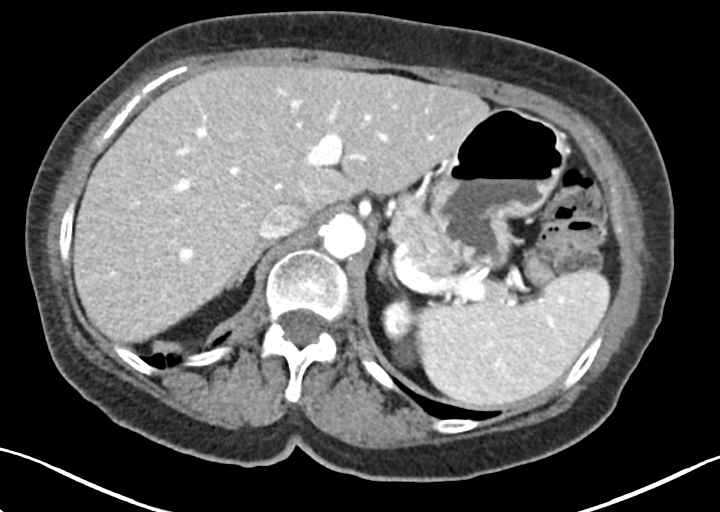
[im 78/85  soft-tissue]
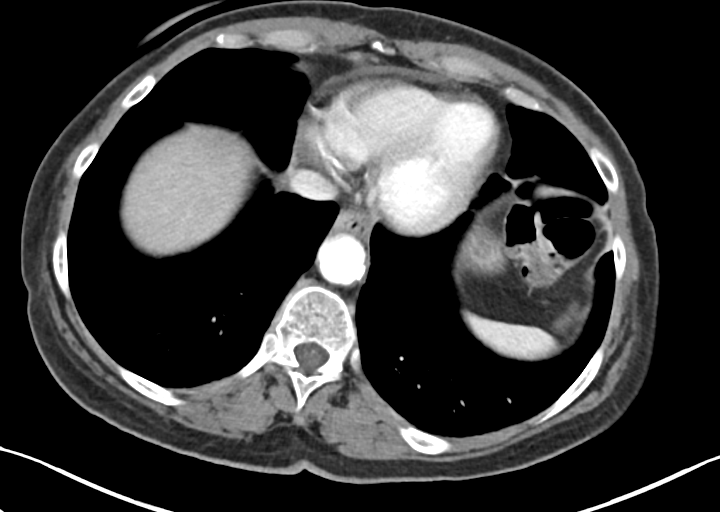

[Series 6: abd pelvis 2.00 br40 s3 cor · coronal · 0.71mm/px · 3 of 134 slices shown]
[im 45/134  soft-tissue]
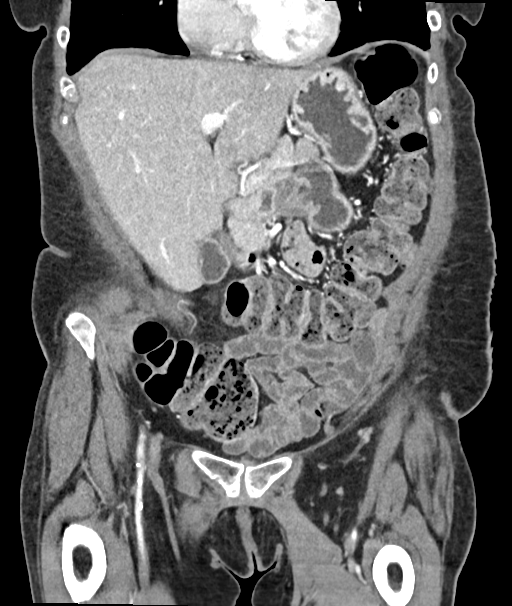
[im 60/134  soft-tissue]
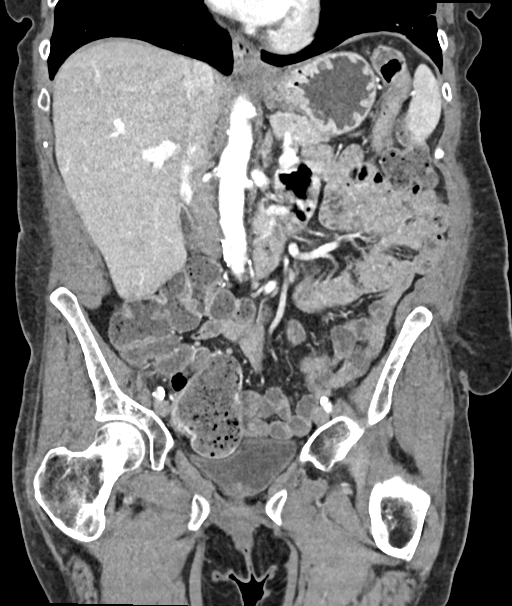
[im 74/134  soft-tissue]
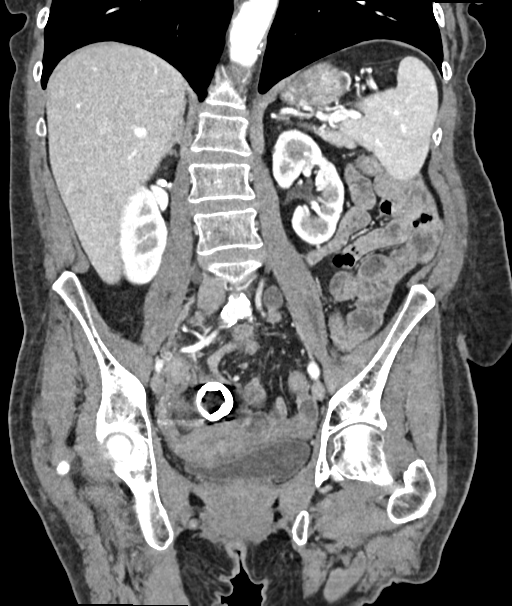

[11 of 46 positions shown; findings below may reference images not displayed]

FINDINGS: Lower chest: A 5 mm subpleural pulmonary nodule is identified in the
posterior periphery of the right lower lobe (image 5 series 4). On
the prior study, this was obscured by surrounding a dependent
atelectasis. Minimal linear scarring in the anterior inferior left
lower lobe. Trace dependent atelectasis. Linear scarring versus
atelectasis in the inferior and medial right lower lobe. The
visualized cardiac structures are normal in size. Unremarkable
distal thoracic esophagus.

Hepatobiliary: Normal hepatic contour and morphology. No discrete
hepatic lesion. Peripherally dense stones are present in the
gallbladder lumen. No biliary ductal dilatation.

Pancreas: Unremarkable. No pancreatic ductal dilatation or
surrounding inflammatory changes.

Spleen: Normal in size without focal abnormality.

Adrenals/Urinary Tract: Normal adrenal glands. No evidence of
hydronephrosis, nephrolithiasis or enhancing renal mass.
Circumscribed water attenuation cystic lesion exophytic from the
upper pole of the left kidney consistent with a simple cyst. The
ureters and bladder are unremarkable.

Stomach/Bowel: No focal bowel wall thickening or evidence of
obstruction.

Vascular/Lymphatic: Atherosclerotic calcifications visualized
throughout the aorta and branch arteries. No evidence of aneurysm.

Reproductive: Uterus and bilateral adnexa are unremarkable.

Other: Right transgluteal approach drainage catheter passing through
the central aspect of the right piriformis muscle. No evidence of
arterial injury or other complication. The drainage catheter appears
to be in good position adjacent to the appendix which remains
dilated. No significant measurable residual abscess.

Musculoskeletal: No acute fracture or aggressive appearing lytic or
blastic osseous lesion. Grade 1 anterolisthesis of L4 on L5. No
spondylolysis identified. There is associated multilevel
degenerative disc disease.
IMPRESSION: 1. Well-positioned right transgluteal approach percutaneous drainage
catheter. The catheter lies immediately adjacent to the dilated
appendix.
2. Interval resolution of previously identified abscess. No residual
abscess is present.
3. 5 mm right lower lobe pulmonary nodule. Given patient's prior
smoking history, a follow-up noncontrast CT scan of the chest in 12
months is recommended. This recommendation follows the consensus
statement: Guidelines for Management of Incidental Pulmonary Nodules
Detected on CT Images: From the [HOSPITAL] 5401; Radiology
4. Grade 1 anterolisthesis of L4 on L5 with associated degenerative
disc disease.

## 2022-03-15 ENCOUNTER — Other Ambulatory Visit: Payer: Self-pay | Admitting: Family Medicine

## 2022-03-15 DIAGNOSIS — I10 Essential (primary) hypertension: Secondary | ICD-10-CM

## 2022-03-15 NOTE — Telephone Encounter (Signed)
Requested Prescriptions  Pending Prescriptions Disp Refills  . losartan (COZAAR) 50 MG tablet [Pharmacy Med Name: LOSARTAN '50MG'$  TABLETS] 90 tablet     Sig: TAKE 1 TABLET(50 MG) BY MOUTH DAILY     Cardiovascular:  Angiotensin Receptor Blockers Failed - 03/15/2022 10:41 AM      Failed - Valid encounter within last 6 months    Recent Outpatient Visits          1 year ago Pure hypercholesterolemia   [place] Pickard, Cammie Mcgee, MD   1 year ago Benign essential HTN   [place] Susy Frizzle, MD   2 years ago Hyperlipidemia, unspecified hyperlipidemia type   [place] Pickard, Cammie Mcgee, MD   2 years ago Essential hypertension   [place], Cammie Mcgee, MD   2 years ago Elevated cholesterol   [place] Denn[place] Schaumann, Cammie Mcgee, MD             Passed - Cr in normal range and within 180 days    Creat  Date Value Ref Range Status  02/26/2022 0.91 0.50 - 1.05 mg/dL Final   Creatinine, Urine  Date Value Ref Range Status  09/20/2019 43.73 mg/dL Final    Comment:    Performed at [facility], [address], [place], [place] [address]         Passed - K in normal range and within 180 days    Potassium  Date Value Ref Range Status  02/26/2022 4.9 3.5 - 5.3 mmol/L Final         Passed - Patient is not pregnant      Passed - Last BP in normal range    BP Readings from Last 1 Encounters:  02/26/22 128/72

## 2022-03-28 ENCOUNTER — Encounter: Payer: Self-pay | Admitting: Family Medicine

## 2022-04-13 ENCOUNTER — Other Ambulatory Visit: Payer: Self-pay | Admitting: Family Medicine

## 2022-04-15 NOTE — Telephone Encounter (Signed)
Requested Prescriptions  Pending Prescriptions Disp Refills   rosuvastatin (CRESTOR) 20 MG tablet [Pharmacy Med Name: ROSUVASTATIN '20MG'$  TABLETS] 90 tablet 3    Sig: TAKE 1 TABLET(20 MG) BY MOUTH DAILY     Cardiovascular:  Antilipid - Statins 2 Failed - 04/13/2022 12:47 PM      Failed - Valid encounter within last 12 months    Recent Outpatient Visits           1 year ago Pure hypercholesterolemia   [place] Pickard, Cammie Mcgee, MD   1 year ago Benign essential HTN   [place] Susy Frizzle, MD   2 years ago Hyperlipidemia, unspecified hyperlipidemia type   [place] Pickard, Cammie Mcgee, MD   2 years ago Essential hypertension   [place], Cammie Mcgee, MD   3 years ago Elevated cholesterol   [place] Dennard Schaumann, Cammie Mcgee, MD              Failed - Lipid Panel in normal range within the last 12 months    Cholesterol  Date Value Ref Range Status  02/26/2022 135 <200 mg/dL Final   LDL Cholesterol (Calc)  Date Value Ref Range Status  02/26/2022 71 mg/dL (calc) Final    Comment:    Reference range: <100 . Desirable range <100 mg/dL for primary prevention;   <70 mg/dL for patients with CHD or diabetic patients  with > or = 2 CHD risk factors. Marland Kitchen LDL-C is now calculated using the Martin-Hopkins  calculation, which is a validated novel method providing  better accuracy than the Friedewald equation in the  estimation of LDL-C.  Cresenciano Genre et al. Annamaria Helling. 7414;239(53): 2061-2068  (http://education.[facility].com/faq/FAQ164)    HDL  Date Value Ref Range Status  02/26/2022 44 (L) > OR = 50 mg/dL Final   Triglycerides  Date Value Ref Range Status  02/26/2022 113 <150 mg/dL Final         Passed - Cr in normal range and within 360 days    Creat  Date Value Ref Range Status  02/26/2022 0.91 0.50 - 1.05 mg/dL Final   Creatinine, Urine  Date Value Ref Range Status   09/20/2019 43.73 mg/dL Final    Comment:    Performed at [facility], [address], [place], [place] [address]         Passed - Patient is not pregnant

## 2022-08-13 ENCOUNTER — Other Ambulatory Visit: Payer: Self-pay | Admitting: Family Medicine

## 2022-08-13 DIAGNOSIS — I10 Essential (primary) hypertension: Secondary | ICD-10-CM

## 2022-08-13 NOTE — Telephone Encounter (Signed)
Unable to refill per protocol, Rx request is too soon.  Requested Prescriptions  Pending Prescriptions Disp Refills   losartan (COZAAR) 50 MG tablet 90 tablet 1     Cardiovascular:  Angiotensin Receptor Blockers Failed - 08/13/2022 10:38 AM      Failed - Valid encounter within last 6 months    Recent Outpatient Visits           1 year ago Pure hypercholesterolemia   [place] Pickard, Cammie Mcgee, MD   1 year ago Benign essential HTN   [place] Susy Frizzle, MD   2 years ago Hyperlipidemia, unspecified hyperlipidemia type   [place] Pickard, Cammie Mcgee, MD   3 years ago Essential hypertension   [place], Cammie Mcgee, MD   3 years ago Elevated cholesterol   [place] Denn[place] Schaumann, Cammie Mcgee, MD              Passed - Cr in normal range and within 180 days    Creat  Date Value Ref Range Status  02/26/2022 0.91 0.50 - 1.05 mg/dL Final   Creatinine, Urine  Date Value Ref Range Status  09/20/2019 43.73 mg/dL Final    Comment:    Performed at [facility], [address], [place], [place] [address]         Passed - K in normal range and within 180 days    Potassium  Date Value Ref Range Status  02/26/2022 4.9 3.5 - 5.3 mmol/L Final         Passed - Patient is not pregnant      Passed - Last BP in normal range    BP Readings from Last 1 Encounters:  02/26/22 128/72

## 2022-08-13 NOTE — Telephone Encounter (Signed)
Prescription Request  08/13/2022  LOV: 02/26/2022  What is the name of the medication or equipment?   losartan (COZAAR) 50 MG tablet  **90 DAY SCRIPT REQUESTED**  Have you contacted your pharmacy to request a refill? Yes   Which pharmacy would you like this sent to?   [facility] [place], [place] [address] Phone:  [phone] Fax:      [phone]  Patient notified that their request is being sent to the clinical staff for review and that they should receive a response within 2 business days.   Please advise pharmacist

## 2022-08-15 ENCOUNTER — Telehealth: Payer: Self-pay | Admitting: Family Medicine

## 2022-08-15 ENCOUNTER — Other Ambulatory Visit: Payer: Self-pay

## 2022-08-15 DIAGNOSIS — I10 Essential (primary) hypertension: Secondary | ICD-10-CM

## 2022-08-15 MED ORDER — LOSARTAN POTASSIUM 50 MG PO TABS: ORAL_TABLET | ORAL | 1 refills | Status: DC

## 2022-08-15 NOTE — Telephone Encounter (Signed)
Prescription Request  08/15/2022  LOV: 02/26/2022  What is the name of the medication or equipment?   losartan (COZAAR) 50 MG tablet [id number]  **PATIENT ONLY HAS 3 PILLS LEFT** **THIS IS THE PATIENT'S NEW PHARMACY. HER INSURANCE COMPANY MADE HER SWITCH FROM [place]. [facility] SCRIPT FOR A 1 MONTH SUPPLY; THEY TOLD HER TO CALL TO REQUEST A NEW SCRIPT FOR A 90 DAY SUPPLY**  Have you contacted your pharmacy to request a refill? Yes   Which pharmacy would you like this sent to?  [id number] [place] [address] [address]Phone: [phone]: [phone]   Patient notified that their request is being sent to the clinical staff for review and that they should receive a response within 2 business days.   Please advise patient at [phone] Patient available any time today. After today, patient requesting a call back after 2pm.

## 2023-02-08 ENCOUNTER — Other Ambulatory Visit: Payer: Self-pay | Admitting: Family Medicine

## 2023-02-08 DIAGNOSIS — I10 Essential (primary) hypertension: Secondary | ICD-10-CM

## 2023-02-11 NOTE — Telephone Encounter (Signed)
Requested by [facility]. Last OV 02/26/22. No future visit scheduled at this time.  Requested Prescriptions  Pending Prescriptions Disp Refills   losartan (COZAAR) 50 MG tablet [Pharmacy Med Name: LOSARTAN POTASSIUM 50 MG TAB] 90 tablet 0    Sig: TAKE 1 TABLET BY MOUTH EVERY DAY     Cardiovascular:  Angiotensin Receptor Blockers Failed - 02/08/2023  8:27 AM      Failed - Cr in normal range and within 180 days    Creat  Date Value Ref Range Status  02/26/2022 0.91 0.50 - 1.05 mg/dL Final   Creatinine, Urine  Date Value Ref Range Status  09/20/2019 43.73 mg/dL Final    Comment:    Performed at [facility], [address], [place], [place] [address]         Failed - K in normal range and within 180 days    Potassium  Date Value Ref Range Status  02/26/2022 4.9 3.5 - 5.3 mmol/L Final         Failed - Valid encounter within last 6 months    Recent Outpatient Visits           1 year ago Pure hypercholesterolemia   [facility] Donita Brooks, MD   2 years ago Benign essential HTN   [facility] Donita Brooks, MD   3 years ago Hyperlipidemia, unspecified hyperlipidemia type   [facility] Pickard, Priscille Heidelberg, MD   3 years ago Essential hypertension   [facility] Tanya Nones, Priscille Heidelberg, MD   3 years ago Elevated cholesterol   [facility] Pickard, Priscille Heidelberg, MD              Passed - Patient is not pregnant      Passed - Last BP in normal range    BP Readings from Last 1 Encounters:  02/26/22 128/72

## 2023-03-14 ENCOUNTER — Other Ambulatory Visit: Payer: Self-pay

## 2023-03-14 ENCOUNTER — Other Ambulatory Visit: Payer: Self-pay | Admitting: Family Medicine

## 2023-03-14 DIAGNOSIS — E782 Mixed hyperlipidemia: Secondary | ICD-10-CM

## 2023-03-14 MED ORDER — ROSUVASTATIN CALCIUM 20 MG PO TABS: 20.0000 mg | ORAL_TABLET | Freq: Every day | ORAL | 0 refills | Status: DC

## 2023-03-14 NOTE — Telephone Encounter (Signed)
Prescription Request  03/14/2023  LOV:02/26/2022 Med refill appt: 03/18/2023  What is the name of the medication or equipment?   rosuvastatin (CRESTOR) 20 MG tablet  SIG: take 1 tablet by mouth every day QTY prescribed: 300.0 EA  Have you contacted your pharmacy to request a refill? Yes   Which pharmacy would you like this sent to?  [id number] [place], [place] - [address] AT [facility] [address] [place] [address] Phone: [phone] Fax: [phone]    Patient notified that their request is being sent to the clinical staff for review and that they should receive a response within 2 business days.   Please advise pharmacist.

## 2023-03-14 NOTE — Telephone Encounter (Signed)
Requested Prescriptions  Refused Prescriptions Disp Refills   rosuvastatin (CRESTOR) 20 MG tablet 90 tablet 3     Cardiovascular:  Antilipid - Statins 2 Failed - 03/14/2023  3:33 PM      Failed - Cr in normal range and within 360 days    Creat  Date Value Ref Range Status  02/26/2022 0.91 0.50 - 1.05 mg/dL Final   Creatinine, Urine  Date Value Ref Range Status  09/20/2019 43.73 mg/dL Final    Comment:    Performed at [facility], [address] [place], [place] [address]         Failed - Valid encounter within last 12 months    Recent Outpatient Visits           2 years ago Pure hypercholesterolemia   [facility] Tanya Nones, Priscille Heidelberg, MD   2 years ago Benign essential HTN   [facility] Donita Brooks, MD   3 years ago Hyperlipidemia, unspecified hyperlipidemia type   [facility] Pickard, Priscille Heidelberg, MD   3 years ago Essential hypertension   [facility] Tanya Nones, Priscille Heidelberg, MD   3 years ago Elevated cholesterol   [facility] Tanya Nones, Priscille Heidelberg, MD              Failed - Lipid Panel in normal range within the last 12 months    Cholesterol  Date Value Ref Range Status  02/26/2022 135 <200 mg/dL Final   LDL Cholesterol (Calc)  Date Value Ref Range Status  02/26/2022 71 mg/dL (calc) Final    Comment:    Reference range: <100 . Desirable range <100 mg/dL for primary prevention;   <70 mg/dL for patients with CHD or diabetic patients  with > or = 2 CHD risk factors. Marland Kitchen LDL-C is now calculated using the Martin-Hopkins  calculation, which is a validated novel method providing  better accuracy than the Friedewald equation in the  estimation of LDL-C.  Horald Pollen et al. Lenox Ahr. 6045;409(81): 2061-2068  (http://education.[employer].com/faq/FAQ164)    HDL  Date Value Ref Range Status  02/26/2022 44 (L) > OR = 50 mg/dL Final   Triglycerides  Date Value Ref Range Status  02/26/2022 113  <150 mg/dL Final         Passed - Patient is not pregnant

## 2023-03-18 ENCOUNTER — Encounter: Payer: Self-pay | Admitting: Family Medicine

## 2023-03-18 ENCOUNTER — Ambulatory Visit: Payer: Self-pay | Admitting: Family Medicine

## 2023-03-18 VITALS — BP 126/82 | HR 83 | Temp 97.7°F | Ht 67.0 in | Wt 159.4 lb

## 2023-03-18 DIAGNOSIS — E7800 Pure hypercholesterolemia, unspecified: Secondary | ICD-10-CM

## 2023-03-18 DIAGNOSIS — I10 Essential (primary) hypertension: Secondary | ICD-10-CM

## 2023-03-18 DIAGNOSIS — E782 Mixed hyperlipidemia: Secondary | ICD-10-CM

## 2023-03-18 MED ORDER — LOSARTAN POTASSIUM 50 MG PO TABS: ORAL_TABLET | ORAL | 3 refills | Status: DC

## 2023-03-18 MED ORDER — ROSUVASTATIN CALCIUM 20 MG PO TABS
20.0000 mg | ORAL_TABLET | Freq: Every day | ORAL | 3 refills | Status: DC
Start: 2023-03-18 — End: 2024-03-25

## 2023-03-18 NOTE — Progress Notes (Signed)
Subjective:    Patient ID: Danielle Morris, female    DOB: [birth year], 67 y.o.   MRN: [id number] Patient has a history of hypertension and hyperlipidemia.  She also has a history of akinesis seen on echocardiogram.  She has refused seeing cardiology.  She denies any chest pain shortness of breath or dyspnea on exertion.  She declines a flu shot today.  She declines a COVID shot today.  She declines a mammogram.  She declines a colonoscopy. Past Medical History:  Diagnosis Date   GAD (generalized anxiety disorder)    GERD (gastroesophageal reflux disease)    HLD (hyperlipidemia)    Hypertension    Myocardial infarction (HCC)    akinesis on echo with abnormal EKG-refused cardiology follow up (8/21)   Panic attack    Tobacco abuse    Past Surgical History:  Procedure Laterality Date   APPENDECTOMY     COLON SURGERY     COLONOSCOPY WITH PROPOFOL N/A 11/02/2013   Procedure: COLONOSCOPY WITH PROPOFOL;  Surgeon: West Bali, MD;  Location: [facility];  Service: Endoscopy;  Laterality: N/A;  in cecum @ 1002; cecal withdrawal time- minutes   HEMORRHOID BANDING N/A 11/02/2013   Procedure: HEMORRHOID BANDING;  Surgeon: West Bali, MD;  Location: [facility];  Service: Endoscopy;  Laterality: N/A;   HEMORRHOID SURGERY N/A 11/05/2013   Procedure: EXTENSIVE HEMORRHOIDECTOMY ;  Surgeon: Dalia Heading, MD;  Location: [facility];  Service: General;  Laterality: N/A;   IR RADIOLOGIST EVAL & MGMT  10/05/2019   IR RADIOLOGIST EVAL & MGMT  10/19/2019   IR RADIOLOGIST EVAL & MGMT  11/10/2019   PARTIAL COLECTOMY N/A 11/19/2019   Procedure: PARTIAL COLECTOMY;  Surgeon: Lucretia Roers, MD;  Location: [facility];  Service: General;  Laterality: N/A;   POLYPECTOMY N/A 11/02/2013   Procedure: POLYPECTOMY;  Surgeon: West Bali, MD;  Location: [facility];  Service: Endoscopy;  Laterality: N/A;  sigmoid colon, hepatic flexure, hyperplastic rectal, and rectal polyps   TONSILLECTOMY     age  60   Current Outpatient Medications  on File Prior to Visit  Medication Sig Dispense Refill   aspirin 81 MG EC tablet Take 1 tablet (81 mg total) by mouth daily. Swallow whole. 30 tablet 12   docusate sodium (COLACE) 100 MG capsule Take 100 mg by mouth 2 (two) times daily.     doxylamine, Sleep, (UNISOM) 25 MG tablet Take 25 mg by mouth at bedtime as needed for sleep.     No current facility-administered medications on file prior to visit.   Allergies  Allergen Reactions   Amlodipine Shortness Of Breath, Anxiety and Palpitations   Tramadol     Rushes thru body, miserable, shaking and crying   Hydrocodone Nausea And Vomiting   Lisinopril Other (See Comments)    unknown   Chantix [Varenicline] Nausea Only and Anxiety   Social History   Socioeconomic History   Marital status: Married    Spouse name: Not on file   Number of children: Not on file   Years of education: Not on file   Highest education level: Not on file  Occupational History   Not on file  Tobacco Use   Smoking status: Former    Current packs/day: 0.00    Average packs/day: 0.5 packs/day for 10.0 years (5.0 ttl pk-yrs)    Types: Cigarettes    Start date: 09/08/2009    Quit date: 09/09/2019    Years since quitting: 3.5  Smokeless tobacco: Never   Tobacco comments:    uses E-cigs  Substance and Sexual Activity   Alcohol use: No   Drug use: No   Sexual activity: Not on file  Other Topics Concern   Not on file  Social History Narrative   Entered 03/2014:   Married.    2 Daughters and son in law, and 1 grandchild live with them now.    Keeps another grandchild frequently also.   Does not work outside of house.   Social Determinants of Health   Financial Resource Strain: Not on file  Food Insecurity: Not on file  Transportation Needs: Not on file  Physical Activity: Not on file  Stress: Not on file  Social Connections: Not on file  Intimate Partner Violence: Not on file     Review of Systems  All other systems reviewed and are  negative.      Objective:   Physical Exam Vitals reviewed.  Constitutional:      Appearance: Normal appearance.  Cardiovascular:     Rate and Rhythm: Normal rate and regular rhythm.     Pulses: Normal pulses.     Heart sounds: Normal heart sounds. No murmur heard.    No friction rub. No gallop.  Pulmonary:     Effort: Pulmonary effort is normal. No respiratory distress.     Breath sounds: Normal breath sounds. No stridor. No wheezing, rhonchi or rales.  Abdominal:     General: Abdomen is flat. Bowel sounds are normal.     Palpations: Abdomen is soft.  Musculoskeletal:     Right lower leg: No edema.     Left lower leg: No edema.  Neurological:     Mental Status: She is alert.           Assessment & Plan:  Pure hypercholesterolemia - Plan: CBC with Differential/Platelet, COMPLETE METABOLIC PANEL WITH GFR, Lipid panel  Essential hypertension - Plan: losartan (COZAAR) 50 MG tablet  Mixed hyperlipidemia - Plan: rosuvastatin (CRESTOR) 20 MG tablet Patient declines a flu shot, COVID shot, shingles shot.  She declines a mammogram.  She declines a colonoscopy.  Blood pressure today is excellent.  I will check a CBC a CMP and a lipid panel.  Ideally I would like to see her LDL cholesterol less than 70.  Regular anticipatory guidance is provided.  Patient states that she is no longer smoking.  I congratulated her on this.

## 2023-03-19 LAB — CBC WITH DIFFERENTIAL/PLATELET
Absolute Monocytes: 372 {cells}/uL (ref 200–950)
Basophils Absolute: 41 {cells}/uL (ref 0–200)
Basophils Relative: 0.7 %
Eosinophils Absolute: 183 {cells}/uL (ref 15–500)
Eosinophils Relative: 3.1 %
HCT: 49 % — ABNORMAL HIGH (ref 35.0–45.0)
Hemoglobin: 16.6 g/dL — ABNORMAL HIGH (ref 11.7–15.5)
Lymphs Abs: 1628 {cells}/uL (ref 850–3900)
MCH: 30.9 pg (ref 27.0–33.0)
MCHC: 33.9 g/dL (ref 32.0–36.0)
MCV: 91.1 fL (ref 80.0–100.0)
MPV: 10.9 fL (ref 7.5–12.5)
Monocytes Relative: 6.3 %
Neutro Abs: 3676 {cells}/uL (ref 1500–7800)
Neutrophils Relative %: 62.3 %
Platelets: 250 Thousand/uL (ref 140–400)
RBC: 5.38 Million/uL — ABNORMAL HIGH (ref 3.80–5.10)
RDW: 12.6 % (ref 11.0–15.0)
Total Lymphocyte: 27.6 %
WBC: 5.9 Thousand/uL (ref 3.8–10.8)

## 2023-03-19 LAB — COMPLETE METABOLIC PANEL WITH GFR
AG Ratio: 1.3 (calc) (ref 1.0–2.5)
ALT: 17 U/L (ref 6–29)
AST: 18 U/L (ref 10–35)
Albumin: 4.8 g/dL (ref 3.6–5.1)
Alkaline phosphatase (APISO): 82 U/L (ref 37–153)
BUN: 15 mg/dL (ref 7–25)
CO2: 28 mmol/L (ref 20–32)
Calcium: 10.5 mg/dL — ABNORMAL HIGH (ref 8.6–10.4)
Chloride: 99 mmol/L (ref 98–110)
Creat: 0.93 mg/dL (ref 0.50–1.05)
Globulin: 3.8 g/dL — ABNORMAL HIGH (ref 1.9–3.7)
Glucose, Bld: 101 mg/dL — ABNORMAL HIGH (ref 65–99)
Potassium: 4.8 mmol/L (ref 3.5–5.3)
Sodium: 137 mmol/L (ref 135–146)
Total Bilirubin: 0.7 mg/dL (ref 0.2–1.2)
Total Protein: 8.6 g/dL — ABNORMAL HIGH (ref 6.1–8.1)
eGFR: 69 mL/min/{1.73_m2} (ref >=60)

## 2023-03-19 LAB — LIPID PANEL
Cholesterol: 164 mg/dL
HDL: 44 mg/dL — ABNORMAL LOW
LDL Cholesterol (Calc): 97 mg/dL
Non-HDL Cholesterol (Calc): 120 mg/dL
Total CHOL/HDL Ratio: 3.7 (calc)
Triglycerides: 125 mg/dL

## 2023-10-09 ENCOUNTER — Ambulatory Visit
Admission: EM | Admit: 2023-10-09 | Discharge: 2023-10-09 | Disposition: A | Discharge status: Home / Self Care | Attending: Nurse Practitioner | Admitting: Nurse Practitioner

## 2023-10-09 ENCOUNTER — Encounter: Payer: Self-pay | Admitting: Emergency Medicine

## 2023-10-09 DIAGNOSIS — H0012 Chalazion right lower eyelid: Secondary | ICD-10-CM

## 2023-10-09 MED ORDER — DOXYCYCLINE HYCLATE 100 MG PO TABS: 100.0000 mg | ORAL_TABLET | Freq: Two times a day (BID) | ORAL | 0 refills | Status: AC

## 2023-10-09 NOTE — Discharge Instructions (Signed)
 Take medication as prescribed. May take over-the-counter Tylenol  as needed for pain or discomfort. Apply warm compresses to the right eye to help with pain or discomfort.  You may apply cool compresses to help with pain or swelling. Avoid rubbing or manipulating the eye while symptoms persist. You may continue using over-the-counter eyedrops such as Clear Eyes or Visine to keep the eye moist and lubricated. If symptoms fail to improve with this treatment, you may follow-up with your primary care physician or with ophthalmology for further evaluation. Follow-up as needed.

## 2023-10-09 NOTE — ED Provider Notes (Signed)
 [facility]    CSN: [id number] Arrival date & time: 10/09/23  1618      History   Chief Complaint No chief complaint on file.   HPI Danielle Morris is a 68 y.o. female.   The history is provided by the patient.   Patient presents with a 1 week history of right eyelid swelling and redness.  Patient states symptoms initially started in the upper eyelid, but over the past several days, have transitioned down to her lower right eyelid.  Patient denies pain, foul-smelling drainage, visual changes, or conjunctival symptoms.  States that she has been using over-the-counter eyedrops for her symptoms.  States that she uses reading glasses only.  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   Abscess of appendix    Hyponatremia 09/21/2019   Perforated appendix 09/20/2019   Abdominal pain 09/20/2019   Nausea and vomiting 09/20/2019   HLD (hyperlipidemia)    Tobacco abuse    GAD (generalized anxiety disorder)    Hyperlipemia 10/27/2017   Generalized anxiety disorder 09/27/2013   Panic disorder 09/27/2013   Smoker 08/25/2013   Hypertension     Past Surgical History:  Procedure Laterality Date   APPENDECTOMY     COLON SURGERY     COLONOSCOPY WITH PROPOFOL  N/A 11/02/2013   Procedure: COLONOSCOPY WITH PROPOFOL ;  Surgeon: Alyce Jubilee, MD;  Location: [facility];  Service: Endoscopy;  Laterality: N/A;  in cecum @ 1002; cecal withdrawal time- minutes   HEMORRHOID BANDING N/A 11/02/2013   Procedure: HEMORRHOID  BANDING;  Surgeon: Alyce Jubilee, MD;  Location: [facility];  Service: Endoscopy;  Laterality: N/A;   HEMORRHOID SURGERY N/A 11/05/2013   Procedure: EXTENSIVE HEMORRHOIDECTOMY ;  Surgeon: Beau Bound, MD;  Location: [facility];  Service: General;  Laterality: N/A;   IR RADIOLOGIST EVAL & MGMT  10/05/2019   IR RADIOLOGIST EVAL & MGMT  10/19/2019   IR RADIOLOGIST EVAL & MGMT  11/10/2019   PARTIAL COLECTOMY N/A 11/19/2019   Procedure: PARTIAL COLECTOMY;  Surgeon: Awilda Bogus, MD;  Location: [facility];  Service: General;  Laterality: N/A;   POLYPECTOMY N/A 11/02/2013   Procedure: POLYPECTOMY;  Surgeon: Alyce Jubilee, MD;  Location: [facility];  Service: Endoscopy;  Laterality: N/A;  sigmoid colon, hepatic flexure, hyperplastic rectal, and rectal polyps   TONSILLECTOMY     age  68    OB History     Gravida  3   Para  3   Term  3   Preterm      AB      Living  3      SAB      IAB      Ectopic      Multiple      Live Births               Home Medications    Prior to Admission medications   Medication Sig Start Date End Date Taking?  Authorizing Provider  aspirin  81 MG EC tablet Take 1 tablet (81 mg total) by mouth daily. Swallow whole. 12/04/20   Austine Lefort, MD  docusate sodium (COLACE) 100 MG capsule Take 100 mg by mouth 2 (two) times daily.    [provider]  doxylamine , Sleep, (UNISOM ) 25 MG tablet Take 25 mg by mouth at bedtime as needed for sleep.    [provider]  losartan  (COZAAR ) 50 MG tablet TAKE 1 TABLET BY MOUTH EVERY DAY 03/18/23   Austine Lefort, MD  rosuvastatin  (CRESTOR ) 20 MG tablet Take 1 tablet (20 mg total) by mouth daily. 03/18/23   Austine Lefort, MD    Family History Family History  Problem Relation Age of Onset   Heart disease Father 2       MI--H/O Rheumatic Fever as a child   Hypertension Brother    Hypertension Brother    Diabetes Other     Social History Social History   Tobacco Use   Smoking status:  Former    Current packs/day: 0.00    Average packs/day: 0.5 packs/day for 10.0 years (5.0 ttl pk-yrs)    Types: Cigarettes    Start date: 09/08/2009    Quit date: 09/09/2019    Years since quitting: 4.0   Smokeless tobacco: Never   Tobacco comments:    uses E-cigs  Substance Use Topics   Alcohol use: No   Drug use: No     Allergies   Amlodipine , Tramadol , Hydrocodone , Lisinopril , and Chantix [varenicline]   Review of Systems Review of Systems Per HPI  Physical Exam Triage Vital Signs ED Triage Vitals  Encounter Vitals Group     BP 10/09/23 1626 (!) 191/108     Systolic BP Percentile --      Diastolic BP Percentile --      Pulse Rate 10/09/23 1626 94     Resp 10/09/23 1626 18     Temp 10/09/23 1626 97.9 F (36.6 C)     Temp Source 10/09/23 1626 Oral     SpO2 10/09/23 1626 96 %     Weight --      Height --      Head Circumference --      Peak Flow --      Pain Score 10/09/23 1628 2     Pain Loc --      Pain Education --      Exclude from Growth Chart --    No data found.  Updated Vital Signs BP (!) 175/109 (BP Location: Right Arm)   Pulse 94   Temp 97.9 F (36.6 C) (Oral)   Resp 18   SpO2 96%   Visual Acuity Right Eye Distance:   Left Eye Distance:   Bilateral Distance:    Right Eye Near:   Left Eye Near:    Bilateral Near:     Physical Exam Vitals and nursing note reviewed.  Constitutional:      General: She is not in acute distress.    Appearance: Normal appearance.  HENT:     Head: Normocephalic.  Eyes:     General: Vision grossly intact. No visual field deficit.       Right eye: No foreign body, discharge or hordeolum.     Extraocular Movements: Extraocular movements intact.     Right eye: Normal extraocular motion and no nystagmus.     Conjunctiva/sclera: Conjunctivae normal.     Right eye: Right conjunctiva is not injected. No chemosis, exudate  or hemorrhage.    Pupils: Pupils are equal, round, and reactive to light.     Comments:  Chalazion noted to right lower eyelid. Lower periorbital swelling present.  Pulmonary:     Effort: Pulmonary effort is normal.  Musculoskeletal:     Cervical back: Normal range of motion.  Skin:    General: Skin is warm and dry.  Neurological:     General: No focal deficit present.     Mental Status: She is alert and oriented to person, place, and time.  Psychiatric:        Mood and Affect: Mood normal.        Behavior: Behavior normal.      UC Treatments / Results  Labs (all labs ordered are listed, but only abnormal results are displayed) Labs Reviewed - No data to display  EKG   Radiology No results found.  Procedures Procedures (including critical care time)  Medications Ordered in UC Medications - No data to display  Initial Impression / Assessment and Plan / UC Course  I have reviewed the triage vital signs and the nursing notes.  Pertinent labs & imaging results that were available during my care of the patient were reviewed by me and considered in my medical decision making (see chart for details).  On exam, patient with chalazion to the right lower eyelid.  Will treat empirically with doxycycline  100 mg twice daily for the next 5 days.  Supportive care recommendations were provided and discussed with the patient to include warm or cool compresses, continuing over-the-counter eyedrops, and avoiding rubbing or manipulating the eyes while symptoms persist.  Patient was given indications regarding follow-up.  Patient was in agreement with this plan of care and verbalized understanding.  All questions were answered.  Patient stable for discharge.  Final Clinical Impressions(s) / UC Diagnoses   Final diagnoses:  None   Discharge Instructions   None    ED Prescriptions   None    PDMP not reviewed this encounter.   Hardy Lia, NP 10/09/23 1705

## 2023-10-09 NOTE — ED Triage Notes (Addendum)
 Right Eye lid redness and swelling x 1 week

## 2024-02-18 ENCOUNTER — Other Ambulatory Visit: Payer: Self-pay | Admitting: Family Medicine

## 2024-02-18 DIAGNOSIS — E782 Mixed hyperlipidemia: Secondary | ICD-10-CM

## 2024-03-25 ENCOUNTER — Ambulatory Visit: Admitting: Family Medicine

## 2024-03-25 ENCOUNTER — Encounter: Payer: Self-pay | Admitting: Family Medicine

## 2024-03-25 DIAGNOSIS — E782 Mixed hyperlipidemia: Secondary | ICD-10-CM | POA: Diagnosis not present

## 2024-03-25 DIAGNOSIS — I10 Essential (primary) hypertension: Secondary | ICD-10-CM

## 2024-03-25 MED ORDER — HYDROCHLOROTHIAZIDE 25 MG PO TABS: 25.0000 mg | ORAL_TABLET | Freq: Every day | ORAL | 3 refills | Status: AC

## 2024-03-25 MED ORDER — NEBIVOLOL HCL 10 MG PO TABS: 10.0000 mg | ORAL_TABLET | Freq: Every day | ORAL | 3 refills | Status: AC

## 2024-03-25 MED ORDER — LOSARTAN POTASSIUM 50 MG PO TABS
ORAL_TABLET | ORAL | 3 refills | Status: AC
Start: 2024-03-25 — End: ?

## 2024-03-25 MED ORDER — ROSUVASTATIN CALCIUM 20 MG PO TABS: 20.0000 mg | ORAL_TABLET | Freq: Every day | ORAL | 3 refills | Status: AC

## 2024-03-25 NOTE — Progress Notes (Signed)
 Subjective:    Patient ID: Danielle Morris, female    DOB: [birth year], 68 y.o.   MRN: [id number] Patient has a history of hypertension and hyperlipidemia.  She also has a history of akinesis seen on echocardiogram.  She has refused seeing cardiology.  She denies any chest pain shortness of breath or dyspnea on exertion.  Her blood pressure is extremely high.  My nurse found it to be 180/120.  I rechecked her blood pressure and found it to be 200/120.  However, the patient has severe whitecoat syndrome.  She is very anxious to be here.  She has agoraphobia and hates to leave the house.  However she has not been monitoring her blood pressure at home.  She denies any headache or blurry vision.  She denies oliguria Past Medical History:  Diagnosis Date   GAD (generalized anxiety disorder)    GERD (gastroesophageal reflux disease)    HLD (hyperlipidemia)    Hypertension    Myocardial infarction (HCC)    akinesis on echo with abnormal EKG-refused cardiology follow up (8/21)   Panic attack    Tobacco abuse    Past Surgical History:  Procedure Laterality Date   APPENDECTOMY     COLON SURGERY     COLONOSCOPY WITH PROPOFOL  N/A 11/02/2013   Procedure: COLONOSCOPY WITH PROPOFOL ;  Surgeon: Margo LITTIE Haddock, MD;  Location: [facility];  Service: Endoscopy;  Laterality: N/A;  in cecum @ 1002; cecal withdrawal time- minutes   HEMORRHOID BANDING N/A 11/02/2013   Procedure: HEMORRHOID BANDING;  Surgeon: Margo LITTIE Haddock, MD;  Location: [facility];  Service: Endoscopy;  Laterality: N/A;   HEMORRHOID SURGERY N/A 11/05/2013   Procedure: EXTENSIVE HEMORRHOIDECTOMY ;  Surgeon: Oneil DELENA Budge, MD;  Location: [facility];  Service: General;  Laterality: N/A;   IR RADIOLOGIST EVAL & MGMT  10/05/2019   IR RADIOLOGIST EVAL & MGMT  10/19/2019   IR RADIOLOGIST EVAL & MGMT  11/10/2019   PARTIAL COLECTOMY N/A 11/19/2019   Procedure: PARTIAL COLECTOMY;  Surgeon: Kallie Manuelita BROCKS, MD;  Location: [facility];  Service: General;  Laterality: N/A;    POLYPECTOMY N/A 11/02/2013   Procedure: POLYPECTOMY;  Surgeon: Margo LITTIE Haddock, MD;  Location: [facility];  Service: Endoscopy;  Laterality: N/A;  sigmoid colon, hepatic flexure, hyperplastic rectal, and rectal polyps   TONSILLECTOMY     age  68   Current Outpatient Medications on File Prior to Visit  Medication Sig Dispense Refill   aspirin  81 MG EC tablet Take 1 tablet (81 mg total) by mouth daily. Swallow whole. 30 tablet 12   docusate sodium (COLACE) 100 MG capsule Take 100 mg by mouth 2 (two) times daily.     doxylamine , Sleep, (UNISOM ) 25 MG tablet Take 25 mg by mouth at bedtime as needed for sleep.     No current facility-administered medications on file prior to visit.   Allergies  Allergen Reactions   Amlodipine  Shortness Of Breath, Anxiety and Palpitations   Tramadol      Rushes thru body, miserable, shaking and crying   Hydrocodone  Nausea And Vomiting   Lisinopril  Other (See Comments)    unknown   Chantix [Varenicline] Nausea Only and Anxiety   Social History   Socioeconomic History   Marital status: Married    Spouse name: Not on file   Number of children: Not on file   Years of education: Not on file   Highest education level: Not on file  Occupational History   Not on file  Tobacco Use   Smoking status: Former    Current packs/day: 0.00    Average packs/day: 0.5 packs/day for 10.0 years (5.0 ttl pk-yrs)    Types: Cigarettes    Start date: 09/08/2009    Quit date: 09/09/2019    Years since quitting: 4.5   Smokeless tobacco: Never   Tobacco comments:    uses E-cigs  Substance and Sexual Activity   Alcohol use: No   Drug use: No   Sexual activity: Not on file  Other Topics Concern   Not on file  Social History Narrative   Entered 03/2014:   Married.    2 Daughters and son in law, and 1 grandchild live with them now.    Keeps another grandchild frequently also.   Does not work outside of house.   Social Drivers of Corporate investment banker Strain: Not on  file  Food Insecurity: Not on file  Transportation Needs: Not on file  Physical Activity: Not on file  Stress: Not on file  Social Connections: Not on file  Intimate Partner Violence: Not on file     Review of Systems  All other systems reviewed and are negative.      Objective:   Physical Exam Vitals reviewed.  Constitutional:      Appearance: Normal appearance.  Cardiovascular:     Rate and Rhythm: Normal rate and regular rhythm.     Pulses: Normal pulses.     Heart sounds: Normal heart sounds. No murmur heard.    No friction rub. No gallop.  Pulmonary:     Effort: Pulmonary effort is normal. No respiratory distress.     Breath sounds: Normal breath sounds. No stridor. No wheezing, rhonchi or rales.  Abdominal:     General: Abdomen is flat. Bowel sounds are normal.     Palpations: Abdomen is soft.  Musculoskeletal:     Right lower leg: No edema.     Left lower leg: No edema.  Neurological:     Mental Status: She is alert.           Assessment & Plan:  Essential hypertension - Plan: losartan  (COZAAR ) 50 MG tablet, Comprehensive metabolic panel with GFR, CBC with Differential/Platelet, Lipid panel  Mixed hyperlipidemia - Plan: rosuvastatin  (CRESTOR ) 20 MG tablet Patient is extremely anxious today.  I believe that her blood pressure is artificially elevated.  I want her to resume losartan  immediately.  However I am going to add Bystolic 10 mg daily to the losartan .  Recheck blood pressure in 2 weeks.  Initially I wrote a prescription for hydrochlorothiazide  however the patient states that she cannot tolerate that.  She previously had a bad reaction to that.  I will check a CBC, a CBC, and a lipid panel.  The patient knows not to fill the prescription for hydrochlorothiazide 

## 2024-03-26 ENCOUNTER — Ambulatory Visit: Payer: Self-pay | Admitting: Family Medicine

## 2024-03-26 LAB — LIPID PANEL
Cholesterol: 150 mg/dL (ref <200)
HDL: 43 mg/dL — ABNORMAL LOW (ref >=50)
LDL Cholesterol (Calc): 84 mg/dL
Non-HDL Cholesterol (Calc): 107 mg/dL (ref <130)
Total CHOL/HDL Ratio: 3.5 (calc) (ref <5.0)
Triglycerides: 133 mg/dL (ref <150)

## 2024-03-26 LAB — COMPREHENSIVE METABOLIC PANEL WITH GFR
AG Ratio: 1.3 (calc) (ref 1.0–2.5)
ALT: 12 U/L (ref 6–29)
AST: 16 U/L (ref 10–35)
Albumin: 4.8 g/dL (ref 3.6–5.1)
Alkaline phosphatase (APISO): 73 U/L (ref 37–153)
BUN: 10 mg/dL (ref 7–25)
CO2: 25 mmol/L (ref 20–32)
Calcium: 10 mg/dL (ref 8.6–10.4)
Chloride: 100 mmol/L (ref 98–110)
Creat: 0.88 mg/dL (ref 0.50–1.05)
Globulin: 3.6 g/dL (ref 1.9–3.7)
Glucose, Bld: 101 mg/dL — ABNORMAL HIGH (ref 65–99)
Potassium: 5.1 mmol/L (ref 3.5–5.3)
Sodium: 135 mmol/L (ref 135–146)
Total Bilirubin: 0.7 mg/dL (ref 0.2–1.2)
Total Protein: 8.4 g/dL — ABNORMAL HIGH (ref 6.1–8.1)
eGFR: 72 mL/min/1.73m2 (ref >=60)

## 2024-03-26 LAB — CBC WITH DIFFERENTIAL/PLATELET
Absolute Lymphocytes: 1421 {cells}/uL (ref 850–3900)
Absolute Monocytes: 433 {cells}/uL (ref 200–950)
Basophils Absolute: 31 {cells}/uL (ref 0–200)
Basophils Relative: 0.5 %
Eosinophils Absolute: 183 {cells}/uL (ref 15–500)
Eosinophils Relative: 3 %
HCT: 50 % — ABNORMAL HIGH (ref 35.0–45.0)
Hemoglobin: 16.8 g/dL — ABNORMAL HIGH (ref 11.7–15.5)
MCH: 30.5 pg (ref 27.0–33.0)
MCHC: 33.6 g/dL (ref 32.0–36.0)
MCV: 90.7 fL (ref 80.0–100.0)
MPV: 10.4 fL (ref 7.5–12.5)
Monocytes Relative: 7.1 %
Neutro Abs: 4032 {cells}/uL (ref 1500–7800)
Neutrophils Relative %: 66.1 %
Platelets: 256 Thousand/uL (ref 140–400)
RBC: 5.51 Million/uL — ABNORMAL HIGH (ref 3.80–5.10)
RDW: 13 % (ref 11.0–15.0)
Total Lymphocyte: 23.3 %
WBC: 6.1 Thousand/uL (ref 3.8–10.8)

## 2024-03-26 NOTE — Progress Notes (Signed)
 Mailbox is not currently accepting voicemails.

## 2024-04-19 ENCOUNTER — Telehealth: Payer: Self-pay

## 2024-04-19 NOTE — Telephone Encounter (Signed)
 Copied from CRM [id number]. Topic: Clinical - Medical Advice >> Apr 19, 2024 12:24 PM Leonette P wrote: Reason for CRM: pt was to call provider back with her BP reading   142/90 131/80 144/85 133/80 141/85 135/80 132/85 134/81 135/87 132/88 134/79 136/84  [phone]  Please advise

## 2024-04-21 NOTE — Telephone Encounter (Signed)
 Attempted to contact pt. No answer. Vm was not set up, no contact made
